# Patient Record
Sex: Female | Born: 1965 | Race: White | Hispanic: No | Marital: Single | Smoking: Current every day smoker
Health system: Southern US, Community
[De-identification: ages and names within clinical notes are randomized; demographics above are authoritative.]

## PROBLEM LIST (undated history)

## (undated) DIAGNOSIS — F419 Anxiety disorder, unspecified: Secondary | ICD-10-CM

## (undated) DIAGNOSIS — N809 Endometriosis, unspecified: Secondary | ICD-10-CM

## (undated) DIAGNOSIS — F329 Major depressive disorder, single episode, unspecified: Secondary | ICD-10-CM

## (undated) DIAGNOSIS — R160 Hepatomegaly, not elsewhere classified: Secondary | ICD-10-CM

## (undated) DIAGNOSIS — M1990 Unspecified osteoarthritis, unspecified site: Secondary | ICD-10-CM

## (undated) DIAGNOSIS — T148XXA Other injury of unspecified body region, initial encounter: Secondary | ICD-10-CM

## (undated) DIAGNOSIS — K602 Anal fissure, unspecified: Secondary | ICD-10-CM

## (undated) DIAGNOSIS — Z720 Tobacco use: Secondary | ICD-10-CM

## (undated) DIAGNOSIS — F410 Panic disorder [episodic paroxysmal anxiety] without agoraphobia: Secondary | ICD-10-CM

## (undated) DIAGNOSIS — E559 Vitamin D deficiency, unspecified: Secondary | ICD-10-CM

## (undated) DIAGNOSIS — E669 Obesity, unspecified: Secondary | ICD-10-CM

## (undated) DIAGNOSIS — G2581 Restless legs syndrome: Secondary | ICD-10-CM

## (undated) DIAGNOSIS — F32A Depression, unspecified: Secondary | ICD-10-CM

## (undated) DIAGNOSIS — R7303 Prediabetes: Secondary | ICD-10-CM

## (undated) DIAGNOSIS — F429 Obsessive-compulsive disorder, unspecified: Secondary | ICD-10-CM

## (undated) DIAGNOSIS — B029 Zoster without complications: Secondary | ICD-10-CM

## (undated) DIAGNOSIS — E782 Mixed hyperlipidemia: Secondary | ICD-10-CM

## (undated) DIAGNOSIS — M722 Plantar fascial fibromatosis: Secondary | ICD-10-CM

## (undated) DIAGNOSIS — E039 Hypothyroidism, unspecified: Secondary | ICD-10-CM

## (undated) HISTORY — DX: Mixed hyperlipidemia: E78.2

## (undated) HISTORY — PX: LEFT OOPHORECTOMY: SHX1961

## (undated) HISTORY — PX: TONSILLECTOMY: SUR1361

## (undated) HISTORY — DX: Unspecified osteoarthritis, unspecified site: M19.90

## (undated) HISTORY — PX: HEMORROIDECTOMY: SUR656

## (undated) HISTORY — DX: Vitamin D deficiency, unspecified: E55.9

## (undated) HISTORY — DX: Anxiety disorder, unspecified: F41.9

## (undated) HISTORY — DX: Panic disorder (episodic paroxysmal anxiety): F41.0

## (undated) HISTORY — DX: Endometriosis, unspecified: N80.9

## (undated) HISTORY — DX: Restless legs syndrome: G25.81

## (undated) HISTORY — DX: Obsessive-compulsive disorder, unspecified: F42.9

## (undated) HISTORY — DX: Anal fissure, unspecified: K60.2

## (undated) HISTORY — DX: Depression, unspecified: F32.A

## (undated) HISTORY — DX: Other injury of unspecified body region, initial encounter: T14.8XXA

## (undated) HISTORY — DX: Prediabetes: R73.03

## (undated) HISTORY — DX: Hepatomegaly, not elsewhere classified: R16.0

## (undated) HISTORY — DX: Major depressive disorder, single episode, unspecified: F32.9

## (undated) HISTORY — DX: Hypothyroidism, unspecified: E03.9

## (undated) HISTORY — DX: Zoster without complications: B02.9

## (undated) HISTORY — DX: Plantar fascial fibromatosis: M72.2

## (undated) HISTORY — DX: Tobacco use: Z72.0

## (undated) HISTORY — PX: CHOLECYSTECTOMY: SHX55

## (undated) HISTORY — DX: Obesity, unspecified: E66.9

---

## 1992-10-13 DIAGNOSIS — K602 Anal fissure, unspecified: Secondary | ICD-10-CM

## 1992-10-13 HISTORY — DX: Anal fissure, unspecified: K60.2

## 1999-06-03 ENCOUNTER — Other Ambulatory Visit: Admission: RE | Admit: 1999-06-03 | Discharge: 1999-06-14 | Payer: Self-pay

## 2000-06-17 ENCOUNTER — Emergency Department (HOSPITAL_COMMUNITY): Admission: EM | Admit: 2000-06-17 | Discharge: 2000-06-17 | Payer: Self-pay | Admitting: Emergency Medicine

## 2000-06-17 ENCOUNTER — Encounter: Payer: Self-pay | Admitting: Emergency Medicine

## 2000-07-03 ENCOUNTER — Ambulatory Visit (HOSPITAL_COMMUNITY): Admission: RE | Admit: 2000-07-03 | Discharge: 2000-07-03 | Payer: Self-pay | Admitting: Internal Medicine

## 2000-07-03 ENCOUNTER — Encounter: Payer: Self-pay | Admitting: Internal Medicine

## 2000-07-15 ENCOUNTER — Other Ambulatory Visit: Admission: RE | Admit: 2000-07-15 | Discharge: 2000-07-15 | Payer: Self-pay | Admitting: Obstetrics & Gynecology

## 2000-11-12 ENCOUNTER — Other Ambulatory Visit: Admission: RE | Admit: 2000-11-12 | Discharge: 2000-11-12 | Payer: Self-pay | Admitting: Obstetrics & Gynecology

## 2001-03-30 ENCOUNTER — Other Ambulatory Visit (HOSPITAL_COMMUNITY): Admission: RE | Admit: 2001-03-30 | Discharge: 2001-04-12 | Payer: Self-pay | Admitting: Psychiatry

## 2002-01-21 ENCOUNTER — Other Ambulatory Visit: Admission: RE | Admit: 2002-01-21 | Discharge: 2002-01-21 | Payer: Self-pay | Admitting: Obstetrics and Gynecology

## 2003-04-10 ENCOUNTER — Other Ambulatory Visit: Admission: RE | Admit: 2003-04-10 | Discharge: 2003-04-10 | Payer: Self-pay | Admitting: Obstetrics & Gynecology

## 2003-05-29 ENCOUNTER — Encounter: Payer: Self-pay | Admitting: Obstetrics & Gynecology

## 2003-05-29 ENCOUNTER — Encounter: Admission: RE | Admit: 2003-05-29 | Discharge: 2003-05-29 | Payer: Self-pay | Admitting: Obstetrics & Gynecology

## 2004-07-03 ENCOUNTER — Other Ambulatory Visit: Admission: RE | Admit: 2004-07-03 | Discharge: 2004-07-03 | Payer: Self-pay | Admitting: Obstetrics & Gynecology

## 2005-09-06 ENCOUNTER — Emergency Department (HOSPITAL_COMMUNITY): Admission: AD | Admit: 2005-09-06 | Discharge: 2005-09-06 | Payer: Self-pay | Admitting: Family Medicine

## 2006-12-25 LAB — HM DEXA SCAN: HM Dexa Scan: NORMAL

## 2007-03-11 ENCOUNTER — Ambulatory Visit: Payer: Self-pay | Admitting: Internal Medicine

## 2007-03-25 ENCOUNTER — Ambulatory Visit: Payer: Self-pay | Admitting: Internal Medicine

## 2007-03-25 HISTORY — PX: COLONOSCOPY: SHX174

## 2007-05-14 LAB — HM COLONOSCOPY: HM Colonoscopy: NORMAL

## 2007-07-14 ENCOUNTER — Ambulatory Visit (HOSPITAL_COMMUNITY): Admission: RE | Admit: 2007-07-14 | Discharge: 2007-07-14 | Payer: Self-pay | Admitting: Internal Medicine

## 2008-04-04 ENCOUNTER — Ambulatory Visit (HOSPITAL_BASED_OUTPATIENT_CLINIC_OR_DEPARTMENT_OTHER): Admission: RE | Admit: 2008-04-04 | Discharge: 2008-04-04 | Payer: Self-pay | Admitting: Obstetrics & Gynecology

## 2008-04-04 ENCOUNTER — Encounter: Payer: Self-pay | Admitting: Obstetrics & Gynecology

## 2008-07-24 ENCOUNTER — Other Ambulatory Visit: Admission: RE | Admit: 2008-07-24 | Discharge: 2008-07-24 | Payer: Self-pay | Admitting: Obstetrics & Gynecology

## 2009-02-15 ENCOUNTER — Ambulatory Visit (HOSPITAL_COMMUNITY): Admission: RE | Admit: 2009-02-15 | Discharge: 2009-02-15 | Payer: Self-pay | Admitting: Internal Medicine

## 2009-10-24 ENCOUNTER — Encounter: Admission: RE | Admit: 2009-10-24 | Discharge: 2009-10-24 | Payer: Self-pay | Admitting: Obstetrics & Gynecology

## 2010-02-18 ENCOUNTER — Observation Stay (HOSPITAL_COMMUNITY): Admission: EM | Admit: 2010-02-18 | Discharge: 2010-02-21 | Payer: Self-pay | Admitting: Emergency Medicine

## 2010-02-18 ENCOUNTER — Ambulatory Visit: Payer: Self-pay | Admitting: Cardiology

## 2010-03-13 ENCOUNTER — Encounter (INDEPENDENT_AMBULATORY_CARE_PROVIDER_SITE_OTHER): Payer: Self-pay | Admitting: Internal Medicine

## 2010-03-13 ENCOUNTER — Inpatient Hospital Stay (HOSPITAL_COMMUNITY): Admission: EM | Admit: 2010-03-13 | Discharge: 2010-03-15 | Payer: Self-pay | Admitting: Emergency Medicine

## 2010-03-13 ENCOUNTER — Ambulatory Visit: Payer: Self-pay | Admitting: Vascular Surgery

## 2010-11-03 ENCOUNTER — Encounter: Payer: Self-pay | Admitting: Obstetrics & Gynecology

## 2010-12-16 ENCOUNTER — Other Ambulatory Visit: Payer: Self-pay | Admitting: Obstetrics & Gynecology

## 2010-12-16 DIAGNOSIS — Z1231 Encounter for screening mammogram for malignant neoplasm of breast: Secondary | ICD-10-CM

## 2010-12-19 ENCOUNTER — Ambulatory Visit (HOSPITAL_COMMUNITY)
Admission: RE | Admit: 2010-12-19 | Discharge: 2010-12-19 | Disposition: A | Discharge status: Home / Self Care | Payer: Medicare Other | Source: Ambulatory Visit | Attending: Obstetrics & Gynecology | Admitting: Obstetrics & Gynecology

## 2010-12-19 ENCOUNTER — Ambulatory Visit (HOSPITAL_COMMUNITY): Payer: Medicare Other

## 2010-12-19 DIAGNOSIS — Z1231 Encounter for screening mammogram for malignant neoplasm of breast: Secondary | ICD-10-CM | POA: Insufficient documentation

## 2010-12-19 LAB — HM MAMMOGRAPHY: HM Mammogram: NEGATIVE

## 2010-12-30 LAB — CBC
Hemoglobin: 12.9 g/dL (ref 12.0–15.0)
MCHC: 34 g/dL (ref 30.0–36.0)
Platelets: 229 10*3/uL (ref 150–400)
WBC: 8.7 10*3/uL (ref 4.0–10.5)

## 2010-12-30 LAB — DIFFERENTIAL
Basophils Absolute: 0.1 10*3/uL (ref 0.0–0.1)
Eosinophils Relative: 13 % — ABNORMAL HIGH (ref 0–5)
Lymphocytes Relative: 36 % (ref 12–46)
Monocytes Absolute: 0.6 10*3/uL (ref 0.1–1.0)

## 2010-12-30 LAB — URINALYSIS, ROUTINE W REFLEX MICROSCOPIC
Glucose, UA: NEGATIVE mg/dL
Glucose, UA: NEGATIVE mg/dL
Nitrite: NEGATIVE
Protein, ur: NEGATIVE mg/dL
Protein, ur: NEGATIVE mg/dL
Specific Gravity, Urine: 1.002 — ABNORMAL LOW (ref 1.005–1.030)
Specific Gravity, Urine: 1.003 — ABNORMAL LOW (ref 1.005–1.030)
Urobilinogen, UA: 0.2 mg/dL (ref 0.0–1.0)
Urobilinogen, UA: 0.2 mg/dL (ref 0.0–1.0)
pH: 7 (ref 5.0–8.0)

## 2010-12-30 LAB — BASIC METABOLIC PANEL
BUN: 3 mg/dL — ABNORMAL LOW (ref 6–23)
BUN: 3 mg/dL — ABNORMAL LOW (ref 6–23)
CO2: 23 mEq/L (ref 19–32)
Calcium: 9 mg/dL (ref 8.4–10.5)
Calcium: 9 mg/dL (ref 8.4–10.5)
Calcium: 9.4 mg/dL (ref 8.4–10.5)
Creatinine, Ser: 0.89 mg/dL (ref 0.4–1.2)
GFR calc Af Amer: >60 mL/min (ref >60)
GFR calc Af Amer: >60 mL/min (ref >60)
GFR calc non Af Amer: >60 mL/min (ref >60)
Glucose, Bld: 94 mg/dL (ref 70–99)
Glucose, Bld: 94 mg/dL (ref 70–99)
Potassium: 4.1 mEq/L (ref 3.5–5.1)

## 2010-12-30 LAB — CARDIAC PANEL(CRET KIN+CKTOT+MB+TROPI)
CK, MB: 1.1 ng/mL (ref 0.3–4.0)
Relative Index: INVALID (ref 0.0–2.5)
Relative Index: INVALID (ref 0.0–2.5)
Total CK: 46 U/L (ref 7–177)
Troponin I: 0.01 ng/mL (ref 0.00–0.06)
Troponin I: 0.02 ng/mL (ref 0.00–0.06)

## 2010-12-30 LAB — LIPID PANEL
HDL: 46 mg/dL (ref >39)
LDL Cholesterol: 119 mg/dL — ABNORMAL HIGH (ref 0–99)
Total CHOL/HDL Ratio: 4 RATIO

## 2010-12-30 LAB — TROPONIN I: Troponin I: <0.01 ng/mL (ref 0.00–0.06)

## 2010-12-30 LAB — COMPREHENSIVE METABOLIC PANEL
AST: 22 U/L (ref 0–37)
Albumin: 3.5 g/dL (ref 3.5–5.2)
Chloride: 109 mEq/L (ref 96–112)
Creatinine, Ser: 0.67 mg/dL (ref 0.4–1.2)
GFR calc Af Amer: >60 mL/min (ref >60)
Potassium: 4.1 mEq/L (ref 3.5–5.1)
Total Bilirubin: 0.3 mg/dL (ref 0.3–1.2)

## 2010-12-30 LAB — SODIUM, URINE, RANDOM: Sodium, Ur: 16 mEq/L

## 2010-12-30 LAB — PREGNANCY, URINE

## 2010-12-30 LAB — CK TOTAL AND CKMB (NOT AT ARMC)
CK, MB: 0.8 ng/mL (ref 0.3–4.0)
CK, MB: 0.8 ng/mL (ref 0.3–4.0)
CK, MB: 0.9 ng/mL (ref 0.3–4.0)
Relative Index: INVALID (ref 0.0–2.5)
Relative Index: INVALID (ref 0.0–2.5)
Total CK: 71 U/L (ref 7–177)
Total CK: 74 U/L (ref 7–177)

## 2010-12-30 LAB — RAPID URINE DRUG SCREEN, HOSP PERFORMED
Amphetamines: NOT DETECTED
Barbiturates: NOT DETECTED
Tetrahydrocannabinol: NOT DETECTED

## 2010-12-30 LAB — TSH: TSH: 2.358 u[IU]/mL (ref 0.350–4.500)

## 2010-12-31 LAB — URINE CULTURE: Colony Count: NO GROWTH

## 2010-12-31 LAB — CBC
HCT: 33.1 % — ABNORMAL LOW (ref 36.0–46.0)
HCT: 33.9 % — ABNORMAL LOW (ref 36.0–46.0)
Hemoglobin: 11.3 g/dL — ABNORMAL LOW (ref 12.0–15.0)
Hemoglobin: 11.4 g/dL — ABNORMAL LOW (ref 12.0–15.0)
MCHC: 34 g/dL (ref 30.0–36.0)
MCHC: 35.1 g/dL (ref 30.0–36.0)
MCV: 95.8 fL (ref 78.0–100.0)
MCV: 96.4 fL (ref 78.0–100.0)
MCV: 97.1 fL (ref 78.0–100.0)
Platelets: 235 10*3/uL (ref 150–400)
Platelets: 267 10*3/uL (ref 150–400)
RDW: 13.6 % (ref 11.5–15.5)
RDW: 13.8 % (ref 11.5–15.5)
WBC: 7 10*3/uL (ref 4.0–10.5)

## 2010-12-31 LAB — URINALYSIS, ROUTINE W REFLEX MICROSCOPIC
Bilirubin Urine: NEGATIVE
Glucose, UA: NEGATIVE mg/dL
Specific Gravity, Urine: 1.001 — ABNORMAL LOW (ref 1.005–1.030)
Urobilinogen, UA: 0.2 mg/dL (ref 0.0–1.0)
pH: 7 (ref 5.0–8.0)

## 2010-12-31 LAB — BASIC METABOLIC PANEL
BUN: 4 mg/dL — ABNORMAL LOW (ref 6–23)
BUN: 8 mg/dL (ref 6–23)
CO2: 27 mEq/L (ref 19–32)
Chloride: 114 mEq/L — ABNORMAL HIGH (ref 96–112)
Chloride: 115 mEq/L — ABNORMAL HIGH (ref 96–112)
GFR calc non Af Amer: >60 mL/min (ref >60)
Glucose, Bld: 107 mg/dL — ABNORMAL HIGH (ref 70–99)
Potassium: 4 mEq/L (ref 3.5–5.1)
Potassium: 4.4 mEq/L (ref 3.5–5.1)
Sodium: 144 mEq/L (ref 135–145)
Sodium: 144 mEq/L (ref 135–145)

## 2010-12-31 LAB — MAGNESIUM: Magnesium: 2.2 mg/dL (ref 1.5–2.5)

## 2010-12-31 LAB — DIFFERENTIAL
Basophils Absolute: 0 10*3/uL (ref 0.0–0.1)
Eosinophils Absolute: 0.9 10*3/uL — ABNORMAL HIGH (ref 0.0–0.7)
Lymphocytes Relative: 19 % (ref 12–46)
Lymphs Abs: 1.7 10*3/uL (ref 0.7–4.0)
Neutrophils Relative %: 66 % (ref 43–77)

## 2010-12-31 LAB — TYPE AND SCREEN
ABO/RH(D): O POS
Antibody Screen: NEGATIVE

## 2010-12-31 LAB — LIPID PANEL
Cholesterol: 165 mg/dL (ref 0–200)
LDL Cholesterol: 106 mg/dL — ABNORMAL HIGH (ref 0–99)
Total CHOL/HDL Ratio: 4.6 RATIO
Triglycerides: 116 mg/dL (ref <150)
VLDL: 23 mg/dL (ref 0–40)

## 2010-12-31 LAB — COMPREHENSIVE METABOLIC PANEL
CO2: 24 mEq/L (ref 19–32)
Calcium: 8.5 mg/dL (ref 8.4–10.5)
Creatinine, Ser: 0.84 mg/dL (ref 0.4–1.2)
GFR calc non Af Amer: >60 mL/min (ref >60)
Glucose, Bld: 95 mg/dL (ref 70–99)
Total Bilirubin: 0.5 mg/dL (ref 0.3–1.2)

## 2010-12-31 LAB — LIPASE, BLOOD: Lipase: 26 U/L (ref 11–59)

## 2010-12-31 LAB — CARDIAC PANEL(CRET KIN+CKTOT+MB+TROPI)
CK, MB: 0.8 ng/mL (ref 0.3–4.0)
Total CK: 69 U/L (ref 7–177)

## 2010-12-31 LAB — CK TOTAL AND CKMB (NOT AT ARMC): Relative Index: INVALID (ref 0.0–2.5)

## 2010-12-31 LAB — URINE MICROSCOPIC-ADD ON

## 2010-12-31 LAB — D-DIMER, QUANTITATIVE: D-Dimer, Quant: 0.33 ug/mL-FEU (ref 0.00–0.48)

## 2011-02-25 NOTE — Op Note (Signed)
Susan Bartlett, Susan Bartlett                  ACCOUNT NO.:  [id number]   MEDICAL RECORD NO.:  [id number]          PATIENT TYPE:  AMB   LOCATION:  [facility]                         FACILITY:  [facility]   PHYSICIAN:  M. Leda Quail, MD  DATE OF BIRTH:  17-Dec-1965   DATE OF PROCEDURE:  04/04/2008  DATE OF DISCHARGE:                               OPERATIVE REPORT   PREOPERATIVE DIAGNOSES:  1. A 45 year old G1 P1, divorced white female with history of left 3-4      cm endometrioma.  2. Pelvic pain.  3. Back pain.  4. Smoking history.  5. Anxiety.   POSTOPERATIVE DIAGNOSES:  3. A 45 year old G1 P1, divorced white female with history of left 3-4      cm endometrioma.  2. Pelvic pain.  3. Back pain.  4. Smoking history.  5. Anxiety.   PROCEDURE:  Laparoscopic left salpingo-oophorectomy.   SURGEON:  M. Leda Quail, MD   ASSISTANT:  Edwena Felty. Romine, M.D.   ANESTHESIA:  General endotracheal.   FINDINGS:  Left endometrium with adhesions to the sidewall to the  uterus.   SPECIMENS:  Left tube and ovary sent to pathology.   ESTIMATED BLOOD LOSS:  Minimal.   FLUIDS:  1800 mL of LR.   URINE OUTPUT:  250 mL of clear urine drained in Foley catheter.   COMPLICATIONS:  None.   INDICATIONS:  Susan Bartlett is a very nice 45 year old G1, P1 divorced white  female who has a history of left lower quadrant pain that is  particularly worse before her cycles.  She was evaluated with an  ultrasound that did show an endometrioma.  She had a CA-125 obtained  which was not elevated.  She initially did not want surgery and so has  been followed conservatively.  However, every month since then she has  had worsening back pain and pelvic pain before her cycles.  This does  improve after her cycle starts; however, the left lower quadrant pelvic  pain has continued.  She is getting where she just can not tolerate any  more and has decided to proceed with surgery.  Risks and benefits have  explained to the  patient and these are all documented in her office  chart.  She is here for procedure today.   PROCEDURE:  Patient taken to operating room.  She is placed in supine  position.  General endotracheal anesthesia administered by the  anesthesia staff without difficulty.  Legs are positioned in the low  lithotomy position in [place] stirrups.  The right arm and the left arm  was left out.  Abdomen, perineum, inner thighs and vagina are prepped  and draped in normal sterile fashion.  Foley catheter is inserted in the  bladder under sterile conditions.  A bivalve speculum is placed in the  vagina.  Anterior lip of the cervix was grasped single-tooth tenaculum.  A Hulka clamp was passed through the cervical canal and attached to the  anterior lip of the cervix as a means to manipulate the uterus during  the procedure.  The tenaculum is removed from  the cervix.  Sterile gown  and gloves changed and attention turned to the abdomen.  4 mL of 0.25%  Marcaine is instilled beneath the umbilicus.  Subcu fat and tissue was  dissected with a hemostat.  The fascia is identified.  The abdominal  wall was elevated and the Veress needle was aimed toward the pelvis  below the level of bifurcation of the aorta.  The peritoneum was popped  through.  A syringe of normal saline was attached to the Veress needle.  Aspiration was performed without blood or fluid being noted.  Fluid  passes easily and no fluid was obtained with a second aspiration.  Fluid  dripped easily in the pelvis.  CO2 gas was attached to the Veress needle  and pneumoperitoneum is achieved under low pressures.  Once 2.5 L of gas  were in the abdomen, the Veress needle was removed.  Then a OptiVu non-  bladed trocar port obtained.  The laparoscope was attached.  Aiming  toward the pelvis, the abdominal wall layers were traversed.  Once  intraperitoneal placement was noted, the trocar was removed.  The  laparoscope was then passed and  intraperitoneal placement is indeed  noted.  Patient was placed in Trendelenberg positioning at this time.  Placement for a 5 mm right and left lower quadrant ports chosen by  transilluminating the abdominal wall and looking for vasculature.  The  skin was anesthetized with 0.25% Marcaine and skin incision made with  #11 blade.  Then a right and left lower quadrant trocar and port are  inserted under direct visualization of the laparoscope.  A non bladed  trocars were used.  At this point the uterus was elevated and the pelvis  was visualized.  There is a small amount of endometriosis on the right  uterosacral ligament.  The right ovary is completely normal.  The right  fallopian tube is normal.  The uterus otherwise appears normal except on  the left side where the endometrium is present.  It is adhesed with some  filmy adhesions to the uterus and then some more dense adhesions to the  pelvic side wall inferior to the IP ligament.  The anterior cul-de-sac  appears normal, posterior cul-de-sac is without any findings.  The upper  abdomen surveyed and no abnormal findings were noted.  She is status  post cholecystectomy.  Initially using a sharp dissection, the filmy  adhesions are taken down connecting the ovary to the uterus.  Thi is  done using Endoshears.  The ovary at this point was manipulated with a  blunt probe and the endometrioma was ruptured accidentally.  Once the  endometriotic fluid the chocolate cyst was drained, the ovary is more  flexible.  What appeared to be dense adhesions actually could be taken  down just with some blunt dissection, peeling the ovary off of the  peritoneum where it was connected inferiorly.  The location of the colon  is clearly identified at this point and was not anywhere near the area  of surgical dissection.  The ureter is noted beneath the IP ligament and  at this point is out of the area where dissection will be performed.  Using a tripolar  Gyrus, the IP ligament is elevated, serially clamped,  cauterized with a sound indicator being used and then transected until  IP ligament is completely freed.  The area where the ureter was  identified is definitely below this area.  The ovary at this point was  just attached  to the uterus via the uterine ovarian pedicle.  The  tripolar Gyrus was used to serially clamp, cauterize and transect across  this pedicle.  At this point there is a small amount of bleeding on one  area of adhesion.  This is made hemostatic using monopolar cautery with  just one touch of the EndoShears tips.  The area of endometriosis on the  right uterosacral ligament is cauterized after the ureter was identified  on the right side.  The peritoneum was opened on the left side when the  IP ligament was traversed.  Therefore the ureter could not clearly be  seen peristalsing after the ovary was removed.  However I felt very  comfortable that it was below the level of the dissection and I did not  feel that needed to dissect into the retroperitoneum to identify the  ureter as I felt it was below the level of the field of surgery.  The  pelvis was irrigated.  No bleeding was noted.  Interceed was placed over  the area of surgery to help prevent any adhesion formation on the left  side of the uterus.  At this point the procedure was ended.  All  instruments and ports removed under direct visualization of the  laparoscope.  Laparoscope was removed and the pneumoperitoneum was  relieved.  Anesthesia gave the patient several deep breaths to help  relieve the intra-abdominal air.  The trocar in the middle was removed.  The patient was positioned back in supine position and taken out of  Trendelenburg.  The fascia was closed in the midline with figure-of-  eight suture of #0 Vicryl.  The skin was closed at the umbilicus with  subcuticular stitch of 3-0 Vicryl.  The skin was cleansed and Dermabond  was used to close the two  inferior incisions to make the midline  incision air tight.   Sponge, lap, needle and instrument counts were correct x2.  The patient  tolerated procedure well.  She was awakened from anesthesia, extubated,  taken to recovery room in stable condition.      Lum Keas, MD  Electronically Signed     MSM/MEDQ  D:  04/04/2008  T:  04/04/2008  Job:  [id number]

## 2011-07-10 LAB — BASIC METABOLIC PANEL
BUN: 5 — ABNORMAL LOW
CO2: 28
Calcium: 9.3
Creatinine, Ser: 0.84
Glucose, Bld: 87
Sodium: 138

## 2011-07-10 LAB — CBC
Hemoglobin: 14.9
MCHC: 33.8
Platelets: 271
RDW: 14.3

## 2011-07-10 LAB — URINALYSIS, ROUTINE W REFLEX MICROSCOPIC
Glucose, UA: NEGATIVE
Ketones, ur: NEGATIVE
Protein, ur: NEGATIVE
Urobilinogen, UA: 0.2

## 2011-07-14 ENCOUNTER — Other Ambulatory Visit (HOSPITAL_COMMUNITY): Payer: Self-pay | Admitting: Internal Medicine

## 2011-07-14 ENCOUNTER — Ambulatory Visit (HOSPITAL_COMMUNITY)
Admission: RE | Admit: 2011-07-14 | Discharge: 2011-07-14 | Disposition: A | Discharge status: Home / Self Care | Payer: Medicare Other | Source: Ambulatory Visit | Attending: Internal Medicine | Admitting: Internal Medicine

## 2011-07-14 DIAGNOSIS — R0602 Shortness of breath: Secondary | ICD-10-CM

## 2011-07-14 DIAGNOSIS — R05 Cough: Secondary | ICD-10-CM | POA: Insufficient documentation

## 2011-07-14 DIAGNOSIS — R1012 Left upper quadrant pain: Secondary | ICD-10-CM

## 2011-07-14 DIAGNOSIS — R059 Cough, unspecified: Secondary | ICD-10-CM | POA: Insufficient documentation

## 2011-08-14 DIAGNOSIS — T148XXA Other injury of unspecified body region, initial encounter: Secondary | ICD-10-CM

## 2011-08-14 HISTORY — DX: Other injury of unspecified body region, initial encounter: T14.8XXA

## 2011-11-25 ENCOUNTER — Other Ambulatory Visit: Payer: Self-pay | Admitting: Obstetrics & Gynecology

## 2011-11-25 DIAGNOSIS — R1012 Left upper quadrant pain: Secondary | ICD-10-CM

## 2011-11-26 ENCOUNTER — Other Ambulatory Visit: Payer: Self-pay | Admitting: Internal Medicine

## 2011-12-01 ENCOUNTER — Other Ambulatory Visit: Payer: Medicare Other

## 2011-12-04 ENCOUNTER — Ambulatory Visit
Admission: RE | Admit: 2011-12-04 | Discharge: 2011-12-04 | Disposition: A | Discharge status: Home / Self Care | Payer: Medicare Other | Source: Ambulatory Visit | Attending: Obstetrics & Gynecology | Admitting: Obstetrics & Gynecology

## 2011-12-04 DIAGNOSIS — R1012 Left upper quadrant pain: Secondary | ICD-10-CM

## 2011-12-04 MED ORDER — IOHEXOL 300 MG/ML  SOLN
100.0000 mL | Freq: Once | INTRAMUSCULAR | Status: AC | PRN
  Administered 2011-12-04: 100 mL via INTRAVENOUS

## 2011-12-23 ENCOUNTER — Encounter: Payer: Self-pay | Admitting: Internal Medicine

## 2012-01-02 ENCOUNTER — Encounter: Payer: Self-pay | Admitting: Internal Medicine

## 2012-01-02 ENCOUNTER — Ambulatory Visit (INDEPENDENT_AMBULATORY_CARE_PROVIDER_SITE_OTHER): Payer: Medicare Other | Admitting: Internal Medicine

## 2012-01-02 VITALS — BP 108/70 | HR 88 | Ht 68.0 in | Wt 226.2 lb

## 2012-01-02 DIAGNOSIS — R160 Hepatomegaly, not elsewhere classified: Secondary | ICD-10-CM

## 2012-01-02 DIAGNOSIS — R0789 Other chest pain: Secondary | ICD-10-CM

## 2012-01-02 DIAGNOSIS — R071 Chest pain on breathing: Secondary | ICD-10-CM

## 2012-01-02 NOTE — Patient Instructions (Signed)
Dr. Leone Payor doesn't feel that there is any sufficient problem with your liver. You should here from our office about your upcoming colonoscopy due June 2013. Please follow-up with Dr. Oneta Rack regarding your chest wall pain.
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
## 2012-01-05 ENCOUNTER — Emergency Department (HOSPITAL_COMMUNITY)
Admission: EM | Admit: 2012-01-05 | Discharge: 2012-01-05 | Disposition: A | Discharge status: Home / Self Care | Payer: Medicare Other | Attending: Emergency Medicine | Admitting: Emergency Medicine

## 2012-01-05 ENCOUNTER — Emergency Department (HOSPITAL_COMMUNITY): Payer: Medicare Other

## 2012-01-05 ENCOUNTER — Other Ambulatory Visit: Payer: Self-pay

## 2012-01-05 ENCOUNTER — Encounter (HOSPITAL_COMMUNITY): Payer: Self-pay | Admitting: *Deleted

## 2012-01-05 DIAGNOSIS — R42 Dizziness and giddiness: Secondary | ICD-10-CM | POA: Insufficient documentation

## 2012-01-05 DIAGNOSIS — F439 Reaction to severe stress, unspecified: Secondary | ICD-10-CM

## 2012-01-05 DIAGNOSIS — F341 Dysthymic disorder: Secondary | ICD-10-CM | POA: Insufficient documentation

## 2012-01-05 DIAGNOSIS — E785 Hyperlipidemia, unspecified: Secondary | ICD-10-CM | POA: Insufficient documentation

## 2012-01-05 DIAGNOSIS — H9209 Otalgia, unspecified ear: Secondary | ICD-10-CM | POA: Insufficient documentation

## 2012-01-05 DIAGNOSIS — E669 Obesity, unspecified: Secondary | ICD-10-CM | POA: Insufficient documentation

## 2012-01-05 DIAGNOSIS — Z639 Problem related to primary support group, unspecified: Secondary | ICD-10-CM | POA: Insufficient documentation

## 2012-01-05 LAB — DIFFERENTIAL
Eosinophils Absolute: 1 10*3/uL — ABNORMAL HIGH (ref 0.0–0.7)
Lymphs Abs: 3 10*3/uL (ref 0.7–4.0)
Monocytes Absolute: 0.5 10*3/uL (ref 0.1–1.0)
Monocytes Relative: 6 % (ref 3–12)
Neutrophils Relative %: 48 % (ref 43–77)

## 2012-01-05 LAB — URINALYSIS, ROUTINE W REFLEX MICROSCOPIC
Glucose, UA: NEGATIVE mg/dL
Ketones, ur: NEGATIVE mg/dL
Leukocytes, UA: NEGATIVE
pH: 6.5 (ref 5.0–8.0)

## 2012-01-05 LAB — POCT I-STAT, CHEM 8
Creatinine, Ser: 0.8 mg/dL (ref 0.50–1.10)
Glucose, Bld: 88 mg/dL (ref 70–99)
Hemoglobin: 14.3 g/dL (ref 12.0–15.0)
Potassium: 4 mEq/L (ref 3.5–5.1)

## 2012-01-05 LAB — CBC
HCT: 39.3 % (ref 36.0–46.0)
Hemoglobin: 13.7 g/dL (ref 12.0–15.0)
MCH: 31.1 pg (ref 26.0–34.0)
MCV: 89.3 fL (ref 78.0–100.0)
RBC: 4.4 MIL/uL (ref 3.87–5.11)

## 2012-01-05 LAB — POCT I-STAT TROPONIN I: Troponin i, poc: 0 ng/mL (ref 0.00–0.08)

## 2012-01-05 MED ORDER — MECLIZINE HCL 25 MG PO TABS
12.5000 mg | ORAL_TABLET | Freq: Once | ORAL | Status: AC
  Administered 2012-01-05: 12.5 mg via ORAL
  Filled 2012-01-05: qty 1

## 2012-01-05 MED ORDER — MECLIZINE HCL 50 MG PO TABS: 25.0000 mg | ORAL_TABLET | Freq: Three times a day (TID) | ORAL | Status: AC | PRN

## 2012-01-05 MED ORDER — SODIUM CHLORIDE 0.9 % IV BOLUS (SEPSIS)
1000.0000 mL | Freq: Once | INTRAVENOUS | Status: AC
  Administered 2012-01-05: 1000 mL via INTRAVENOUS
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
## 2012-01-05 NOTE — Discharge Instructions (Signed)

## 2012-01-05 NOTE — ED Notes (Signed)
Back from xray and CT
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
## 2012-01-12 ENCOUNTER — Encounter: Payer: Self-pay | Admitting: Internal Medicine

## 2012-02-09 ENCOUNTER — Encounter: Payer: Self-pay | Admitting: Internal Medicine

## 2012-04-21 ENCOUNTER — Other Ambulatory Visit: Payer: Self-pay | Admitting: Internal Medicine

## 2012-04-21 DIAGNOSIS — R0602 Shortness of breath: Secondary | ICD-10-CM

## 2012-04-22 ENCOUNTER — Ambulatory Visit
Admission: RE | Admit: 2012-04-22 | Discharge: 2012-04-22 | Disposition: A | Discharge status: Home / Self Care | Payer: Medicare Other | Source: Ambulatory Visit | Attending: Internal Medicine | Admitting: Internal Medicine

## 2012-04-22 DIAGNOSIS — R0602 Shortness of breath: Secondary | ICD-10-CM

## 2012-10-13 DIAGNOSIS — G2581 Restless legs syndrome: Secondary | ICD-10-CM

## 2012-10-13 HISTORY — DX: Restless legs syndrome: G25.81

## 2012-11-18 ENCOUNTER — Encounter: Payer: Self-pay | Admitting: Cardiovascular Disease

## 2012-12-06 ENCOUNTER — Encounter (HOSPITAL_COMMUNITY): Payer: Medicare Other

## 2012-12-06 DIAGNOSIS — F325 Major depressive disorder, single episode, in full remission: Secondary | ICD-10-CM | POA: Insufficient documentation

## 2012-12-06 DIAGNOSIS — E039 Hypothyroidism, unspecified: Secondary | ICD-10-CM

## 2012-12-06 DIAGNOSIS — K602 Anal fissure, unspecified: Secondary | ICD-10-CM

## 2012-12-06 DIAGNOSIS — E669 Obesity, unspecified: Secondary | ICD-10-CM | POA: Insufficient documentation

## 2012-12-06 DIAGNOSIS — E782 Mixed hyperlipidemia: Secondary | ICD-10-CM

## 2012-12-06 DIAGNOSIS — F410 Panic disorder [episodic paroxysmal anxiety] without agoraphobia: Secondary | ICD-10-CM

## 2012-12-06 DIAGNOSIS — R160 Hepatomegaly, not elsewhere classified: Secondary | ICD-10-CM | POA: Insufficient documentation

## 2012-12-07 ENCOUNTER — Encounter: Payer: Self-pay | Admitting: Cardiovascular Disease

## 2012-12-07 ENCOUNTER — Ambulatory Visit (INDEPENDENT_AMBULATORY_CARE_PROVIDER_SITE_OTHER): Payer: Medicare Other | Admitting: Cardiovascular Disease

## 2012-12-07 VITALS — BP 134/76 | HR 71 | Ht 68.0 in | Wt 232.0 lb

## 2012-12-07 DIAGNOSIS — E782 Mixed hyperlipidemia: Secondary | ICD-10-CM

## 2012-12-07 DIAGNOSIS — R079 Chest pain, unspecified: Secondary | ICD-10-CM

## 2012-12-07 DIAGNOSIS — R002 Palpitations: Secondary | ICD-10-CM

## 2012-12-07 DIAGNOSIS — R0600 Dyspnea, unspecified: Secondary | ICD-10-CM

## 2012-12-07 DIAGNOSIS — F17200 Nicotine dependence, unspecified, uncomplicated: Secondary | ICD-10-CM

## 2012-12-07 NOTE — Assessment & Plan Note (Signed)
Cholesterol is at goal.  Continue current dose of statin and diet Rx.  No myalgias or side effects.  F/U  LFT's in 6 months. Lab Results  Component Value Date   LDLCALC  Value: 119        Total Cholesterol/HDL:CHD Risk Coronary Heart Disease Risk Table                     Men   Women  1/2 Average Risk   3.4   3.3  Average Risk       5.0   4.4  2 X Average Risk   9.6   7.1  3 X Average Risk  23.4   11.0        Use the calculated Patient Ratio above and the CHD Risk Table to determine the patient's CHD Risk.        ATP III CLASSIFICATION (LDL):  <100     mg/dL   Optimal  295-621  mg/dL   Near or Above                    Optimal  130-159  mg/dL   Borderline  308-657  mg/dL   High  >846     mg/dL   Very High* 06/19/2951

## 2012-12-07 NOTE — Assessment & Plan Note (Signed)
Smoker will check CXR and echo portion of stress test for LV function

## 2012-12-07 NOTE — Patient Instructions (Signed)
Your physician recommends that you schedule a follow-up appointment in: AS NEEDED  A chest x-ray takes a picture of the organs and structures inside the chest, including the heart, lungs, and blood vessels. This test can show several things, including, whether the heart is enlarges; whether fluid is building up in the lungs; and whether pacemaker / defibrillator leads are still in place. AT [facility] OFFICE  Your physician has requested that you have a stress echocardiogram. For further information please visit [email or web address] Please follow instruction sheet as given.

## 2012-12-07 NOTE — Assessment & Plan Note (Signed)
Atypical with normal ECG  Since she has dyspnea as well do stress echo

## 2012-12-07 NOTE — Progress Notes (Signed)
Patient ID: Susan Bartlett, female   DOB: 1966/01/28, 47 y.o.   MRN: [id number] 47 yo sedentary smoker.  Having atypical chest pain and more dyspnea.  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  Cannot tolerate Chantix.  Pains in chest radiate to shoulder and back.  Not always exertional.  Started a few weeks ago and are persistant.  No alleviating factors No GI overtones. Has not had stress test before.  No clinical diagnosis of COPD  Has not had a CXR this year. CRF;s family history smoking and elevated lipids  ROS: Denies fever, malais, weight loss, blurry vision, decreased visual acuity, cough, sputum, SOB, hemoptysis, pleuritic pain, palpitaitons, heartburn, abdominal pain, melena, lower extremity edema, claudication, or rash.  All other systems reviewed and negative   General: Affect appropriate Obese white female with nicotine smell HEENT: normal Neck supple with no adenopathy JVP normal no bruits no thyromegaly Lungs clear with no wheezing and good diaphragmatic motion Heart:  S1/S2 no murmur,rub, gallop or click PMI normal Abdomen: benighn, BS positve, no tenderness, no AAA no bruit.  No HSM or HJR Distal pulses intact with no bruits No edema Neuro non-focal Skin warm and dry No muscular weakness  Medications Current Outpatient Prescriptions  Medication Sig Dispense Refill  . clonazePAM (KLONOPIN) 1 MG tablet Take 0.5-1 mg by mouth 3 (three) times daily. Take 1/2 by mouth every am, 1/2 at lunch, and 1 every night      . gabapentin (NEURONTIN) 100 MG capsule Take 200 mg by mouth 2 (two) times daily.      Marland Kitchen ibuprofen (ADVIL,MOTRIN) 200 MG tablet Take 200 mg by mouth every 6 (six) hours as needed.      . pravastatin (PRAVACHOL) 40 MG tablet Take 40 mg by mouth daily.      . sertraline (ZOLOFT) 100 MG tablet Take 150 mg by mouth daily.       No current facility-administered medications for this visit.    Allergies Review of patient's allergies indicates  no known allergies.  Family History: Family History  Problem Relation Age of Onset  . Colon cancer Maternal Grandmother 50  . Breast cancer Mother   . Drug abuse Brother     and alcohol  . Cirrhosis Father     alcohol  . Heart disease Father     MI  . Diabetes Mother   . Alcohol abuse Sister     Social History: History   Social History  . Marital Status: Single    Spouse Name: N/A    Number of Children: 1  . Years of Education: N/A   Occupational History  . disabilty [employer]   Social History Main Topics  . Smoking status: Current Every Day Smoker    Types: Cigarettes  . Smokeless tobacco: Never Used  . Alcohol Use: No  . Drug Use: No  . Sexually Active: No   Other Topics Concern  . Not on file   Social History Narrative  . No narrative on file    Electrocardiogram:  01/05/13  SR rate 69 normal  Assessment and Plan

## 2012-12-10 ENCOUNTER — Ambulatory Visit (INDEPENDENT_AMBULATORY_CARE_PROVIDER_SITE_OTHER)
Admission: RE | Admit: 2012-12-10 | Discharge: 2012-12-10 | Disposition: A | Discharge status: Home / Self Care | Payer: Medicare Other | Source: Ambulatory Visit | Attending: Cardiovascular Disease | Admitting: Cardiovascular Disease

## 2012-12-10 DIAGNOSIS — F17200 Nicotine dependence, unspecified, uncomplicated: Secondary | ICD-10-CM

## 2012-12-10 DIAGNOSIS — R0600 Dyspnea, unspecified: Secondary | ICD-10-CM

## 2012-12-14 ENCOUNTER — Other Ambulatory Visit: Payer: Self-pay | Admitting: *Deleted

## 2012-12-14 ENCOUNTER — Telehealth: Payer: Self-pay | Admitting: Cardiovascular Disease

## 2012-12-14 NOTE — Telephone Encounter (Signed)
Pt notified of normal cxr.

## 2012-12-15 ENCOUNTER — Other Ambulatory Visit (HOSPITAL_COMMUNITY): Payer: Medicare Other

## 2012-12-22 ENCOUNTER — Ambulatory Visit (HOSPITAL_COMMUNITY): Payer: Medicare Other

## 2012-12-23 ENCOUNTER — Other Ambulatory Visit: Payer: Self-pay | Admitting: *Deleted

## 2012-12-23 DIAGNOSIS — R079 Chest pain, unspecified: Secondary | ICD-10-CM

## 2012-12-23 DIAGNOSIS — R0600 Dyspnea, unspecified: Secondary | ICD-10-CM

## 2012-12-29 ENCOUNTER — Encounter: Payer: Self-pay | Admitting: Nurse Practitioner

## 2012-12-29 ENCOUNTER — Ambulatory Visit (INDEPENDENT_AMBULATORY_CARE_PROVIDER_SITE_OTHER): Payer: Medicare Other | Admitting: Nurse Practitioner

## 2012-12-29 VITALS — BP 110/79 | HR 78

## 2012-12-29 DIAGNOSIS — R0609 Other forms of dyspnea: Secondary | ICD-10-CM

## 2012-12-29 DIAGNOSIS — R079 Chest pain, unspecified: Secondary | ICD-10-CM

## 2012-12-29 NOTE — Patient Instructions (Addendum)
We will send you for a breathing test  Let's get your ultrasound of your heart rescheduled - we will call you with this result  Try to work on your smoking  Walking every day - start at 5 min and add a minute a day as tolerated with a goal of 45 minutes per day

## 2012-12-29 NOTE — Progress Notes (Signed)
Exercise Treadmill Test  Pre-Exercise Testing Evaluation Rhythm: normal sinus  Rate: 92                 Test  Exercise Tolerance Test Ordering MD: Charlton Haws, MD  Interpreting MD: Norma Fredrickson, NP  Unique Test No: 1  Treadmill:  1  Indication for ETT: chest pain - rule out ischemia  Contraindication to ETT: No   Stress Modality: exercise - treadmill  Cardiac Imaging Performed: non   Protocol: standard Bruce - maximal  Max BP:  149/69  Max MPHR (bpm):  172 85% MPR (bpm):  146  MPHR obtained (bpm):  157 % MPHR obtained:  91%  Reached 85% MPHR (min:sec):  3:44 Total Exercise Time (min-sec):  4:30  Workload in METS:  4.3 Borg Scale: 18  Reason ETT Terminated:  patient's desire to stop    ST Segment Analysis At Rest: normal ST segments - no evidence of significant ST depression With Exercise: no evidence of significant ST depression  Other Information Arrhythmia:  No Angina during ETT:  absent (0) Quality of ETT:  diagnostic  ETT Interpretation:  normal - no evidence of ischemia by ST analysis  Comments: Patient presents today for routine GXT. Has had dyspnea and chest pain. Has strong family history of CAD with HLD and ongoing tobacco abuse in the setting of obesity and sedentary lifestyle.   Today, she exercised on the standard Bruce protocol for a total of 4:30. She has poor exercise tolerance. Blood pressure response was adequate. Clinically the patient was short of breath - oxygen sats were 95 to 97% with exercise. No chest pain. EKG negative for ischemia. No arrhythmia.   Recommendations: Proceed with echo.  Modify CV risk factors with walking program, weight loss, smoking cessation - this was discussed in depth with her Refer for PFT's  Further disposition to follow.  Patient is agreeable to this plan and will call if any problems develop in the interim.   Rosalio Macadamia, RN, ANP-C [facility] [address] [place], [place]   [address]

## 2013-01-05 ENCOUNTER — Ambulatory Visit (HOSPITAL_COMMUNITY): Payer: Medicare Other | Attending: Cardiovascular Disease | Admitting: Radiology

## 2013-01-05 DIAGNOSIS — R0609 Other forms of dyspnea: Secondary | ICD-10-CM | POA: Insufficient documentation

## 2013-01-05 DIAGNOSIS — E785 Hyperlipidemia, unspecified: Secondary | ICD-10-CM | POA: Insufficient documentation

## 2013-01-05 DIAGNOSIS — E669 Obesity, unspecified: Secondary | ICD-10-CM | POA: Insufficient documentation

## 2013-01-05 DIAGNOSIS — R0600 Dyspnea, unspecified: Secondary | ICD-10-CM

## 2013-01-05 DIAGNOSIS — R0989 Other specified symptoms and signs involving the circulatory and respiratory systems: Secondary | ICD-10-CM | POA: Insufficient documentation

## 2013-01-05 DIAGNOSIS — F17200 Nicotine dependence, unspecified, uncomplicated: Secondary | ICD-10-CM | POA: Insufficient documentation

## 2013-01-05 DIAGNOSIS — R002 Palpitations: Secondary | ICD-10-CM | POA: Insufficient documentation

## 2013-01-05 DIAGNOSIS — R079 Chest pain, unspecified: Secondary | ICD-10-CM

## 2013-01-05 DIAGNOSIS — R0602 Shortness of breath: Secondary | ICD-10-CM

## 2013-01-05 NOTE — Progress Notes (Signed)
Echocardiogram performed.  

## 2013-01-12 ENCOUNTER — Encounter: Payer: Self-pay | Admitting: Internal Medicine

## 2013-01-31 ENCOUNTER — Telehealth: Payer: Self-pay | Admitting: Obstetrics & Gynecology

## 2013-01-31 ENCOUNTER — Ambulatory Visit (INDEPENDENT_AMBULATORY_CARE_PROVIDER_SITE_OTHER): Payer: Medicare Other | Admitting: Internal Medicine

## 2013-01-31 DIAGNOSIS — R0609 Other forms of dyspnea: Secondary | ICD-10-CM

## 2013-01-31 DIAGNOSIS — R0989 Other specified symptoms and signs involving the circulatory and respiratory systems: Secondary | ICD-10-CM

## 2013-01-31 DIAGNOSIS — R0600 Dyspnea, unspecified: Secondary | ICD-10-CM

## 2013-01-31 LAB — PULMONARY FUNCTION TEST

## 2013-01-31 NOTE — Telephone Encounter (Signed)
Patient returning call.

## 2013-01-31 NOTE — Progress Notes (Signed)
PFT done today. 

## 2013-02-03 ENCOUNTER — Encounter: Payer: Self-pay | Admitting: Obstetrics & Gynecology

## 2013-02-03 ENCOUNTER — Ambulatory Visit (INDEPENDENT_AMBULATORY_CARE_PROVIDER_SITE_OTHER): Payer: Medicare Other | Admitting: Obstetrics & Gynecology

## 2013-02-03 ENCOUNTER — Encounter: Payer: Self-pay | Admitting: *Deleted

## 2013-02-03 VITALS — BP 116/68 | Ht 67.0 in | Wt 229.0 lb

## 2013-02-03 DIAGNOSIS — Z01419 Encounter for gynecological examination (general) (routine) without abnormal findings: Secondary | ICD-10-CM

## 2013-02-03 DIAGNOSIS — E039 Hypothyroidism, unspecified: Secondary | ICD-10-CM

## 2013-02-03 DIAGNOSIS — Z0000 Encounter for general adult medical examination without abnormal findings: Secondary | ICD-10-CM

## 2013-02-03 DIAGNOSIS — Z124 Encounter for screening for malignant neoplasm of cervix: Secondary | ICD-10-CM

## 2013-02-03 LAB — POCT URINALYSIS DIPSTICK
Blood, UA: NEGATIVE
Glucose, UA: NEGATIVE
Nitrite, UA: NEGATIVE
Urobilinogen, UA: NEGATIVE
pH, UA: 5

## 2013-02-03 LAB — THYROID PANEL WITH TSH
T3 Uptake: 26.7 % (ref 22.5–37.0)
T4, Total: 8.7 ug/dL (ref 5.0–12.5)
TSH: 2.955 u[IU]/mL (ref 0.350–4.500)

## 2013-02-03 MED ORDER — CLOBETASOL PROPIONATE 0.05 % EX CREA: TOPICAL_CREAM | Freq: Two times a day (BID) | CUTANEOUS | Status: DC

## 2013-02-03 NOTE — Progress Notes (Signed)
47 y.o. G1P1 SingleCaucasianF here for annual exam.  Lots of stress at home as her mother, now with Alzheimer's, is living there.  Patient doing care for her mom.  Notices lots of external itching with small external bumps.  No recent changes in soap, toilet paper, detergent.  No internal itching.  Hasn't taken thyroid medication in six to nine months.  Saw cardiologist for shortness of breat--Dr. Theodoro Clock.  Has pulmonary function tests.  Is going to be referred to pulmonologist.  Patient's last menstrual period was 08/04/2012.          Sexually active: no  The current method of family planning is none.    Exercising: no  not regularly Smoker:  yes  Health Maintenance: Pap:  11/24/11 ASCUS negative HR HPV 2012 MMG:  12/19/10 normal, she will schedule Colonoscopy:  2008 BMD:   2007 heel test TDaP:  2014 (with Dr. Astrid Divine) Labs: followed by Dr. Astrid Divine    reports that she has been smoking Cigarettes.  She has been smoking about 1.50 packs per day. She has never used smokeless tobacco. She reports that she does not drink alcohol or use illicit drugs.  Past Medical History  Diagnosis Date  . Hepatomegaly   . Hemorrhoids 2008  . Anxiety   . Mixed hyperlipidemia   . Panic disorder   . Anal fissure 1994  . Depression   . Hypothyroidism   . Obesity   . Tobacco abuse     not tolerated Chantix in the past  . Endometriosis   . Fracture 08/2011    fracture left wrist and elbow  . OCD (obsessive compulsive disorder)     Past Surgical History  Procedure Laterality Date  . Colonoscopy  03/25/2007    small external hemorroids  . Hemorroidectomy    . Cholecystectomy    . Left oophorectomy      Current Outpatient Prescriptions  Medication Sig Dispense Refill  . clonazePAM (KLONOPIN) 1 MG tablet Take 0.5-1 mg by mouth 3 (three) times daily. Take 1/2 by mouth every am, 1/2 at lunch, and 1 every night      . gabapentin (NEURONTIN) 100 MG capsule Take 200 mg by mouth 2 (two) times daily.      Marland Kitchen  ibuprofen (ADVIL,MOTRIN) 200 MG tablet Take 200 mg by mouth every 6 (six) hours as needed.      . pravastatin (PRAVACHOL) 40 MG tablet Take 40 mg by mouth daily.      . sertraline (ZOLOFT) 100 MG tablet Take 150 mg by mouth daily.      . Vitamin D, Ergocalciferol, (DRISDOL) 50000 UNITS CAPS Take 50,000 Units by mouth every 7 (seven) days.      Marland Kitchen levothyroxine (SYNTHROID, LEVOTHROID) 50 MCG tablet Take 50 mcg by mouth daily.       No current facility-administered medications for this visit.    Family History  Problem Relation Age of Onset  . Colon cancer Maternal Grandmother 50  . Breast cancer Mother   . Diabetes Mother   . Drug abuse Brother     and alcohol  . Cirrhosis Father     alcohol  . Heart disease Father     MI  . Alcohol abuse Sister     ROS:  Pertinent items are noted in HPI.  Otherwise, a comprehensive ROS was negative.  Exam:   BP 116/68  Ht 5\' 7"  (1.702 m)  Wt 229 lb (103.874 kg)  BMI 35.86 kg/m2  LMP 08/04/2012 :  Height: 5\' 7"  (170.2 cm)  Ht Readings from Last 3 Encounters:  02/03/13 5\' 7"  (1.702 m)  12/07/12 5\' 8"  (1.727 m)  01/02/12 5\' 8"  (1.727 m)    General appearance: alert, cooperative and appears stated age Head: Normocephalic, without obvious abnormality, atraumatic Neck: no adenopathy, supple, symmetrical, trachea midline and thyroid normal to inspection and palpation Lungs: clear to auscultation bilaterally Breasts: normal appearance, no masses or tenderness Heart: regular rate and rhythm Abdomen: soft, non-tender; bowel sounds normal; no masses,  no organomegaly Extremities: extremities normal, atraumatic, no cyanosis or edema Skin: Skin color, texture, turgor normal. No rashes or lesions Lymph nodes: Cervical, supraclavicular, and axillary nodes normal. No abnormal inguinal nodes palpated Neurologic: Grossly normal   Pelvic: External genitalia:  Erythema with small raised bumps c/w topical reaction              Urethra:  normal  appearing urethra with no masses, tenderness or lesions              Bartholins and Skenes: normal                 Vagina: normal appearing vagina with normal color and discharge, no lesions              Cervix: no lesions              Pap taken: yes Bimanual Exam:  Uterus:  normal size, contour, position, consistency, mobility, non-tender              Adnexa: no mass, fullness, tenderness               Rectovaginal: Confirms               Anus:  normal sphincter tone, no lesions  A:  Well Woman with normal exam PMP, no HRT Smoker Obesity Anxiety/Panic disorder Hypothyroidism Vulvar skin itching, possible reaction to new detergent  P:   Mammogram, pt WILL schedule pap smear today (H/O ASCUS in past, neg HR HPV 2012) TSH and panel Clobetasol 0.05% cream bid for up to 10 days.  If does not resolve, pt will call back for f/u. Seeing therapist and on Zoloft and Klonopin Dr. Astrid Divine, PCP, sees pt regulalry return annually or prn  An After Visit Summary was printed and given to the patient.

## 2013-02-03 NOTE — Patient Instructions (Signed)

## 2013-02-04 LAB — IPS PAP SMEAR ONLY

## 2013-02-24 ENCOUNTER — Encounter: Payer: Self-pay | Admitting: Cardiovascular Disease

## 2013-05-03 ENCOUNTER — Telehealth: Payer: Self-pay | Admitting: Obstetrics & Gynecology

## 2013-05-03 ENCOUNTER — Other Ambulatory Visit: Payer: Medicare Other

## 2013-08-15 ENCOUNTER — Other Ambulatory Visit: Payer: Self-pay | Admitting: Internal Medicine

## 2013-08-15 ENCOUNTER — Ambulatory Visit (HOSPITAL_COMMUNITY)
Admission: RE | Admit: 2013-08-15 | Discharge: 2013-08-15 | Disposition: A | Discharge status: Home / Self Care | Payer: Medicare Other | Source: Ambulatory Visit | Attending: Internal Medicine | Admitting: Internal Medicine

## 2013-08-15 DIAGNOSIS — M545 Low back pain, unspecified: Secondary | ICD-10-CM | POA: Insufficient documentation

## 2013-08-15 DIAGNOSIS — Z9089 Acquired absence of other organs: Secondary | ICD-10-CM | POA: Insufficient documentation

## 2013-08-15 LAB — CBC WITH DIFFERENTIAL/PLATELET
Basophils Absolute: 0.1 10*3/uL (ref 0.0–0.1)
Basophils Relative: 1 % (ref 0–1)
Eosinophils Absolute: 0.9 10*3/uL — ABNORMAL HIGH (ref 0.0–0.7)
MCH: 30.3 pg (ref 26.0–34.0)
MCHC: 34.3 g/dL (ref 30.0–36.0)
Neutro Abs: 3.8 10*3/uL (ref 1.7–7.7)
Neutrophils Relative %: 48 % (ref 43–77)
RDW: 13.6 % (ref 11.5–15.5)

## 2013-08-16 LAB — URINALYSIS, ROUTINE W REFLEX MICROSCOPIC
Ketones, ur: NEGATIVE mg/dL
Nitrite: NEGATIVE
Protein, ur: NEGATIVE mg/dL
Specific Gravity, Urine: <1.005 — ABNORMAL LOW (ref 1.005–1.030)
Urobilinogen, UA: 0.2 mg/dL (ref 0.0–1.0)

## 2013-08-16 LAB — URINE CULTURE
Colony Count: NO GROWTH
Organism ID, Bacteria: NO GROWTH

## 2013-08-16 LAB — BASIC METABOLIC PANEL WITH GFR
BUN: 4 mg/dL — ABNORMAL LOW (ref 6–23)
Calcium: 9.1 mg/dL (ref 8.4–10.5)
GFR, Est African American: >89 mL/min
GFR, Est Non African American: >89 mL/min
Potassium: 4.3 mEq/L (ref 3.5–5.3)
Sodium: 133 mEq/L — ABNORMAL LOW (ref 135–145)

## 2013-09-29 ENCOUNTER — Ambulatory Visit (INDEPENDENT_AMBULATORY_CARE_PROVIDER_SITE_OTHER): Payer: Medicare Other | Admitting: Obstetrics & Gynecology

## 2013-09-29 DIAGNOSIS — E559 Vitamin D deficiency, unspecified: Secondary | ICD-10-CM

## 2013-09-29 DIAGNOSIS — E039 Hypothyroidism, unspecified: Secondary | ICD-10-CM

## 2013-09-30 ENCOUNTER — Telehealth: Payer: Self-pay | Admitting: Obstetrics & Gynecology

## 2013-09-30 DIAGNOSIS — E039 Hypothyroidism, unspecified: Secondary | ICD-10-CM

## 2013-09-30 DIAGNOSIS — E559 Vitamin D deficiency, unspecified: Secondary | ICD-10-CM

## 2013-09-30 NOTE — Telephone Encounter (Signed)
Patient said she is returning [facility] call. No note in system

## 2013-09-30 NOTE — Telephone Encounter (Signed)
Patint calling to register for MyChart and

## 2013-09-30 NOTE — Telephone Encounter (Signed)
Cont from prev note, Patient requesting to sign up for My Chart and find out if Dr Hyacinth Meeker has lab results and what extra tests ordered for her "restless legs." Advised extra tests have not yet been sent but blood still available.  Sent patient the link to sign up for My Chart.

## 2013-10-03 NOTE — Telephone Encounter (Signed)
I ordered TSH and Vit D only.  I don't diagnose or treat restless leg syndrome and feel it is best this be done with PCP.  We can call with results and don't need to call before that time.

## 2013-10-04 LAB — VITAMIN D 25 HYDROXY (VIT D DEFICIENCY, FRACTURES): Vit D, 25-Hydroxy: 15 ng/mL — ABNORMAL LOW (ref 30–89)

## 2013-10-05 MED ORDER — VITAMIN D (ERGOCALCIFEROL) 1.25 MG (50000 UNIT) PO CAPS: 50000.0000 [IU] | ORAL_CAPSULE | ORAL | Status: DC

## 2013-10-11 ENCOUNTER — Telehealth: Payer: Self-pay

## 2013-10-11 NOTE — Telephone Encounter (Signed)
Patient is calling Kelly back. °

## 2013-10-11 NOTE — Telephone Encounter (Signed)
Lmtcb//kn 

## 2013-10-17 NOTE — Telephone Encounter (Signed)
Patient notified of results. F/u appointment scheduled.

## 2013-10-17 NOTE — Telephone Encounter (Signed)
Patient notified

## 2013-10-18 NOTE — Telephone Encounter (Signed)
Patient is aware 

## 2013-11-26 DIAGNOSIS — E559 Vitamin D deficiency, unspecified: Secondary | ICD-10-CM | POA: Insufficient documentation

## 2013-11-28 ENCOUNTER — Encounter: Payer: Self-pay | Admitting: Physician Assistant

## 2013-12-06 ENCOUNTER — Encounter: Payer: Self-pay | Admitting: Physician Assistant

## 2013-12-09 ENCOUNTER — Encounter: Payer: Self-pay | Admitting: Physician Assistant

## 2013-12-13 ENCOUNTER — Other Ambulatory Visit: Payer: Self-pay | Admitting: Physician Assistant

## 2013-12-13 ENCOUNTER — Encounter: Payer: Self-pay | Admitting: Physician Assistant

## 2013-12-13 ENCOUNTER — Ambulatory Visit (INDEPENDENT_AMBULATORY_CARE_PROVIDER_SITE_OTHER): Payer: Medicare Other | Admitting: Physician Assistant

## 2013-12-13 VITALS — BP 120/68 | HR 88 | Temp 97.9°F | Resp 16 | Ht 67.5 in | Wt 227.0 lb

## 2013-12-13 DIAGNOSIS — R7303 Prediabetes: Secondary | ICD-10-CM

## 2013-12-13 DIAGNOSIS — Z0000 Encounter for general adult medical examination without abnormal findings: Secondary | ICD-10-CM

## 2013-12-13 DIAGNOSIS — F32A Depression, unspecified: Secondary | ICD-10-CM

## 2013-12-13 DIAGNOSIS — F329 Major depressive disorder, single episode, unspecified: Secondary | ICD-10-CM

## 2013-12-13 DIAGNOSIS — E039 Hypothyroidism, unspecified: Secondary | ICD-10-CM

## 2013-12-13 DIAGNOSIS — E782 Mixed hyperlipidemia: Secondary | ICD-10-CM

## 2013-12-13 DIAGNOSIS — K219 Gastro-esophageal reflux disease without esophagitis: Secondary | ICD-10-CM

## 2013-12-13 DIAGNOSIS — R5381 Other malaise: Secondary | ICD-10-CM

## 2013-12-13 DIAGNOSIS — E669 Obesity, unspecified: Secondary | ICD-10-CM

## 2013-12-13 DIAGNOSIS — R0600 Dyspnea, unspecified: Secondary | ICD-10-CM

## 2013-12-13 DIAGNOSIS — N319 Neuromuscular dysfunction of bladder, unspecified: Secondary | ICD-10-CM

## 2013-12-13 DIAGNOSIS — N3000 Acute cystitis without hematuria: Secondary | ICD-10-CM

## 2013-12-13 DIAGNOSIS — R5383 Other fatigue: Secondary | ICD-10-CM

## 2013-12-13 DIAGNOSIS — E559 Vitamin D deficiency, unspecified: Secondary | ICD-10-CM

## 2013-12-13 LAB — CBC WITH DIFFERENTIAL/PLATELET
BASOS PCT: 1 % (ref 0–1)
Basophils Absolute: 0.1 10*3/uL (ref 0.0–0.1)
Eosinophils Absolute: 0.7 10*3/uL (ref 0.0–0.7)
Eosinophils Relative: 10 % — ABNORMAL HIGH (ref 0–5)
HCT: 41.7 % (ref 36.0–46.0)
Hemoglobin: 14.1 g/dL (ref 12.0–15.0)
LYMPHS PCT: 23 % (ref 12–46)
Lymphs Abs: 1.5 10*3/uL (ref 0.7–4.0)
MCH: 30.5 pg (ref 26.0–34.0)
MCHC: 33.8 g/dL (ref 30.0–36.0)
MCV: 90.1 fL (ref 78.0–100.0)
Monocytes Absolute: 0.5 10*3/uL (ref 0.1–1.0)
Monocytes Relative: 7 % (ref 3–12)
NEUTROS ABS: 3.8 10*3/uL (ref 1.7–7.7)
NEUTROS PCT: 59 % (ref 43–77)
Platelets: 325 10*3/uL (ref 150–400)
RBC: 4.63 MIL/uL (ref 3.87–5.11)
RDW: 14 % (ref 11.5–15.5)
WBC: 6.5 10*3/uL (ref 4.0–10.5)

## 2013-12-13 LAB — HEMOGLOBIN A1C
Hgb A1c MFr Bld: 5.6 % (ref <5.7)
Mean Plasma Glucose: 114 mg/dL (ref <117)

## 2013-12-13 LAB — BASIC METABOLIC PANEL WITH GFR
BUN: 6 mg/dL (ref 6–23)
CO2: 25 meq/L (ref 19–32)
Calcium: 9.9 mg/dL (ref 8.4–10.5)
Chloride: 103 mEq/L (ref 96–112)
Creat: 0.71 mg/dL (ref 0.50–1.10)
GFR, Est Non African American: >89 mL/min
Glucose, Bld: 90 mg/dL (ref 70–99)
POTASSIUM: 4.4 meq/L (ref 3.5–5.3)
SODIUM: 137 meq/L (ref 135–145)

## 2013-12-13 LAB — MAGNESIUM: MAGNESIUM: 1.8 mg/dL (ref 1.5–2.5)

## 2013-12-13 LAB — LIPID PANEL
Cholesterol: 182 mg/dL (ref 0–200)
HDL: 40 mg/dL (ref >39)
LDL CALC: 109 mg/dL — AB (ref 0–99)
TRIGLYCERIDES: 166 mg/dL — AB (ref <150)
Total CHOL/HDL Ratio: 4.6 Ratio
VLDL: 33 mg/dL (ref 0–40)

## 2013-12-13 LAB — TSH: TSH: 2.786 u[IU]/mL (ref 0.350–4.500)

## 2013-12-13 LAB — HEPATIC FUNCTION PANEL
ALT: 17 U/L (ref 0–35)
AST: 17 U/L (ref 0–37)
Albumin: 4.8 g/dL (ref 3.5–5.2)
Alkaline Phosphatase: 82 U/L (ref 39–117)
BILIRUBIN DIRECT: 0.1 mg/dL (ref 0.0–0.3)
BILIRUBIN INDIRECT: 0.2 mg/dL (ref 0.2–1.2)
Total Bilirubin: 0.3 mg/dL (ref 0.2–1.2)
Total Protein: 6.9 g/dL (ref 6.0–8.3)

## 2013-12-13 LAB — IRON AND TIBC
%SAT: 12 % — ABNORMAL LOW (ref 20–55)
IRON: 47 ug/dL (ref 42–145)
TIBC: 377 ug/dL (ref 250–470)
UIBC: 330 ug/dL (ref 125–400)

## 2013-12-13 LAB — FERRITIN: Ferritin: 26 ng/mL (ref 10–291)

## 2013-12-13 LAB — VITAMIN B12: Vitamin B-12: 269 pg/mL (ref 211–911)

## 2013-12-13 MED ORDER — PANTOPRAZOLE SODIUM 40 MG PO TBEC: 40.0000 mg | DELAYED_RELEASE_TABLET | Freq: Every day | ORAL | Status: DC

## 2013-12-13 MED ORDER — BUPROPION HCL ER (XL) 150 MG PO TB24: 150.0000 mg | ORAL_TABLET | ORAL | Status: DC

## 2013-12-13 NOTE — Progress Notes (Signed)
Complete Physical HPI 48 y.o. female  presents for a complete physical. Her blood pressure has been controlled at home, today their BP is BP: 120/68 mmHg She does not workout. She denies chest pain, shortness of breath, dizziness.  She is on cholesterol medication and denies myalgias. Her cholesterol is at goal. The cholesterol last visit was: Trigs 299, LDL 78.    She has been working on diet and exercise for prediabetes, and denies foot ulcerations, nausea, polydipsia, polyuria and visual disturbances. Last A1C in the office was: 5.8 Patient is on Vitamin D supplement, 16.  Sodium 133. Smoking cessation- long discussion smoking cessation, she would like to quit, had bad results with chantix.  Obesity with several comorbidity- wants to see nutritionist.  SOB with talking, at night, has had PFTs/Stress test that was normal. + epigastric pain Complains of inability to completely empty bladder, has to lean forward or push on bladder to empty, had enuresis once 2 months ago, + LBP and leg tingling, + urgency denies bowel incontinence, hematuria, dysuria, frequency, saddle numbness. X 6 months, unchanged and does no happen every time  Current Medications:  Current Outpatient Prescriptions on File Prior to Visit  Medication Sig Dispense Refill  . clobetasol cream (TEMOVATE) 0.05 % Apply topically 2 (two) times daily.  30 g  0  . clonazePAM (KLONOPIN) 1 MG tablet Take 0.5-1 mg by mouth 3 (three) times daily. Take 1/2 by mouth every am, 1/2 at lunch, and 1 every night      . gabapentin (NEURONTIN) 100 MG capsule Take 200 mg by mouth 2 (two) times daily.      Marland Kitchen. ibuprofen (ADVIL,MOTRIN) 200 MG tablet Take 200 mg by mouth every 6 (six) hours as needed.      Marland Kitchen. levothyroxine (SYNTHROID, LEVOTHROID) 50 MCG tablet Take 50 mcg by mouth daily.      . pravastatin (PRAVACHOL) 40 MG tablet Take 40 mg by mouth daily.      . sertraline (ZOLOFT) 100 MG tablet Take 150 mg by mouth daily.      . Vitamin D,  Ergocalciferol, (DRISDOL) 50000 UNITS CAPS capsule Take 1 capsule (50,000 Units total) by mouth every 7 (seven) days.  30 capsule  0   No current facility-administered medications on file prior to visit.   Health Maintenance:  Immunization History  Administered Date(s) Administered  . Influenza Split 08/01/2013  . Td 11/18/2012   Tetanus: 11/2012 Pneumovax: N/A Flu vaccine: 07/2013 Zostavax: N/A Pap: 2013, ASCUS--> 01/2013- follow up with Dr. Hyacinth MeekerMiller in June MGM: 2012 OVER DUE DEXA: 2008, heel, neg Colonoscopy: 05/2007- likely due next year EGD: N/A PFT: 01/2013 Stress test: 12/2012 [facility])  Allergies: No Known Allergies Medical History:  Past Medical History  Diagnosis Date  . Hepatomegaly   . Hemorrhoids 2008  . Anxiety   . Panic disorder   . Anal fissure 1994  . Depression   . Hypothyroidism   . Obesity   . Tobacco abuse     not tolerated Chantix in the past  . Endometriosis   . Fracture 08/2011    fracture left wrist and elbow  . OCD (obsessive compulsive disorder)   . Mixed hyperlipidemia   . Vitamin D deficiency    Surgical History:  Past Surgical History  Procedure Laterality Date  . Colonoscopy  03/25/2007    small external hemorroids  . Hemorroidectomy    . Cholecystectomy    . Left oophorectomy     Family History:  Family History  Problem  Relation Age of Onset  . Colon cancer Maternal Grandmother 50  . Breast cancer Mother   . Diabetes Mother   . Drug abuse Brother     and alcohol  . Cirrhosis Father     alcohol  . Heart disease Father     MI  . Alcohol abuse Sister    Social History:  History  Substance Use Topics  . Smoking status: Current Every Day Smoker -- 1.50 packs/day    Types: Cigarettes  . Smokeless tobacco: Never Used  . Alcohol Use: No     Review of Systems: [X]  = complains of  [ ]  = denies  General: Fatigue [ ]  Fever [ ]  Chills [ ]  Weakness [ ]   Insomnia [ ]   Weight change [ ]  Night sweats [ ]   Change in appetite [  ] Eyes: Redness [ ]  Blurred vision [ ]  Diplopia [ ]  Discharge [ ]   ENT: Congestion [ ]  Sinus Pain [ ]  Post Nasal Drip [ ]  Sore Throat [ ]  Earache [ ]  hearing loss [ ]  Tinnitus [ ]  Snoring [ ]   Cardiac: Chest pain/pressure [ ]  SOB [ ]  Orthopnea [ ]   Palpitations [ ]   Paroxysmal nocturnal dyspnea[ ]  Claudication [ ]  Edema [ ]   Pulmonary: Cough [ ]  Wheezing[ ]   SOB [ ]   Pleurisy [ ]   GI: Nausea [ ]  Vomiting[ ]  Dysphagia[ ]  Heartburn[ ]  Abdominal pain [ ]  Constipation [ ] ; Diarrhea [ ]  BRBPR [ ]  Melena[ ]  Bloating [ ]  Hemorrhoids [ ]   GU: Hematuria[ ]  Dysuria [ ]  Nocturia[ ]  Urgency [ ]   Hesitancy [ ]  Discharge [ ]  Frequency [ ]   Breast:  Breast lumps [ ]   nipple discharge [ ]    Neuro: Headaches[ ]  Vertigo[ ]  Paresthesias[ ]  Spasm [ ]  Speech changes [ ]  Incoordination [ ]   Ortho: Arthritis [ ]  Joint pain [ ]  Muscle pain [ ]  Joint swelling [ ]  Back Pain [ ]  Skin:  Rash [ ]   Pruritis [ ]  Change in skin lesion [ ]   Psych: Depression[ ]  Anxiety[ ]  Confusion [ ]  Memory loss [ ]   Heme/Lypmh: Bleeding [ ]  Bruising [ ]  Enlarged lymph nodes [ ]   Endocrine: Visual blurring [ ]  Paresthesia [ ]  Polyuria [ ]  Polydypsea [ ]    Heat/cold intolerance [ ]  Hypoglycemia [ ]   Physical Exam: Estimated body mass index is 35.01 kg/(m^2) as calculated from the following:   Height as of this encounter: 5' 7.5" (1.715 m).   Weight as of this encounter: 227 lb (102.967 kg). Filed Vitals:   12/13/13 0918  BP: 120/68  Pulse: 88  Temp: 97.9 F (36.6 C)  Resp: 16   General Appearance: Well nourished, in no apparent distress. Eyes: PERRLA, EOMs, conjunctiva no swelling or erythema, normal fundi and vessels. Sinuses: No Frontal/maxillary tenderness ENT/Mouth: Ext aud canals clear, normal light reflex with TMs without erythema, bulging.  Good dentition. No erythema, swelling, or exudate on post pharynx. Tonsils not swollen or erythematous. Hearing normal.  Neck: Supple, thyroid normal. No bruits Respiratory: Respiratory  effort normal, BS equal bilaterally without rales, rhonchi, wheezing or stridor. Cardio: RRR without murmurs, rubs or gallops. Brisk peripheral pulses without edema.  Chest: symmetric, with normal excursions and percussion. Breasts: defer Abdomen: Soft, +BS, obese, epigastric and LLQ tenderness, no guarding, rebound, hernias, masses, or organomegaly. .  Lymphatics: Non tender without lymphadenopathy.  Genitourinary: defer Musculoskeletal: Full ROM all peripheral extremities,5/5 strength, and normal gait. Skin: Warm, dry without  rashes, lesions, ecchymosis.  Neuro: Cranial nerves intact, reflexes decreased bilateral lower legs, 1+, left worse than right. Normal muscle tone, no cerebellar symptoms. Sensation intact.  Psych: Awake and oriented X 3, normal affect, Insight and Judgment appropriate.   EKG: WNL no changes.  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  SOB- GERD, has had PFT and neg stress test, neg sleep study- stop smoking, increase exercise, and 2 weeks PPI, check hpylori.  Incomplete voiding- ? Neurogenic bladder, versus meds, versus infection, smoker r/o cancer- send to urologist, check UA C&S.   Discussed med's effects and SE's. Screening labs and tests as requested with regular follow-up as recommended. OVER 60 minutes of exam, counseling, chart review, referral performed   Quentin Mulling 9:23 AM

## 2013-12-13 NOTE — Patient Instructions (Addendum)
What is the TMJ? The temporomandibular (tem-PUH-ro-man-DIB-yoo-ler) joint, or the TMJ, connects the upper and lower jawbones. This joint allows the jaw to open wide and move back and forth when you chew, talk, or yawn.There are also several muscles that help this joint move. There can be muscle tightness and pain in the muscle that can cause several symptoms.  What causes TMJ pain? There are many causes of TMJ pain. Repeated chewing (for example, chewing gum) and clenching your teeth can cause pain in the joint. Some TMJ pain has no obvious cause. What can I do to ease the pain? There are many things you can do to help your pain get better. When you have pain:  Eat soft foods and stay away from chewy foods (for example, taffy) Try to use both sides of your mouth to chew Don't chew gum Don't open your mouth wide (for example, during yawning or singing) Don't bite your cheeks or fingernails Lower your amount of stress and worry Applying a warm, damp washcloth to the joint may help. Over-the-counter pain medicines such as ibuprofen (one brand: Advil) or acetaminophen (one brand: Tylenol) might also help. Do not use these medicines if you are allergic to them or if your doctor told you not to use them. How can I stop the pain from coming back? When your pain is better, you can do these exercises to make your muscles stronger and to keep the pain from coming back:  Resisted mouth opening: Place your thumb or two fingers under your chin and open your mouth slowly, pushing up lightly on your chin with your thumb. Hold for three to six seconds. Close your mouth slowly. Resisted mouth closing: Place your thumbs under your chin and your two index fingers on the ridge between your mouth and the bottom of your chin. Push down lightly on your chin as you close your mouth. Tongue up: Slowly open and close your mouth while keeping the tongue touching the roof of the mouth. Side-to-side jaw movement: Place an  object about one fourth of an inch thick (for example, two tongue depressors) between your front teeth. Slowly move your jaw from side to side. Increase the thickness of the object as the exercise becomes easier Forward jaw movement: Place an object about one fourth of an inch thick between your front teeth and move the bottom jaw forward so that the bottom teeth are in front of the top teeth. Increase the thickness of the object as the exercise becomes easier. These exercises should not be painful. If it hurts to do these exercises, stop doing them and talk to your family doctor.     Bad carbs also include fruit juice, alcohol, and sweet tea. These are empty calories that do not signal to your brain that you are full.   Please remember the good carbs are still carbs which convert into sugar. So please measure them out no more than 1/2-1 cup of rice, oatmeal, pasta, and beans.  Veggies are however free foods! Pile them on.   I like lean protein at every meal such as chicken, [place], pork chops, cottage cheese, etc. Just do not fry these meats and please center your meal around vegetable, the meats should be a side dish.   No all fruit is created equal. Please see the list below, the fruit at the bottom is higher in sugars than the fruit at the top   We want weight loss that will last so you should lose 1-2  pounds a week.  THAT IS IT! Please pick THREE things a month to change. Once it is a habit check off the item. Then pick another three items off the list to become habits.  If you are already doing a habit on the list GREAT!  Cross that item off! o Don't drink your calories. Ie, alcohol, soda, fruit juice, and sweet tea.  o Drink more water. Drink a glass when you feel hungry or before each meal.  o Eat breakfast - Complex carb and protein (likeDannon light and fit yogurt, oatmeal, fruit, eggs, [place] bacon). o Measure your cereal.  Eat no more than one cup a day. (ie [place]) o Eat an apple a  day. o Add a vegetable a day. o Try a new vegetable a month. o Use Pam! Stop using oil or butter to cook. o Don't finish your plate or use smaller plates. o Share your dessert. o Eat sugar free Jello for dessert or frozen grapes. o Don't eat 2-3 hours before bed. o Switch to whole wheat bread, pasta, and brown rice. o Make healthier choices when you eat out. No fries! o Pick baked chicken, NOT fried. o Don't forget to SLOW DOWN when you eat. It is not going anywhere.  o Take the stairs. o Park far away in the parking lot o [employer] (or weights) for 10 minutes while watching TV. o Walk at work for 10 minutes during break. o Walk outside 1 time a week with your friend, kids, dog, or significant other. o Start a walking group at church. o Walk the mall as much as you can tolerate.  o Keep a food diary. o Weigh yourself daily. o Walk for 15 minutes 3 days per week. o Cook at home more often and eat out less.  If life happens and you go back to old habits, it is okay.  Just start over. You can do it!   If you experience chest pain, get short of breath, or tired during the exercise, please stop immediately and inform your doctor.   Plantar Fasciitis (Heel Spur Syndrome) with Rehab The plantar fascia is a fibrous, ligament-like, soft-tissue structure that spans the bottom of the foot. Plantar fasciitis is a condition that causes pain in the foot due to inflammation of the tissue. SYMPTOMS   Pain and tenderness on the underneath side of the foot.  Pain that worsens with standing or walking. CAUSES  Plantar fasciitis is caused by irritation and injury to the plantar fascia on the underneath side of the foot. Common mechanisms of injury include:  Direct trauma to bottom of the foot.  Damage to a small nerve that runs under the foot where the main fascia attaches to the heel bone.  Stress placed on the plantar fascia due to bone spurs. RISK INCREASES WITH:   Activities that  place stress on the plantar fascia (running, jumping, pivoting, or cutting).  Poor strength and flexibility.  Improperly fitted shoes.  Tight calf muscles.  Flat feet.  Failure to warm-up properly before activity.  Obesity. PREVENTION  Warm up and stretch properly before activity.  Allow for adequate recovery between workouts.  Maintain physical fitness:  Strength, flexibility, and endurance.  Cardiovascular fitness.  Maintain a health body weight.  Avoid stress on the plantar fascia.  Wear properly fitted shoes, including arch supports for individuals who have flat feet. PROGNOSIS  If treated properly, then the symptoms of plantar fasciitis usually resolve without surgery. However, occasionally surgery  is necessary. RELATED COMPLICATIONS   Recurrent symptoms that may result in a chronic condition.  Problems of the lower back that are caused by compensating for the injury, such as limping.  Pain or weakness of the foot during push-off following surgery.  Chronic inflammation, scarring, and partial or complete fascia tear, occurring more often from repeated injections. TREATMENT  Treatment initially involves the use of ice and medication to help reduce pain and inflammation. The use of strengthening and stretching exercises may help reduce pain with activity, especially stretches of the Achilles tendon. These exercises may be performed at home or with a therapist. Your caregiver may recommend that you use heel cups of arch supports to help reduce stress on the plantar fascia. Occasionally, corticosteroid injections are given to reduce inflammation. If symptoms persist for greater than 6 months despite non-surgical (conservative), then surgery may be recommended.  MEDICATION   If pain medication is necessary, then nonsteroidal anti-inflammatory medications, such as aspirin and ibuprofen, or other minor pain relievers, such as acetaminophen, are often recommended.  Do not  take pain medication within 7 days before surgery.  Prescription pain relievers may be given if deemed necessary by your caregiver. Use only as directed and only as much as you need.  Corticosteroid injections may be given by your caregiver. These injections should be reserved for the most serious cases, because they may only be given a certain number of times. HEAT AND COLD  Cold treatment (icing) relieves pain and reduces inflammation. Cold treatment should be applied for 10 to 15 minutes every 2 to 3 hours for inflammation and pain and immediately after any activity that aggravates your symptoms. Use ice packs or massage the area with a piece of ice (ice massage).  Heat treatment may be used prior to performing the stretching and strengthening activities prescribed by your caregiver, physical therapist, or athletic trainer. Use a heat pack or soak the injury in warm water. SEEK IMMEDIATE MEDICAL CARE IF:  Treatment seems to offer no benefit, or the condition worsens.  Any medications produce adverse side effects. EXERCISES RANGE OF MOTION (ROM) AND STRETCHING EXERCISES - Plantar Fasciitis (Heel Spur Syndrome) These exercises may help you when beginning to rehabilitate your injury. Your symptoms may resolve with or without further involvement from your physician, physical therapist or athletic trainer. While completing these exercises, remember:   Restoring tissue flexibility helps normal motion to return to the joints. This allows healthier, less painful movement and activity.  An effective stretch should be held for at least 30 seconds.  A stretch should never be painful. You should only feel a gentle lengthening or release in the stretched tissue. RANGE OF MOTION - Toe Extension, Flexion  Sit with your right / left leg crossed over your opposite knee.  Grasp your toes and gently pull them back toward the top of your foot. You should feel a stretch on the bottom of your toes and/or  foot.  Hold this stretch for __________ seconds.  Now, gently pull your toes toward the bottom of your foot. You should feel a stretch on the top of your toes and or foot.  Hold this stretch for __________ seconds. Repeat __________ times. Complete this stretch __________ times per day.  RANGE OF MOTION - Ankle Dorsiflexion, Active Assisted  Remove shoes and sit on a chair that is preferably not on a carpeted surface.  Place right / left foot under knee. Extend your opposite leg for support.  Keeping your heel down, slide  your right / left foot back toward the chair until you feel a stretch at your ankle or calf. If you do not feel a stretch, slide your bottom forward to the edge of the chair, while still keeping your heel down.  Hold this stretch for __________ seconds. Repeat __________ times. Complete this stretch __________ times per day.  STRETCH  Gastroc, Standing  Place hands on wall.  Extend right / left leg, keeping the front knee somewhat bent.  Slightly point your toes inward on your back foot.  Keeping your right / left heel on the floor and your knee straight, shift your weight toward the wall, not allowing your back to arch.  You should feel a gentle stretch in the right / left calf. Hold this position for __________ seconds. Repeat __________ times. Complete this stretch __________ times per day. STRETCH  Soleus, Standing  Place hands on wall.  Extend right / left leg, keeping the other knee somewhat bent.  Slightly point your toes inward on your back foot.  Keep your right / left heel on the floor, bend your back knee, and slightly shift your weight over the back leg so that you feel a gentle stretch deep in your back calf.  Hold this position for __________ seconds. Repeat __________ times. Complete this stretch __________ times per day. STRETCH  Gastrocsoleus, Standing  Note: This exercise can place a lot of stress on your foot and ankle. Please complete  this exercise only if specifically instructed by your caregiver.   Place the ball of your right / left foot on a step, keeping your other foot firmly on the same step.  Hold on to the wall or a rail for balance.  Slowly lift your other foot, allowing your body weight to press your heel down over the edge of the step.  You should feel a stretch in your right / left calf.  Hold this position for __________ seconds.  Repeat this exercise with a slight bend in your right / left knee. Repeat __________ times. Complete this stretch __________ times per day.  STRENGTHENING EXERCISES - Plantar Fasciitis (Heel Spur Syndrome)  These exercises may help you when beginning to rehabilitate your injury. They may resolve your symptoms with or without further involvement from your physician, physical therapist or athletic trainer. While completing these exercises, remember:   Muscles can gain both the endurance and the strength needed for everyday activities through controlled exercises.  Complete these exercises as instructed by your physician, physical therapist or athletic trainer. Progress the resistance and repetitions only as guided. STRENGTH - Towel Curls  Sit in a chair positioned on a non-carpeted surface.  Place your foot on a towel, keeping your heel on the floor.  Pull the towel toward your heel by only curling your toes. Keep your heel on the floor.  If instructed by your physician, physical therapist or athletic trainer, add ____________________ at the end of the towel. Repeat __________ times. Complete this exercise __________ times per day. STRENGTH - Ankle Inversion  Secure one end of a rubber exercise band/tubing to a fixed object (table, pole). Loop the other end around your foot just before your toes.  Place your fists between your knees. This will focus your strengthening at your ankle.  Slowly, pull your big toe up and in, making sure the band/tubing is positioned to resist  the entire motion.  Hold this position for __________ seconds.  Have your muscles resist the band/tubing as it slowly pulls  your foot back to the starting position. Repeat __________ times. Complete this exercises __________ times per day.  Document Released: 09/29/2005 Document Revised: 12/22/2011 Document Reviewed: 01/11/2009 [facility] Patient Information 2014 [place], [place].

## 2013-12-14 LAB — URINALYSIS, ROUTINE W REFLEX MICROSCOPIC
Bilirubin Urine: NEGATIVE
Glucose, UA: NEGATIVE mg/dL
Hgb urine dipstick: NEGATIVE
KETONES UR: NEGATIVE mg/dL
Leukocytes, UA: NEGATIVE
NITRITE: NEGATIVE
PROTEIN: NEGATIVE mg/dL
SPECIFIC GRAVITY, URINE: 1.012 (ref 1.005–1.030)
UROBILINOGEN UA: 0.2 mg/dL (ref 0.0–1.0)
pH: 5 (ref 5.0–8.0)

## 2013-12-14 LAB — INSULIN, FASTING: Insulin fasting, serum: 11 u[IU]/mL (ref 3–28)

## 2013-12-14 LAB — MICROALBUMIN / CREATININE URINE RATIO
Creatinine, Urine: 89.5 mg/dL
Microalb Creat Ratio: 5.6 mg/g (ref 0.0–30.0)
Microalb, Ur: 0.5 mg/dL (ref 0.00–1.89)

## 2013-12-14 LAB — VITAMIN D 25 HYDROXY (VIT D DEFICIENCY, FRACTURES): Vit D, 25-Hydroxy: 35 ng/mL (ref 30–89)

## 2013-12-14 LAB — URINE CULTURE
Colony Count: NO GROWTH
Organism ID, Bacteria: NO GROWTH

## 2013-12-15 LAB — HELICOBACTER PYLORI ABS-IGG+IGA, BLD
H Pylori IgG: <0.4 {ISR}
HELICOBACTER PYLORI AB, IGA: 1.5 U/mL (ref <9.0)

## 2013-12-16 ENCOUNTER — Encounter (HOSPITAL_BASED_OUTPATIENT_CLINIC_OR_DEPARTMENT_OTHER): Payer: Self-pay | Admitting: Emergency Medicine

## 2013-12-16 ENCOUNTER — Emergency Department (HOSPITAL_BASED_OUTPATIENT_CLINIC_OR_DEPARTMENT_OTHER): Payer: Medicare Other

## 2013-12-16 ENCOUNTER — Emergency Department (HOSPITAL_BASED_OUTPATIENT_CLINIC_OR_DEPARTMENT_OTHER)
Admission: EM | Admit: 2013-12-16 | Discharge: 2013-12-16 | Disposition: A | Discharge status: Home / Self Care | Payer: Medicare Other | Attending: Emergency Medicine | Admitting: Emergency Medicine

## 2013-12-16 DIAGNOSIS — W2209XA Striking against other stationary object, initial encounter: Secondary | ICD-10-CM | POA: Insufficient documentation

## 2013-12-16 DIAGNOSIS — F429 Obsessive-compulsive disorder, unspecified: Secondary | ICD-10-CM | POA: Insufficient documentation

## 2013-12-16 DIAGNOSIS — Z8781 Personal history of (healed) traumatic fracture: Secondary | ICD-10-CM | POA: Insufficient documentation

## 2013-12-16 DIAGNOSIS — Y9289 Other specified places as the place of occurrence of the external cause: Secondary | ICD-10-CM | POA: Insufficient documentation

## 2013-12-16 DIAGNOSIS — Y9301 Activity, walking, marching and hiking: Secondary | ICD-10-CM | POA: Insufficient documentation

## 2013-12-16 DIAGNOSIS — F3289 Other specified depressive episodes: Secondary | ICD-10-CM | POA: Insufficient documentation

## 2013-12-16 DIAGNOSIS — F410 Panic disorder [episodic paroxysmal anxiety] without agoraphobia: Secondary | ICD-10-CM | POA: Insufficient documentation

## 2013-12-16 DIAGNOSIS — E782 Mixed hyperlipidemia: Secondary | ICD-10-CM | POA: Insufficient documentation

## 2013-12-16 DIAGNOSIS — Z8742 Personal history of other diseases of the female genital tract: Secondary | ICD-10-CM | POA: Insufficient documentation

## 2013-12-16 DIAGNOSIS — Z79899 Other long term (current) drug therapy: Secondary | ICD-10-CM | POA: Insufficient documentation

## 2013-12-16 DIAGNOSIS — E559 Vitamin D deficiency, unspecified: Secondary | ICD-10-CM | POA: Insufficient documentation

## 2013-12-16 DIAGNOSIS — Z8679 Personal history of other diseases of the circulatory system: Secondary | ICD-10-CM | POA: Insufficient documentation

## 2013-12-16 DIAGNOSIS — S60229A Contusion of unspecified hand, initial encounter: Secondary | ICD-10-CM | POA: Insufficient documentation

## 2013-12-16 DIAGNOSIS — F17200 Nicotine dependence, unspecified, uncomplicated: Secondary | ICD-10-CM | POA: Insufficient documentation

## 2013-12-16 DIAGNOSIS — E669 Obesity, unspecified: Secondary | ICD-10-CM | POA: Insufficient documentation

## 2013-12-16 DIAGNOSIS — F329 Major depressive disorder, single episode, unspecified: Secondary | ICD-10-CM | POA: Insufficient documentation

## 2013-12-16 NOTE — ED Notes (Signed)
Hit right hand on door frame approx 815pm-bruising noted

## 2013-12-16 NOTE — ED Notes (Signed)
Pt walking down hall and hit rt hand on door  Increased bruising and swelling

## 2013-12-16 NOTE — Discharge Instructions (Signed)
Hand Contusion °A hand contusion is a deep bruise on your hand area. Contusions are the result of an injury that caused bleeding under the skin. The contusion may turn blue, purple, or yellow. Minor injuries will give you a painless contusion, but more severe contusions may stay painful and swollen for a few weeks. °CAUSES  °A contusion is usually caused by a blow, trauma, or direct force to an area of the body. °SYMPTOMS  °· Swelling and redness of the injured area. °· Discoloration of the injured area. °· Tenderness and soreness of the injured area. °· Pain. °DIAGNOSIS  °The diagnosis can be made by taking a history and performing a physical exam. An X-ray, CT scan, or MRI may be needed to determine if there were any associated injuries, such as broken bones (fractures). °TREATMENT  °Often, the best treatment for a hand contusion is resting, elevating, icing, and applying cold compresses to the injured area. Over-the-counter medicines may also be recommended for pain control. °HOME CARE INSTRUCTIONS  °· Put ice on the injured area. °· Put ice in a plastic bag. °· Place a towel between your skin and the bag. °· Leave the ice on for 15-20 minutes, 03-04 times a day. °· Only take over-the-counter or prescription medicines as directed by your caregiver. Your caregiver may recommend avoiding anti-inflammatory medicines (aspirin, ibuprofen, and naproxen) for 48 hours because these medicines may increase bruising. °· If told, use an elastic wrap as directed. This can help reduce swelling. You may remove the wrap for sleeping, showering, and bathing. If your fingers become numb, cold, or blue, take the wrap off and reapply it more loosely. °· Elevate your hand with pillows to reduce swelling. °· Avoid overusing your hand if it is painful. °SEEK IMMEDIATE MEDICAL CARE IF:  °· You have increased redness, swelling, or pain in your hand. °· Your swelling or pain is not relieved with medicines. °· You have loss of feeling in  your hand or are unable to move your fingers. °· Your hand turns cold or blue. °· You have pain when you move your fingers. °· Your hand becomes warm to the touch. °· Your contusion does not improve in 2 days. °MAKE SURE YOU:  °· Understand these instructions. °· Will watch your condition. °· Will get help right away if you are not doing well or get worse. °Document Released: 03/21/2002 Document Revised: 06/23/2012 Document Reviewed: 03/22/2012 °[employer]® Patient Information ©2014 [employer]. ° °

## 2013-12-16 NOTE — ED Provider Notes (Signed)
CSN: [id number]     Arrival date & time 12/16/13  2114 History  This chart was scribed for Susan ChurnJohn David Andee Chivers Bartlett, * by Susan Bartlett, ED Scribe. This patient was seen in room MH03/MH03 and the patient's care was started at 9:43 PM.     Chief Complaint  Patient presents with  . Hand Injury    Patient is a 48 y.o. female presenting with hand injury. The history is provided by the patient. No language interpreter was used.  Hand Injury Location:  Hand Time since incident:  2 hours Injury: yes   Mechanism of injury comment:  Hit hand on the door Hand location:  R hand Pain details:    Radiates to:  Does not radiate   Severity:  Mild   Onset quality:  Sudden   Progression:  Improving Chronicity:  New Handedness:  Right-handed Dislocation: no   Foreign body present:  No foreign bodies Prior injury to area:  No Relieved by:  Ice Worsened by:  Movement Associated symptoms: muscle weakness   Associated symptoms: no fever, no numbness and no tingling    HPI Comments: Susan Bartlett is a 48 y.o. female who presents to the Emergency Department complaining of right hand injury that occurred about 2 hours ago. Pt states that she accidentally hit her hand on the door when she was walking in the house. She is also complaining of associated hand swelling, and a contusion to her right hand. Pt reports applying cold compress with mild relief. She reports taking 2 Motrin 2 hours before the injury occurred. Denies any fever, diaphoresis, chills, abdominal pain, weakness, numbness, or tingling.   Past Medical History  Diagnosis Date  . Hepatomegaly   . Hemorrhoids 2008  . Anxiety   . Panic disorder   . Anal fissure 1994  . Depression   . Hypothyroidism   . Obesity   . Tobacco abuse     not tolerated Chantix in the past  . Endometriosis   . Fracture 08/2011    fracture left wrist and elbow  . OCD (obsessive compulsive disorder)   . Mixed hyperlipidemia   . Vitamin D deficiency   . RLS  (restless legs syndrome) 2014    vis sleep study- no sleep apnea   Past Surgical History  Procedure Laterality Date  . Colonoscopy  03/25/2007    small external hemorroids  . Hemorroidectomy    . Cholecystectomy    . Left oophorectomy     Family History  Problem Relation Age of Onset  . Colon cancer Maternal Grandmother 50  . Breast cancer Mother   . Diabetes Mother   . Drug abuse Brother     and alcohol  . Cirrhosis Father     alcohol  . Heart disease Father     MI  . Alcohol abuse Sister    History  Substance Use Topics  . Smoking status: Current Every Day Smoker -- 1.50 packs/day    Types: Cigarettes  . Smokeless tobacco: Never Used  . Alcohol Use: No   OB History   Grav Para Term Preterm Abortions TAB SAB Ect Mult Living   1 1        1      Review of Systems  Constitutional: Negative for fever, chills and diaphoresis.  Gastrointestinal: Negative for abdominal pain.  Skin: Positive for wound (bruise on right hand).  Neurological: Negative for weakness.  All other systems reviewed and are negative.      Allergies  Review of patient's allergies indicates no known allergies.  Home Medications   Current Outpatient Rx  Name  Route  Sig  Dispense  Refill  . buPROPion (WELLBUTRIN XL) 150 MG 24 hr tablet   Oral   Take 1 tablet (150 mg total) by mouth every morning.   30 tablet   2   . clonazePAM (KLONOPIN) 1 MG tablet   Oral   Take 0.5-1 mg by mouth 3 (three) times daily. Take 1/2 by mouth every am, 1/2 at lunch, and 1 every night         . gabapentin (NEURONTIN) 100 MG capsule   Oral   Take 200 mg by mouth 2 (two) times daily as needed.          Marland Kitchen ibuprofen (ADVIL,MOTRIN) 200 MG tablet   Oral   Take 200 mg by mouth every 6 (six) hours as needed.         . pantoprazole (PROTONIX) 40 MG tablet   Oral   Take 1 tablet (40 mg total) by mouth daily.   30 tablet   1   . pravastatin (PRAVACHOL) 40 MG tablet   Oral   Take 40 mg by mouth daily.          . sertraline (ZOLOFT) 100 MG tablet   Oral   Take 150 mg by mouth daily.         . Vitamin D, Ergocalciferol, (DRISDOL) 50000 UNITS CAPS capsule   Oral   Take 1 capsule (50,000 Units total) by mouth every 7 (seven) days.   30 capsule   0    Triage Vitals:BP 150/77  Pulse 97  Temp(Src) 98.6 F (37 C) (Oral)  Resp 18  Ht 5\' 9"  (1.753 m)  Wt 227 lb (102.967 kg)  BMI 33.51 kg/m2  SpO2 100%  LMP 08/04/2012  Physical Exam  Nursing note and vitals reviewed. Constitutional: She appears well-developed and well-nourished. No distress.  HENT:  Head: Normocephalic and atraumatic.  Eyes: Conjunctivae are normal. Right eye exhibits no discharge. Left eye exhibits no discharge.  Neck: Neck supple.  Cardiovascular: Normal rate, regular rhythm and normal heart sounds.  Exam reveals no gallop and no friction rub.   No murmur heard. Pulmonary/Chest: Effort normal and breath sounds normal. No respiratory distress.  Abdominal: Soft. She exhibits no distension. There is no tenderness.  Musculoskeletal:       Hands: Neurological: She is alert.  Skin: Skin is warm and dry.  Psychiatric: She has a normal mood and affect. Her behavior is normal. Thought content normal.    ED Course  Procedures (including critical care time)  DIAGNOSTIC STUDIES: Oxygen Saturation is 100% on RA, normal by my interpretation.    COORDINATION OF CARE: 9:50 PM- Advised pt to take motrin and continue with the cold compress. Pt advised of plan for treatment and pt agrees.  Labs Review Labs Reviewed - No data to display Imaging Review Dg Hand Complete Right  12/16/2013   CLINICAL DATA:  Recent traumatic injury with pain  EXAM: RIGHT HAND - COMPLETE 3+ VIEW  COMPARISON:  None.  FINDINGS: There is no evidence of fracture or dislocation. There is no evidence of arthropathy or other focal bone abnormality. Soft tissues are unremarkable.  IMPRESSION: No acute abnormality noted.   Electronically Signed   By:  Alcide Clever M.D.   On: 12/16/2013 21:54  All radiology studies independently viewed by me.      EKG Interpretation None  MDM   Final diagnoses:  Contusion, hand    Contusion to right hand. Films negative.  I personally performed the services described in this documentation, which was scribed in my presence. The recorded information has been reviewed and is accurate.     Susan Bartlett III, MD 12/16/13 [phone]

## 2013-12-17 ENCOUNTER — Ambulatory Visit
Admission: RE | Admit: 2013-12-17 | Discharge: 2013-12-17 | Disposition: A | Discharge status: Home / Self Care | Payer: Medicare Other | Source: Ambulatory Visit | Attending: Physician Assistant | Admitting: Physician Assistant

## 2013-12-17 DIAGNOSIS — N319 Neuromuscular dysfunction of bladder, unspecified: Secondary | ICD-10-CM

## 2013-12-20 ENCOUNTER — Other Ambulatory Visit: Payer: Self-pay | Admitting: Physician Assistant

## 2013-12-20 ENCOUNTER — Telehealth: Payer: Self-pay | Admitting: *Deleted

## 2013-12-20 ENCOUNTER — Encounter: Payer: Self-pay | Admitting: Physician Assistant

## 2013-12-20 DIAGNOSIS — M549 Dorsalgia, unspecified: Secondary | ICD-10-CM

## 2013-12-21 NOTE — Telephone Encounter (Signed)
DONE

## 2013-12-24 ENCOUNTER — Encounter: Payer: Self-pay | Admitting: Internal Medicine

## 2013-12-25 ENCOUNTER — Other Ambulatory Visit: Payer: Self-pay | Admitting: Physician Assistant

## 2014-01-03 ENCOUNTER — Telehealth: Payer: Self-pay | Admitting: Obstetrics & Gynecology

## 2014-01-03 ENCOUNTER — Other Ambulatory Visit: Payer: Medicare Other

## 2014-01-03 NOTE — Telephone Encounter (Signed)
Left message for pt regarding missed lab appointment.

## 2014-01-23 ENCOUNTER — Encounter: Payer: Self-pay | Admitting: Internal Medicine

## 2014-01-23 DIAGNOSIS — M549 Dorsalgia, unspecified: Secondary | ICD-10-CM

## 2014-01-23 DIAGNOSIS — M722 Plantar fascial fibromatosis: Secondary | ICD-10-CM

## 2014-01-23 DIAGNOSIS — M25559 Pain in unspecified hip: Secondary | ICD-10-CM

## 2014-01-24 ENCOUNTER — Encounter: Payer: Medicare Other | Attending: Physician Assistant | Admitting: *Deleted

## 2014-01-24 ENCOUNTER — Encounter: Payer: Self-pay | Admitting: *Deleted

## 2014-01-24 VITALS — Ht 68.0 in | Wt 220.3 lb

## 2014-01-24 DIAGNOSIS — E782 Mixed hyperlipidemia: Secondary | ICD-10-CM

## 2014-01-24 DIAGNOSIS — Z6833 Body mass index (BMI) 33.0-33.9, adult: Secondary | ICD-10-CM | POA: Insufficient documentation

## 2014-01-24 DIAGNOSIS — E669 Obesity, unspecified: Secondary | ICD-10-CM | POA: Insufficient documentation

## 2014-01-24 DIAGNOSIS — Z713 Dietary counseling and surveillance: Secondary | ICD-10-CM | POA: Insufficient documentation

## 2014-01-24 DIAGNOSIS — E785 Hyperlipidemia, unspecified: Secondary | ICD-10-CM | POA: Insufficient documentation

## 2014-01-24 NOTE — Patient Instructions (Signed)
Plan:  Aim for 2 Carb Choices per meal (30 grams) +/- 1 either way  Aim for 0-1 Carbs per snack if hungry  Include lean protein in moderation with your meals and snacks Consider reading food labels for Total Carbohydrate of foods Consider  increasing your activity level by doing Arm Chair Exercises (check videos on [employer]) daily as tolerated

## 2014-01-24 NOTE — Progress Notes (Signed)
  Medical Nutrition Therapy:  Appt start time: 0930 end time:  1030.  Assessment:  Primary concerns today: patient her for hyperlipidemia and obesity. She is now living with her mother who has dementia and she is her primary care giver. She is interested in losing weight to help with hyperlipidemia and BG control. She has [employer]ased Fit Bit and is walking each day. She has plantar fasciatus which is painful to walk on. She does the food shopping and preparation.   Preferred Learning Style:   No preference indicated   Learning Readiness:   Ready  Change in progress  MEDICATIONS: see list   DIETARY INTAKE:  24-hr recall:  B ( AM): does not enjoy breakfast, used to not eat until dinner meal. NOW: special K bar and water OR crackers with PNB Snk ( AM): no  L ( PM): Yogurt OR  Fresh fruit Snk ( PM): Fiber bar  D ( PM): whole grain flat bread with lean meat and vegetables with a salad Snk ( PM): no Beverages: water, diet [facility],   Usual physical activity: has been walking 2000 to 4000 steps each day according to her [employer] data. She was able to do 10,000 one day but was too painful on her foot  Estimated energy needs: 1200 calories 135 g carbohydrates 90 g protein 33 g fat  Progress Towards Goal(s):  In progress.   Nutritional Diagnosis:  NI-1.5 Excessive energy intake As related to activity level.  As evidenced by BMI of 33.6    Intervention:  Nutrition counseling and hyperlipidemia education initiated. Discussed Carb Counting as method of portion control, reading food labels, and benefits of increased activity. Also reviewed rationale of limited Saturated and Trans Fats in view of hyperlipidemia  Plan:  Aim for 2 Carb Choices per meal (30 grams) +/- 1 either way  Aim for 0-1 Carbs per snack if hungry  Include lean protein in moderation with your meals and snacks Consider reading food labels for Total Carbohydrate of foods Consider  increasing your activity level by  doing Arm Chair Exercises (check videos on U-Tube) daily as tolerated  Teaching Method Utilized: Visual, Auditory and Hands on  Handouts given during visit include: Carb Counting and Food Label handouts Meal Plan Card Menu Planner  Barriers to learning/adherence to lifestyle change: limited ability to exericse  Demonstrated degree of understanding via:  Teach Back   Monitoring/Evaluation:  Dietary intake, exercise, reading food labels, and body weight in 1 month(s).

## 2014-02-22 ENCOUNTER — Other Ambulatory Visit: Payer: Self-pay

## 2014-02-22 ENCOUNTER — Encounter: Payer: Self-pay | Admitting: Obstetrics & Gynecology

## 2014-02-22 DIAGNOSIS — Z1231 Encounter for screening mammogram for malignant neoplasm of breast: Secondary | ICD-10-CM

## 2014-03-01 ENCOUNTER — Encounter: Payer: Medicare Other | Attending: Physician Assistant | Admitting: *Deleted

## 2014-03-01 ENCOUNTER — Encounter: Payer: Self-pay | Admitting: *Deleted

## 2014-03-01 VITALS — Ht 68.0 in | Wt 214.4 lb

## 2014-03-01 DIAGNOSIS — Z6833 Body mass index (BMI) 33.0-33.9, adult: Secondary | ICD-10-CM | POA: Insufficient documentation

## 2014-03-01 DIAGNOSIS — E669 Obesity, unspecified: Secondary | ICD-10-CM | POA: Insufficient documentation

## 2014-03-01 DIAGNOSIS — E782 Mixed hyperlipidemia: Secondary | ICD-10-CM

## 2014-03-01 DIAGNOSIS — E785 Hyperlipidemia, unspecified: Secondary | ICD-10-CM | POA: Insufficient documentation

## 2014-03-01 DIAGNOSIS — Z713 Dietary counseling and surveillance: Secondary | ICD-10-CM | POA: Insufficient documentation

## 2014-03-01 NOTE — Patient Instructions (Signed)
Plan:  Aim for 2 Carb Choices per meal (30 grams) +/- 1 either way  Aim for 0-1 Carbs per snack if hungry  Include lean protein in moderation with your meals and snacks Continue reading food labels for Total Carbohydrate of foods Continue with your activity level by doing Arm Chair Exercises (check videos on U-Tube) daily as tolerated Check into option of swimming as another activity opportunity Consider putting food on dish or paper towel instead of eating out of the container for better portion control Consider picking a place in your home for eating without distraction.

## 2014-03-01 NOTE — Progress Notes (Signed)
  Medical Nutrition Therapy:  Appt start time: 1000 end time:  1030.  Assessment:  Primary concerns today: patient her for hyperlipidemia and obesity follow up visit. She is pleased and surprised at the 6 pound weight loss in past 4 weeks.She is not interested in cooking full meals due to her many responsibilities caring for her mother with dementia, so she is getting lean deli meats and using flat bread instead of cooking large meals. Also using Special K bars, string cheese, and protein shakes. More PNB on crackers and apples. She would like ideas for more variety of easy to prepare foods. She is unable to walk as much as she would like due to pain from plantar fasciatus. She is interested in pool exercises if she could get someone to watch her Mom. She continues to use her Fit Bit to track some of her activity level.  Preferred Learning Style:   No preference indicated   Learning Readiness:   Ready  Change in progress  MEDICATIONS: see list   DIETARY INTAKE:  24-hr recall:  B ( AM): does not enjoy breakfast, used to not eat until dinner meal. NOW: special K bar and water OR crackers with PNB Snk ( AM): no  L ( PM): Yogurt OR  Fresh fruit Snk ( PM): Fiber bar  D ( PM): whole grain flat bread with lean meat and vegetables with a salad Snk ( PM): no Beverages: water, diet [facility],   Usual physical activity: has been walking as able each day according to her [employer] data. She has implemented some Arm Chair exercises since our last visit.  Estimated energy needs: 1200 calories 135 g carbohydrates 90 g protein 33 g fat  Progress Towards Goal(s):  In progress.   Nutritional Diagnosis:  NI-1.5 Excessive energy intake As related to activity level.  As evidenced by BMI of 33.6, now down to 32.7.    Intervention:  Nutrition counseling and hyperlipidemia education continued. Suggested methods of portion control, cooking larger portions of meals that can be frozen and heated later  and encouraged her to follow up on a Day Care for her Mom so she can have some time for herself including possibly pool exercises.   Plan:  Aim for 2 Carb Choices per meal (30 grams) +/- 1 either way  Aim for 0-1 Carbs per snack if hungry  Include lean protein in moderation with your meals and snacks Continue reading food labels for Total Carbohydrate of foods Continue with your activity level by doing Arm Chair Exercises (check videos on U-Tube) daily as tolerated Check into option of swimming as another activity opportunity Consider putting food on dish or paper towel instead of eating out of the container for better portion control Consider picking a place in your home for eating without distraction.   Teaching Method Utilized: Visual, Auditory   Handouts given during visit include: Snack List  Barriers to learning/adherence to lifestyle change: limited ability to exercise due to foot pain, and responsibility of caring for her Mom  Demonstrated degree of understanding via:  Teach Back   Monitoring/Evaluation:  Dietary intake, exercise, reading food labels, and body weight in 1 month(s).

## 2014-03-03 ENCOUNTER — Inpatient Hospital Stay: Admission: RE | Admit: 2014-03-03 | Payer: Medicare Other | Source: Ambulatory Visit

## 2014-03-07 ENCOUNTER — Ambulatory Visit
Admission: RE | Admit: 2014-03-07 | Discharge: 2014-03-07 | Disposition: A | Discharge status: Home / Self Care | Payer: Medicare Other | Source: Ambulatory Visit

## 2014-03-07 DIAGNOSIS — Z1231 Encounter for screening mammogram for malignant neoplasm of breast: Secondary | ICD-10-CM

## 2014-03-09 ENCOUNTER — Other Ambulatory Visit: Payer: Self-pay | Admitting: Obstetrics & Gynecology

## 2014-03-09 DIAGNOSIS — R928 Other abnormal and inconclusive findings on diagnostic imaging of breast: Secondary | ICD-10-CM

## 2014-03-13 ENCOUNTER — Ambulatory Visit
Admission: RE | Admit: 2014-03-13 | Discharge: 2014-03-13 | Disposition: A | Discharge status: Home / Self Care | Payer: Medicare Other | Source: Ambulatory Visit | Attending: Obstetrics & Gynecology | Admitting: Obstetrics & Gynecology

## 2014-03-13 ENCOUNTER — Other Ambulatory Visit: Payer: Medicare Other

## 2014-03-13 DIAGNOSIS — R928 Other abnormal and inconclusive findings on diagnostic imaging of breast: Secondary | ICD-10-CM

## 2014-03-13 DIAGNOSIS — M722 Plantar fascial fibromatosis: Secondary | ICD-10-CM

## 2014-03-13 HISTORY — DX: Plantar fascial fibromatosis: M72.2

## 2014-03-14 NOTE — Addendum Note (Signed)
Addended by: Quentin Mulling R on: 03/14/2014 03:17 PM   Modules accepted: Orders

## 2014-03-16 ENCOUNTER — Telehealth: Payer: Self-pay | Admitting: Internal Medicine

## 2014-03-16 NOTE — Telephone Encounter (Signed)
[facility] called to notify our practice that patient has been discharged from their practice due to no shows and cancellations. Patient advise [place], that she will just do yoga.   Thank you, Katrina Webb [facility] [phone] Fax [phone]

## 2014-03-27 ENCOUNTER — Ambulatory Visit: Payer: Medicare Other | Admitting: Obstetrics & Gynecology

## 2014-04-19 ENCOUNTER — Ambulatory Visit: Payer: Medicare Other | Admitting: Obstetrics & Gynecology

## 2014-04-28 ENCOUNTER — Ambulatory Visit: Payer: Medicare Other | Admitting: *Deleted

## 2014-05-05 ENCOUNTER — Ambulatory Visit: Payer: Medicare Other | Admitting: Obstetrics & Gynecology

## 2014-05-24 NOTE — Telephone Encounter (Signed)
close

## 2014-05-25 ENCOUNTER — Encounter: Payer: Self-pay | Admitting: Obstetrics & Gynecology

## 2014-05-25 ENCOUNTER — Ambulatory Visit: Payer: Medicare Other | Admitting: Obstetrics & Gynecology

## 2014-05-28 ENCOUNTER — Other Ambulatory Visit: Payer: Self-pay | Admitting: Internal Medicine

## 2014-05-29 ENCOUNTER — Telehealth: Payer: Self-pay | Admitting: Obstetrics & Gynecology

## 2014-05-29 NOTE — Telephone Encounter (Signed)
Patient Susan Bartlett an aex appt 05/25/14 with dr Hyacinth Meekermiller. Patient says she had a death in the family and that is why she missed her appt. Said she tried to call but no one would answer the phone or not message was coming up.

## 2014-06-05 NOTE — Telephone Encounter (Signed)
Please advise patient [facility] waived based on her situation, but please remind patient of office policy in the future. Thank you, Susan Bartlett

## 2014-06-06 NOTE — Telephone Encounter (Signed)
Lm letting pt know that we waived her fee and to remind of policy. lmtcb if any questions

## 2014-06-20 ENCOUNTER — Encounter: Payer: Self-pay | Admitting: *Deleted

## 2014-06-20 ENCOUNTER — Ambulatory Visit: Payer: Medicare Other | Admitting: *Deleted

## 2014-06-28 ENCOUNTER — Encounter: Payer: Self-pay | Admitting: Obstetrics & Gynecology

## 2014-06-28 ENCOUNTER — Ambulatory Visit (INDEPENDENT_AMBULATORY_CARE_PROVIDER_SITE_OTHER): Payer: Medicare Other | Admitting: Obstetrics & Gynecology

## 2014-06-28 VITALS — BP 120/62 | HR 76 | Resp 18 | Ht 66.75 in | Wt 207.0 lb

## 2014-06-28 DIAGNOSIS — Z0000 Encounter for general adult medical examination without abnormal findings: Secondary | ICD-10-CM

## 2014-06-28 DIAGNOSIS — E559 Vitamin D deficiency, unspecified: Secondary | ICD-10-CM

## 2014-06-28 DIAGNOSIS — Z01419 Encounter for gynecological examination (general) (routine) without abnormal findings: Secondary | ICD-10-CM

## 2014-06-28 DIAGNOSIS — Z800 Family history of malignant neoplasm of digestive organs: Secondary | ICD-10-CM

## 2014-06-28 DIAGNOSIS — Z124 Encounter for screening for malignant neoplasm of cervix: Secondary | ICD-10-CM

## 2014-06-28 MED ORDER — VITAMIN D (ERGOCALCIFEROL) 1.25 MG (50000 UNIT) PO CAPS: 50000.0000 [IU] | ORAL_CAPSULE | ORAL | Status: DC

## 2014-06-28 NOTE — Progress Notes (Signed)
48 y.o. G1P1 SingleCaucasianF here for annual exam.  No vaginal bleeding.  Lots of stressors with mother's worsening memory.  Saw nutritionist in march due to pre-diabetes.  Using a fit bit.  Nutritionist has really helped.  Has been walking a lot.  Ended up with knee pain.  Has fracture in right knee.  [facility] [facility].  Now having some shin pain.  Using an insert in her shoe.  Does have plantar fasciitis.    PCP:  Dr. Oneta Rack.  Labs with him yearly.    Patient's last menstrual period was 08/04/2012.          Sexually active: No.  The current method of family planning is post menopausal status.    Exercising: Yes.    Walking Smoker:  yes  Health Maintenance: Pap: 01/2013 Neg History of abnormal Pap:  no MMG: 03/08/14 BIRADS0, Incomplete, [place] Right inc Axila 03/13/14.  F/U 6 months.  BIRADS3:Probably Benign.  Colonoscopy: 2008 Repeat in 5 years  BMD:  2008 TDaP: 2014 Screening Labs: PCP, Hb today: PCP, Urine today: PCP   reports that she has been smoking Cigarettes.  She has been smoking about 1.00 pack per day. She has never used smokeless tobacco. She reports that she does not drink alcohol or use illicit drugs.  Past Medical History  Diagnosis Date  . Hepatomegaly   . Hemorrhoids 2008  . Anxiety   . Panic disorder   . Anal fissure 1994  . Depression   . Hypothyroidism   . Obesity   . Tobacco abuse     not tolerated Chantix in the past  . Endometriosis   . Fracture 08/2011    fracture left wrist and elbow  . OCD (obsessive compulsive disorder)   . Mixed hyperlipidemia   . Vitamin D deficiency   . RLS (restless legs syndrome) 2014    vis sleep study- no sleep apnea  . Plantar fasciitis, right 03/2014    Past Surgical History  Procedure Laterality Date  . Colonoscopy  03/25/2007    small external hemorroids  . Hemorroidectomy    . Cholecystectomy    . Left oophorectomy      Current Outpatient Prescriptions  Medication Sig Dispense Refill  . clonazePAM (KLONOPIN) 1 MG tablet  Take 2 mg by mouth 3 (three) times daily. Take 1/2 by mouth every am, 1/2 at lunch, and 1 every night      . ibuprofen (ADVIL,MOTRIN) 200 MG tablet Take 200 mg by mouth every 6 (six) hours as needed.      . pravastatin (PRAVACHOL) 40 MG tablet TAKE ONE TABLET BY MOUTH ONCE DAILY  90 tablet  1  . sertraline (ZOLOFT) 100 MG tablet Take 200 mg by mouth daily.       Marland Kitchen gabapentin (NEURONTIN) 100 MG capsule Take 200 mg by mouth 2 (two) times daily as needed.        No current facility-administered medications for this visit.    Family History  Problem Relation Age of Onset  . Colon cancer Maternal Grandmother 50  . Breast cancer Mother   . Diabetes Mother   . Drug abuse Brother     and alcohol  . Cirrhosis Father     alcohol  . Heart disease Father     MI  . Alcohol abuse Sister     ROS:  Pertinent items are noted in HPI.  Otherwise, a comprehensive ROS was negative.  Exam:   BP 120/62  Pulse 76  Resp 18  Ht 5' 6.75" (1.695 m)  Wt 207 lb (93.895 kg)  BMI 32.68 kg/m2  LMP 08/04/2012  Weight change: -22#   Height: 5' 6.75" (169.5 cm)  Ht Readings from Last 3 Encounters:  06/28/14 5' 6.75" (1.695 m)  03/01/14  (1.727 m)  01/24/14  (1.727 m)    General appearance: alert, cooperative and appears stated age Head: Normocephalic, without obvious abnormality, atraumatic Neck: no adenopathy, supple, symmetrical, trachea midline and thyroid normal to inspection and palpation Lungs: clear to auscultation bilaterally Breasts: normal appearance, no masses or tenderness Heart: regular rate and rhythm Abdomen: soft, non-tender; bowel sounds normal; no masses,  no organomegaly Extremities: extremities normal, atraumatic, no cyanosis or edema Skin: Skin color, texture, turgor normal. No rashes or lesions Lymph nodes: Cervical, supraclavicular, and axillary nodes normal. No abnormal inguinal nodes palpated Neurologic: Grossly normal   Pelvic: External genitalia:  no lesions               Urethra:  normal appearing urethra with no masses, tenderness or lesions              Bartholins and Skenes: normal                 Vagina: normal appearing vagina with normal color and discharge, no lesions              Cervix: no lesions              Pap taken: Yes Bimanual Exam:  Uterus:  normal size, contour, position, consistency, mobility, non-tender              Adnexa: normal adnexa and no mass, fullness, tenderness               Rectovaginal: Confirms               Anus:  normal sphincter tone, no lesions  A:  Well Woman with normal exam  PMP, no HRT  Smoker  Obesity with 20# weight loss Anxiety/Panic disorder  Hypothyroidism  P:   Mammogram done in spring.  6 months f/u scheduled. pap smear 2014.  Desires yearly pap. Labs with Dr. Astrid Divine. TSH today Vit D 50K weekly.  Repeat Vit D 12 weeks. Referral to Dr. Randa Evens for colonoscopy return annually or prn  An After Visit Summary was printed and given to the patient.

## 2014-06-29 ENCOUNTER — Telehealth: Payer: Self-pay | Admitting: Obstetrics & Gynecology

## 2014-06-29 DIAGNOSIS — Z1211 Encounter for screening for malignant neoplasm of colon: Secondary | ICD-10-CM

## 2014-06-29 LAB — IPS PAP TEST WITH REFLEX TO HPV

## 2014-06-29 LAB — TSH: TSH: 8.183 u[IU]/mL — ABNORMAL HIGH (ref 0.350–4.500)

## 2014-06-29 NOTE — Telephone Encounter (Signed)
Left message for patient to call back. Need to advise that per telephone call from [facility] with [facility], patient will need to contact their payment office [phone] before scheduling an appointment.

## 2014-06-30 ENCOUNTER — Telehealth: Payer: Self-pay | Admitting: Nurse Practitioner

## 2014-06-30 DIAGNOSIS — E039 Hypothyroidism, unspecified: Secondary | ICD-10-CM

## 2014-06-30 MED ORDER — LEVOTHYROXINE SODIUM 50 MCG PO TABS: 50.0000 ug | ORAL_TABLET | Freq: Every day | ORAL | Status: DC

## 2014-06-30 NOTE — Telephone Encounter (Signed)
Spoke with patient. Advised of results as seen below. Patient would like for Dr.Miller to start her on medication at this time. Advised patient would send a message over to covering provider as Dr.Miller is out of the office and would give her a call back once we have sent the rx in. Patient using [facility] pharmacy on file. TSH was 8.183.  Notes Recorded by Annamaria Boots, MD on 06/30/2014 at 7:11 AM 02 recall for pap. TSH is elevated. She's had some issues with this in the past. Does she want me to start treatment or send her to an endocrinologist?  Routing to Dr.Silva as covering Cc: Dr.Miller

## 2014-06-30 NOTE — Telephone Encounter (Signed)
Pt is returning a call to Sabrina °

## 2014-06-30 NOTE — Telephone Encounter (Signed)
Patient calling to see if results are ready from 06/28/14 visits/[place]

## 2014-06-30 NOTE — Telephone Encounter (Signed)
Ok for Synthroid 50 mcg daily.  Recheck thyroid function studies in 6 weeks at our office.  Patient can see endocrinology in the future if she desires.  Cc - Dr. Hyacinth Meeker

## 2014-06-30 NOTE — Telephone Encounter (Signed)
Synthroid 50 mcg #30 2RF sent to [facility] pharmacy on file. Left detailed message at number provided [phone]. Okay per ROI. Advised of message as seen below from Dr.Silva. Will call on Monday to schedule 6 week recheck.

## 2014-06-30 NOTE — Telephone Encounter (Signed)
Patient calling back [place].

## 2014-07-02 NOTE — Telephone Encounter (Signed)
Order placed for TSH 6 weeks.  Thanks.  Encounter closed.

## 2014-07-03 NOTE — Telephone Encounter (Signed)
Left message to call Omare Bilotta at [phone]. 

## 2014-07-03 NOTE — Telephone Encounter (Signed)
Advised patient to contact billing office at [facility].   Patient requests a referral to a different GI practice. Messaged provider.

## 2014-07-04 NOTE — Telephone Encounter (Signed)
Order placed for new referral.  Thanks for information.  Encounter closed.

## 2014-07-06 ENCOUNTER — Encounter: Payer: Self-pay | Admitting: Obstetrics & Gynecology

## 2014-07-06 NOTE — Telephone Encounter (Signed)
Spoke with Susan Bartlett who states referral to Dr.Brodie is in process. Patient will be contacted with referral information once she speaks with their office and is able to set up appointment.  Encounter previously closed.

## 2014-07-06 NOTE — Telephone Encounter (Signed)
Spoke with patient. Patient states that she sent a mychart message to Dr.Miller regarding her medication. Would like to know if Dr.Miller received the message and what she should do. Advised of message from Dr.Miller as seen below. Patient is agreeable. Patient states that she was referred to [facility] and is unable to be seen there and needed another referral. Advised I see that this was discussed with Dr.Miller and a new referral was supposed to be placed. Advised will speak with Dr.Miller and referrals coordinator to get her in to see endocrinology. Patient is agreeable. Did not make 6 week follow up lab appointment at this time because patient may not continue medication depending on side effects. Will need to see endocrinologist. Patient will notify us of side effects today on medication.  From Annamaria Boots, MD [[id number]] To [name] Composed 07/06/2014 8:39 AM For Delivery On 07/06/2014 8:39 AM Subject RE: Non-Urgent Medical Question Message Type Patient Medical Advice Request Read Status N By [name] has not read the message Message Body I would take it again today and see if you have the same side effect. If you have the same side effect, let me know and don't take it again. I will get you in with endocrinology. Thanks.   Dr. Hyacinth Meeker

## 2014-07-07 ENCOUNTER — Telehealth: Payer: Self-pay | Admitting: Obstetrics & Gynecology

## 2014-07-07 NOTE — Telephone Encounter (Signed)
Patient states she is returning someone from this offices phone call from earlier today regarding a referral to an endocrinologist. There is an open email and the patient states she was called about that. No open phone note. Please advise?

## 2014-07-09 ENCOUNTER — Other Ambulatory Visit: Payer: Self-pay | Admitting: Obstetrics & Gynecology

## 2014-07-09 DIAGNOSIS — E039 Hypothyroidism, unspecified: Secondary | ICD-10-CM

## 2014-07-10 NOTE — Telephone Encounter (Signed)
Please see e-mail messages from Dr. Hyacinth Meeker and patient.  Referral is for Dr. Talmage Nap.  Colonoscopy referral as well.  Patient is aware of plan by Dr. Hyacinth Meeker and referrals are pending.  Routing to provider for final review. Patient agreeable to disposition. Will close encounter

## 2014-07-12 ENCOUNTER — Telehealth: Payer: Self-pay | Admitting: Obstetrics & Gynecology

## 2014-07-12 DIAGNOSIS — R7989 Other specified abnormal findings of blood chemistry: Secondary | ICD-10-CM

## 2014-07-12 NOTE — Telephone Encounter (Signed)
Left message for patient to call back. Need to advise of appt with Dr Talmage NapBalan scheduled for 12.11.2015 @1000 . Patient can contact their office to see if they have any cancelled appts  to possibly schedule sooner.

## 2014-07-13 NOTE — Telephone Encounter (Signed)
Patient returned call. Advised patient of Dr Talmage NapBalan appt. Patient would like to know if Dr Hyacinth MeekerMiller recommends another endocrinologist, she feels this appt is too far out. Advised

## 2014-07-14 NOTE — Telephone Encounter (Signed)
This is Dr. Edward JollySilva stepping in to help Dr. Hyacinth MeekerMiller while she is out of the office.  Other endocrinologist would be:  Dr. Laurene FootmanSteven South, Dr. Leslie DalesAltheimer, or Dr. Sharl MaKerr. I would try Dr. Evlyn KannerSouth first to see about availability.

## 2014-07-17 NOTE — Telephone Encounter (Signed)
Dr Evlyn KannerSouth will be out of town until October 20th. Is this still too long for the patient to wait? If so, I will reach out to another provider.  Thank you, Martie LeeSabrina

## 2014-07-17 NOTE — Telephone Encounter (Signed)
Triage, please reach out to patient regarding this appointment and the appointment time. Will you confirm that she is already taking her Synthroid? This is a patient of Dr. Rondel BatonMiller's that I was assisting with in her absence last week.   Cc - Dr. Hyacinth MeekerMiller, Cathrine MusterSabrina Franklin

## 2014-07-17 NOTE — Telephone Encounter (Signed)
Faxed referral to Dr Evlyn KannerSouth. Will call regarding scheduling.

## 2014-07-18 NOTE — Telephone Encounter (Signed)
OK to close encounter.  Looks like everything is in the works.  Thanks.

## 2014-07-18 NOTE — Telephone Encounter (Signed)
Spoke with patient. Patient is willing to see Dr.South around the time of Oct 20th. Requests any appointment after 11am. Patient states that she had been taking her synthroid. Patient would like to know about referral to GI. Advised referral has been placed and is in process. Will be contacted by our referral coordinator or office we have referred her to regarding scheduling. Patient is agreeable. Left message for Toby at Dr.South's office to return call to get patient scheduled for appointment.

## 2014-07-18 NOTE — Telephone Encounter (Signed)
Spoke with Ronnald CollumJo Anne from Dr.South's office who states that Dr.South will have to review patient's information when he returns on Oct 20th before scheduling. Would you like patient to be seen with another provider?

## 2014-07-18 NOTE — Telephone Encounter (Signed)
Dr.Balan's first available was not until 09/22/14. Spoke with Amy at Dr.Altheimer's office who states he will need to review records first but first available is next week depending on review. Results and records sent to Dr.Altheimer's office with cover sheet and confirmation to [phone].

## 2014-07-18 NOTE — Telephone Encounter (Signed)
What about Dr. Talmage NapBalan?

## 2014-07-20 ENCOUNTER — Encounter: Payer: Self-pay | Admitting: Gastroenterology

## 2014-07-20 ENCOUNTER — Encounter: Payer: Self-pay | Admitting: Internal Medicine

## 2014-07-23 ENCOUNTER — Encounter: Payer: Self-pay | Admitting: Obstetrics & Gynecology

## 2014-07-24 NOTE — Telephone Encounter (Signed)
Yes.  Thank you for continuing to work on this.

## 2014-07-24 NOTE — Telephone Encounter (Signed)
Spoke with Westly PamLacy from Dr.Altheimer's office who states in the process of trying to get patient schedule they realized she has an insurance that Dr.Altheimer does not take and patient would have to pay in full to be seen at their office. Advised will let Dr.Miller know and we will go forth in getting patient scheduled. Thanked her for update.   Dr.Miller, patient is not able to be seen with Dr.Balan until 12/11. Dr.South will return on Oct 20th and has to review records before accepting patient. Dr.Altheimer does not accept the patient's insurance. Would you like me to place referral to Dr.Kerr at this time?

## 2014-07-25 NOTE — Telephone Encounter (Signed)
Left message for Victorino DikeJennifer at [facility] to get patient scheduled to see Dr.Kerr. Office visit notes, labs, medications, and insurance faxed with cover sheet to Dr.Kerr's office at [phone].

## 2014-07-26 NOTE — Telephone Encounter (Signed)
Referral placed to Dr[facility]Ellison at [facility]. Spoke with Selena BattenKim at Dr.Ellison's office. Appointment scheduled for Oct 23rd at 8:30am with Dr.Ellison. Spoke with patient. Patient is agreeable to appointment date, time, and location. Provided patient address and phone number to location [phone] [address]. Patient is agreeable.  Routing to provider for final review. Patient agreeable to disposition. Will close encounter ;

## 2014-07-26 NOTE — Telephone Encounter (Signed)
[facility]ardo AllEllison at [facility] is another option.  Can you try there?

## 2014-07-26 NOTE — Telephone Encounter (Signed)
Spoke with Susan MusterSabrina Bartlett in our office who states that [facility] is [facility] to be [facility] due to current bill with their practice.   Dr.Miller, Molli Knockkay to wait for review with Dr.South on Oct 20th? Is there another provider we could send her to? Please advise.

## 2014-07-28 ENCOUNTER — Telehealth: Payer: Self-pay | Admitting: Obstetrics & Gynecology

## 2014-07-28 NOTE — Telephone Encounter (Addendum)
Susan DikeJennifer says she is unable to schedule the referral to Dr.Kerr for this patient. Patient will need to contact [facility] department.

## 2014-07-31 NOTE — Telephone Encounter (Signed)
Patient has already been scheduled with [facility]. Please see previous phone note below.  Jannet AskewKaitlyn E Hines, RN at 07/26/2014 3:46 PM     Status: Signed        Referral placed to [facility]Ellison at [facility]. Spoke with Selena BattenKim at Dr.Ellison's office. Appointment scheduled for Oct 23rd at 8:30am with Dr.Ellison. Spoke with patient. Patient is agreeable to appointment date, time, and location. Provided patient address and phone number to location [phone] [address]. Patient is agreeable.  Routing to provider for final review. Patient agreeable to disposition. Will close encounter   Routing to provider for final review. Patient agreeable to disposition. Will close encounter

## 2014-08-04 ENCOUNTER — Ambulatory Visit: Payer: Medicare Other | Admitting: Endocrinology

## 2014-08-10 ENCOUNTER — Ambulatory Visit (INDEPENDENT_AMBULATORY_CARE_PROVIDER_SITE_OTHER): Payer: Medicare Other | Admitting: Endocrinology

## 2014-08-10 ENCOUNTER — Encounter: Payer: Self-pay | Admitting: Endocrinology

## 2014-08-10 VITALS — BP 120/82 | HR 80 | Temp 97.9°F | Ht 68.0 in | Wt 203.0 lb

## 2014-08-10 DIAGNOSIS — R200 Anesthesia of skin: Secondary | ICD-10-CM

## 2014-08-10 DIAGNOSIS — E039 Hypothyroidism, unspecified: Secondary | ICD-10-CM

## 2014-08-10 LAB — TSH: TSH: 2.678 u[IU]/mL (ref 0.350–4.500)

## 2014-08-10 LAB — VITAMIN B12: Vitamin B-12: 290 pg/mL (ref 211–911)

## 2014-08-10 NOTE — Patient Instructions (Addendum)
blood tests are being requested for you today.  We'll contact you with results.    You will probably need this medication for the rest of your life.   CC Dr Jennelle Humanottle.   I would be happy to see you back here whenever you want.           Hypothyroidism The thyroid is a large gland located in the lower front of your neck. The thyroid gland helps control metabolism. Metabolism is how your body handles food. It controls metabolism with the hormone thyroxine. When this gland is underactive (hypothyroid), it produces too little hormone.  CAUSES These include:   Absence or destruction of thyroid tissue.  Goiter due to iodine deficiency.  Goiter due to medications.  Congenital defects (since birth).  Problems with the pituitary. This causes a lack of TSH (thyroid stimulating hormone). This hormone tells the thyroid to turn out more hormone. SYMPTOMS  Lethargy (feeling as though you have no energy)  Cold intolerance  Weight gain (in spite of normal food intake)  Dry skin  Coarse hair  Menstrual irregularity (if severe, may lead to infertility)  Slowing of thought processes Cardiac problems are also caused by insufficient amounts of thyroid hormone. Hypothyroidism in the newborn is cretinism, and is an extreme form. It is important that this form be treated adequately and immediately or it will lead rapidly to retarded physical and mental development. DIAGNOSIS  To prove hypothyroidism, your caregiver may do blood tests and ultrasound tests. Sometimes the signs are hidden. It may be necessary for your caregiver to watch this illness with blood tests either before or after diagnosis and treatment. TREATMENT  Low levels of thyroid hormone are increased by using synthetic thyroid hormone. This is a safe, effective treatment. It usually takes about four weeks to gain the full effects of the medication. After you have the full effect of the medication, it will generally take another four  weeks for problems to leave. Your caregiver may start you on low doses. If you have had heart problems the dose may be gradually increased. It is generally not an emergency to get rapidly to normal. HOME CARE INSTRUCTIONS   Take your medications as your caregiver suggests. Let your caregiver know of any medications you are taking or start taking. Your caregiver will help you with dosage schedules.  As your condition improves, your dosage needs may increase. It will be necessary to have continuing blood tests as suggested by your caregiver.  Report all suspected medication side effects to your caregiver. SEEK MEDICAL CARE IF: Seek medical care if you develop:  Sweating.  Tremulousness (tremors).  Anxiety.  Rapid weight loss.  Heat intolerance.  Emotional swings.  Diarrhea.  Weakness. SEEK IMMEDIATE MEDICAL CARE IF:  You develop chest pain, an irregular heart beat (palpitations), or a rapid heart beat. MAKE SURE YOU:   Understand these instructions.  Will watch your condition.  Will get help right away if you are not doing well or get worse. Document Released: 09/29/2005 Document Revised: 12/22/2011 Document Reviewed: 05/19/2008 [facility] Patient Information 2015 [place], [place]. This information is not intended to replace advice given to you by your health care provider. Make sure you discuss any questions you have with your health care provider.

## 2014-08-10 NOTE — Progress Notes (Signed)
Subjective:    Patient ID: Susan Bartlett, female    DOB: 1966-06-19, 48 y.o.   MRN: [id number]  HPI Pt reports mild hypothyroidism was dx'ed in approx 2010.  she was prescribed thyroid hormone therapy, which she took for a few years, but has been off x 1-2 years.  she has never taken kelp or any other type of non-prescribed thyroid product.  she has never had thyroid imaging.  she has never had thyroid surgery, or XRT to the neck.  she has never been on amiodarone.  She took lithium, but only briefly, and many years ago.  She has moderate arthralgias throughout the body, and assoc difficulty with concentration.   Past Medical History  Diagnosis Date  . Hepatomegaly   . Hemorrhoids 2008  . Anxiety   . Panic disorder   . Anal fissure 1994  . Depression   . Hypothyroidism   . Obesity   . Tobacco abuse     not tolerated Chantix in the past  . Endometriosis   . Fracture 08/2011    fracture left wrist and elbow  . OCD (obsessive compulsive disorder)   . Mixed hyperlipidemia   . Vitamin D deficiency   . RLS (restless legs syndrome) 2014    vis sleep study- no sleep apnea  . Plantar fasciitis, right 03/2014    Past Surgical History  Procedure Laterality Date  . Colonoscopy  03/25/2007    small external hemorroids  . Hemorroidectomy    . Cholecystectomy    . Left oophorectomy      History   Social History  . Marital Status: Single    Spouse Name: N/A    Number of Children: 1  . Years of Education: N/A   Occupational History  . disabilty [employer]   Social History Main Topics  . Smoking status: Current Every Day Smoker -- 1.00 packs/day    Types: Cigarettes  . Smokeless tobacco: Never Used  . Alcohol Use: No  . Drug Use: No  . Sexual Activity: No   Other Topics Concern  . Not on file   Social History Narrative  . No narrative on file    Current Outpatient Prescriptions on File Prior to Visit  Medication Sig Dispense Refill  . clonazePAM (KLONOPIN) 1 MG tablet  Take 2 mg by mouth 3 (three) times daily. Take 1/2 by mouth every am, 1/2 at lunch, and 1 every night      . gabapentin (NEURONTIN) 100 MG capsule Take 200 mg by mouth 2 (two) times daily as needed.       Marland Kitchen. ibuprofen (ADVIL,MOTRIN) 200 MG tablet Take 200 mg by mouth every 6 (six) hours as needed.      Marland Kitchen. levothyroxine (SYNTHROID, LEVOTHROID) 50 MCG tablet Take 1 tablet (50 mcg total) by mouth daily.  30 tablet  2  . pravastatin (PRAVACHOL) 40 MG tablet TAKE ONE TABLET BY MOUTH ONCE DAILY  90 tablet  1  . sertraline (ZOLOFT) 100 MG tablet Take 200 mg by mouth daily.       . Vitamin D, Ergocalciferol, (DRISDOL) 50000 UNITS CAPS capsule Take 1 capsule (50,000 Units total) by mouth every 7 (seven) days.  4 capsule  13   No current facility-administered medications on file prior to visit.    No Known Allergies  Family History  Problem Relation Age of Onset  . Colon cancer Maternal Grandmother 50  . Breast cancer Mother   . Diabetes Mother   .  Drug abuse Brother     and alcohol  . Cirrhosis Father     alcohol  . Heart disease Father     MI  . Alcohol abuse Sister   . Thyroid disease Neg Hx     BP 120/82  Pulse 80  Temp(Src) 97.9 F (36.6 C) (Oral)  Ht 5\' 8"  (1.727 m)  Wt 203 lb (92.08 kg)  BMI 30.87 kg/m2  SpO2 97%  LMP 08/04/2012  Review of Systems denies hair loss, sob, constipation, numbness, visual loss, cold intolerance, and syncope.  Depression is moderately well-controlled.  She has a few muscle cramps.  She has lost weight, due to her efforts.  She has dry skin, easy bruising, and nasal congestion.     Objective:   Physical Exam VS: see vs page GEN: no distress HEAD: head: no deformity eyes: no periorbital swelling, no proptosis external nose and ears are normal mouth: no lesion seen NECK: supple, thyroid is not enlarged CHEST WALL: no deformity LUNGS:  Clear to auscultation.   CV: reg rate and rhythm, no murmur.  ABD: abdomen is soft, nontender.  no  hepatosplenomegaly.  not distended.  no hernia MUSCULOSKELETAL: muscle bulk and strength are grossly normal.  no obvious joint swelling.  gait is normal and steady.  EXTEMITIES: no deformity.  no edema.  Fingernails are brittle. PULSES: no carotid bruit NEURO:  cn 2-12 grossly intact.   readily moves all 4's.  sensation is intact to touch on all 4's.   SKIN:  Normal texture and temperature.  No rash or suspicious lesion is visible.   NODES:  None palpable at the neck.   PSYCH: alert, well-oriented.  Does not appear anxious nor depressed.  i have reviewed the following old records: Office notes   Lab Results  Component Value Date   TSH 8.183* 06/28/2014   T4TOTAL 8.7 02/03/2013   i reviewed electrocardiogram: 12/13/13    Assessment & Plan:  Chronic primary hypothyroidism, new to me.  Mild.     Patient is advised the following: Patient Instructions  blood tests are being requested for you today.  We'll contact you with results.    You will probably need this medication for the rest of your life.   CC Dr Jennelle Human.   I would be happy to see you back here whenever you want.           Hypothyroidism The thyroid is a large gland located in the lower front of your neck. The thyroid gland helps control metabolism. Metabolism is how your body handles food. It controls metabolism with the hormone thyroxine. When this gland is underactive (hypothyroid), it produces too little hormone.  CAUSES These include:   Absence or destruction of thyroid tissue.  Goiter due to iodine deficiency.  Goiter due to medications.  Congenital defects (since birth).  Problems with the pituitary. This causes a lack of TSH (thyroid stimulating hormone). This hormone tells the thyroid to turn out more hormone. SYMPTOMS  Lethargy (feeling as though you have no energy)  Cold intolerance  Weight gain (in spite of normal food intake)  Dry skin  Coarse hair  Menstrual irregularity (if severe, may  lead to infertility)  Slowing of thought processes Cardiac problems are also caused by insufficient amounts of thyroid hormone. Hypothyroidism in the newborn is cretinism, and is an extreme form. It is important that this form be treated adequately and immediately or it will lead rapidly to retarded physical and mental development. DIAGNOSIS  To  prove hypothyroidism, your caregiver may do blood tests and ultrasound tests. Sometimes the signs are hidden. It may be necessary for your caregiver to watch this illness with blood tests either before or after diagnosis and treatment. TREATMENT  Low levels of thyroid hormone are increased by using synthetic thyroid hormone. This is a safe, effective treatment. It usually takes about four weeks to gain the full effects of the medication. After you have the full effect of the medication, it will generally take another four weeks for problems to leave. Your caregiver may start you on low doses. If you have had heart problems the dose may be gradually increased. It is generally not an emergency to get rapidly to normal. HOME CARE INSTRUCTIONS   Take your medications as your caregiver suggests. Let your caregiver know of any medications you are taking or start taking. Your caregiver will help you with dosage schedules.  As your condition improves, your dosage needs may increase. It will be necessary to have continuing blood tests as suggested by your caregiver.  Report all suspected medication side effects to your caregiver. SEEK MEDICAL CARE IF: Seek medical care if you develop:  Sweating.  Tremulousness (tremors).  Anxiety.  Rapid weight loss.  Heat intolerance.  Emotional swings.  Diarrhea.  Weakness. SEEK IMMEDIATE MEDICAL CARE IF:  You develop chest pain, an irregular heart beat (palpitations), or a rapid heart beat. MAKE SURE YOU:   Understand these instructions.  Will watch your condition.  Will get help right away if you are not  doing well or get worse. Document Released: 09/29/2005 Document Revised: 12/22/2011 Document Reviewed: 05/19/2008 [facility]Exit[facility] Patient Information 2015 [place]ExitCare, [place]LLC. This information is not intended to replace advice given to you by your health care provider. Make sure you discuss any questions you have with your health care provider.

## 2014-08-14 ENCOUNTER — Encounter: Payer: Self-pay | Admitting: Endocrinology

## 2014-09-20 ENCOUNTER — Ambulatory Visit: Payer: Medicare Other | Admitting: Gastroenterology

## 2014-09-21 ENCOUNTER — Encounter: Payer: Medicare Other | Admitting: Internal Medicine

## 2014-10-12 ENCOUNTER — Telehealth: Payer: Self-pay | Admitting: *Deleted

## 2014-10-12 NOTE — Telephone Encounter (Signed)
Recall Notes:  6 Month mmg recall/Recommend right diagnostic mammography in 6 months/tf  Last MMG:  03/07/14 with ultrsaound 03/13/14, A complicated cyst versus focally ectatic duct in the right breast at 9 o'clock 1 cm from the nipple is the likely sonographic correlate to the previously noted mass on screening mammography.  Pt overdue for mammogram recall.  Due 09/2014.  No follow up appointments in EPIC.  Please call patient to schedule.  [facility].

## 2014-10-17 NOTE — Telephone Encounter (Signed)
Left Message To Call Back  

## 2014-10-19 NOTE — Telephone Encounter (Signed)
Left Message To Call Back  

## 2014-10-27 ENCOUNTER — Encounter: Payer: Self-pay | Admitting: *Deleted

## 2014-10-27 NOTE — Telephone Encounter (Signed)
Letter signed and out of MMG recall.

## 2014-10-27 NOTE — Telephone Encounter (Signed)
Called patient x2 no callback 

## 2014-10-27 NOTE — Telephone Encounter (Signed)
Pt due for MMG recall. Pt has been contacted twice with no return call.  No appt made [facility]. Letter routed to Dr. Hyacinth MeekerMiller. Please advise recall.

## 2014-11-01 NOTE — Telephone Encounter (Signed)
Letter mailed.  Pt removed from MMG recall.  Closing encounter.

## 2014-11-07 ENCOUNTER — Other Ambulatory Visit: Payer: Self-pay | Admitting: Obstetrics and Gynecology

## 2014-11-07 NOTE — Addendum Note (Signed)
Addended by: Dion BodyBELTRAN, REINA C on: 11/07/2014 04:55 PM   Modules accepted: Orders

## 2014-11-07 NOTE — Telephone Encounter (Addendum)
Medication refill request: Synthroid 50 mcg Last AEX:  06/28/14 Next AEX: Not scheduled  Last MMG (if hormonal medication request): 03/08/14 BIRADS0:Incomplete. 03/13/14 US Right inc axilla BIRADS3: Probably benign  Refill authorized: 06/30/14 #30/2R. Patient's PCP managing Hypothyroidism? OV 08/10/14 with Dr. Everardo AllEllison [facility].

## 2014-11-07 NOTE — Telephone Encounter (Signed)
Called patient to clarified if she is going to be seeing Dr. Everardo AllEllison for her condition. Patient states she wants to keep seeing Dr. Hyacinth MeekerMiller, patient states she trusts Dr. Hyacinth MeekerMiller more than PCP. Patient has not future appts but willing to come if Dr. Hyacinth MeekerMiller needs to check her TSH again.   She states she has been off of med for about a month because she is been having problems sleeping. She has 4 pills left.    Please advise.

## 2014-11-09 ENCOUNTER — Telehealth: Payer: Self-pay | Admitting: Obstetrics & Gynecology

## 2014-11-09 ENCOUNTER — Ambulatory Visit (AMBULATORY_SURGERY_CENTER): Payer: Self-pay

## 2014-11-09 VITALS — Ht 68.0 in | Wt 202.8 lb

## 2014-11-09 DIAGNOSIS — Z800 Family history of malignant neoplasm of digestive organs: Secondary | ICD-10-CM

## 2014-11-09 NOTE — Telephone Encounter (Signed)
Pt says she is still waiting to hear back from nurse regarding her synthroid refill and she received a letter to schedule her mammogram.

## 2014-11-09 NOTE — Telephone Encounter (Signed)
Dion BodyReina C Beltran, CMA at 11/07/2014 4:42 PM     Status: Signed       Expand All Collapse All   Called patient to clarified if she is going to be seeing Dr. Everardo AllEllison for her condition. Patient states she wants to keep seeing Dr. Hyacinth MeekerMiller, patient states she trusts Dr. Hyacinth MeekerMiller more than PCP. Patient has not future appts but willing to come if Dr. Hyacinth MeekerMiller needs to check her TSH again.   She states she has been off of med for about a month because she is been having problems sleeping. She has 4 pills left.   Please advise.             Dion BodyReina C Beltran, CMA at 11/07/2014 4:24 PM     Status: Addendum       Expand All Collapse All   Medication refill request: Synthroid 50 mcg Last AEX: 06/28/14 Next AEX: Not scheduled  Last MMG (if hormonal medication request): 03/08/14 BIRADS0:Incomplete. 03/13/14 US Right inc axilla BIRADS3: Probably benign  Refill authorized: 06/30/14 #30/2R. Patient's PCP managing Hypothyroidism? OV 08/10/14 with Dr. Everardo AllEllison [facility].

## 2014-11-09 NOTE — Telephone Encounter (Signed)
Was the synthroid the reason she was having trouble sleeping?  Can you check before I do a RF?  May need lower dosage.

## 2014-11-09 NOTE — Progress Notes (Signed)
No allergies to eggs or soy No home oxygen No diet/weight loss meds No past problems with anesthesia  Has email  Emmi instructions given for colonoscopy 

## 2014-11-10 NOTE — Telephone Encounter (Signed)
Patient stated she was taking care of her mom that's why she couldn't sleep well.

## 2014-11-14 ENCOUNTER — Other Ambulatory Visit: Payer: Self-pay | Admitting: Obstetrics & Gynecology

## 2014-11-14 MED ORDER — LEVOTHYROXINE SODIUM 50 MCG PO TABS: 50.0000 ug | ORAL_TABLET | Freq: Every day | ORAL | Status: DC

## 2014-11-14 NOTE — Telephone Encounter (Signed)
RF was authorized.  Encounter can be closed.

## 2014-11-15 NOTE — Telephone Encounter (Signed)
LM for pt stating Rx was sent to pharmacy.

## 2014-11-17 ENCOUNTER — Ambulatory Visit (AMBULATORY_SURGERY_CENTER): Payer: Medicare Other | Admitting: Internal Medicine

## 2014-11-17 ENCOUNTER — Encounter: Payer: Self-pay | Admitting: Internal Medicine

## 2014-11-17 VITALS — BP 122/77 | HR 69 | Temp 98.5°F | Resp 15 | Ht 68.0 in | Wt 202.0 lb

## 2014-11-17 DIAGNOSIS — Z1211 Encounter for screening for malignant neoplasm of colon: Secondary | ICD-10-CM

## 2014-11-17 DIAGNOSIS — Z800 Family history of malignant neoplasm of digestive organs: Secondary | ICD-10-CM

## 2014-11-17 MED ORDER — SODIUM CHLORIDE 0.9 % IV SOLN: 500.0000 mL | INTRAVENOUS | Status: DC

## 2014-11-17 NOTE — Patient Instructions (Addendum)
 Your colonoscopy was normal.  Next routine colonoscopy in 10 years - 2026  I appreciate the opportunity to care for you. Niketa Turner E. Marcelyn Ruppe, MD, FACG   YOU HAD AN ENDOSCOPIC PROCEDURE TODAY AT THE [facility]: Refer to the procedure report that was given to you for any specific questions about what was found during the examination.  If the procedure report does not answer your questions, please call your gastroenterologist to clarify.  If you requested that your care partner not be given the details of your procedure findings, then the procedure report has been included in a sealed envelope for you to review at your convenience later.  YOU SHOULD EXPECT: Some feelings of bloating in the abdomen. Passage of more gas than usual.  Walking can help get rid of the air that was put into your GI tract during the procedure and reduce the bloating. If you had a lower endoscopy (such as a colonoscopy or flexible sigmoidoscopy) you may notice spotting of blood in your stool or on the toilet paper. If you underwent a bowel prep for your procedure, then you may not have a normal bowel movement for a few days.  DIET: Your first meal following the procedure should be a light meal and then it is ok to progress to your normal diet.  A half-sandwich or bowl of soup is an example of a good first meal.  Heavy or fried foods are harder to digest and may make you feel nauseous or bloated.  Likewise meals heavy in dairy and vegetables can cause extra gas to form and this can also increase the bloating.  Drink plenty of fluids but you should avoid alcoholic beverages for 24 hours.  ACTIVITY: Your care partner should take you home directly after the procedure.  You should plan to take it easy, moving slowly for the rest of the day.  You can resume normal activity the day after the procedure however you should NOT DRIVE or use heavy machinery for 24 hours (because of the sedation medicines used during the  test).    SYMPTOMS TO REPORT IMMEDIATELY: A gastroenterologist can be reached at any hour.  During normal business hours, 8:30 AM to 5:00 PM Monday through Friday, call [phone].  After hours and on weekends, please call the GI answering service at [phone] who will take a message and have the physician on call contact you.   Following lower endoscopy (colonoscopy or flexible sigmoidoscopy):  Excessive amounts of blood in the stool  Significant tenderness or worsening of abdominal pains  Swelling of the abdomen that is new, acute  Fever of 100F or higher  FOLLOW UP: If any biopsies were taken you will be contacted by phone or by letter within the next 1-3 weeks.  Call your gastroenterologist if you have not heard about the biopsies in 3 weeks.  Our staff will call the home number listed on your records the next business day following your procedure to check on you and address any questions or concerns that you may have at that time regarding the information given to you following your procedure. This is a courtesy call and so if there is no answer at the home number and we have not heard from you through the emergency physician on call, we will assume that you have returned to your regular daily activities without incident.  SIGNATURES/CONFIDENTIALITY: You and/or your care partner have signed paperwork which will be entered into your electronic medical record.    These signatures attest to the fact that that the information above on your After Visit Summary has been reviewed and is understood.  Full responsibility of the confidentiality of this discharge information lies with you and/or your care-partner. 

## 2014-11-17 NOTE — Op Note (Signed)
[facility] [address] [place], [id number]   COLONOSCOPY PROCEDURE REPORT  PATIENT: Susan KosBain, [name]  MR#: [id number] BIRTHDATE: Dec 05, 1965 , 49  yrs. old GENDER: female ENDOSCOPIST: Iva Booparl E Mardy Hoppe, MD, [facility] CenterFACG PROCEDURE DATE:  11/17/2014 PROCEDURE:   Colonoscopy, screening First Screening Colonoscopy - Avg.  risk and is 50 yrs.  old or older - No.  Prior Negative Screening - Now for repeat screening. Less than 10 yrs Prior Negative Screening - Now for repeat screening.  Other: See Comments  History of Adenoma - Now for follow-up colonoscopy & has been > or = to 3 yrs.  N/A  Polyps Removed Today? No.  Polyps Removed Today? No.  Polyps Removed Today? No.  Recommend repeat exam, <10 yrs? No. ASA CLASS:   Class II INDICATIONS:average risk for colorectal cancer. MEDICATIONS: Propofol 280 mg IV and Monitored anesthesia care  DESCRIPTION OF PROCEDURE:   After the risks benefits and alternatives of the procedure were thoroughly explained, informed consent was obtained.  The digital rectal exam revealed no abnormalities of the rectum.   The LB [id number]  endoscope was introduced through the anus and advanced to the cecum, which was identified by both the appendix and ileocecal valve. No adverse events experienced.   The quality of the prep was good, using MiraLax  The instrument was then slowly withdrawn as the colon was fully examined.      COLON FINDINGS: A normal appearing cecum, ileocecal valve, and appendiceal orifice were identified.  The ascending, transverse, descending, sigmoid colon, and rectum appeared unremarkable. Retroflexed views revealed no abnormalities. The time to cecum=4 minutes 19 seconds.  Withdrawal time=10 minutes 45 seconds.  The scope was withdrawn and the procedure completed. COMPLICATIONS: There were no immediate complications.  ENDOSCOPIC IMPRESSION: Normal colonoscopy  RECOMMENDATIONS: Repeat colonoscopy 10 years. (had first  colonoscopy before 50 as a maternal grandmother had colon cancer - mom had polyps - per current guidelines I think 10 years to next colonoscopy appropriate)  eSigned:  Iva Booparl E Bless Lisenby, MD, [facility] IncFACG 11/17/2014 9:10 AM   cc: The Patient and Leda QuailSuzanne Miller, MD

## 2014-11-17 NOTE — Progress Notes (Signed)
Procedure ends, to recovery, report given and VSS. 

## 2014-11-20 ENCOUNTER — Telehealth: Payer: Self-pay | Admitting: *Deleted

## 2014-11-20 NOTE — Telephone Encounter (Signed)
  Follow up Call-  No answer left message to call if questions or concerns.

## 2014-11-28 ENCOUNTER — Encounter: Payer: Self-pay | Admitting: Physician Assistant

## 2014-12-11 ENCOUNTER — Encounter: Payer: Self-pay | Admitting: Physician Assistant

## 2014-12-14 ENCOUNTER — Encounter: Payer: Self-pay | Admitting: Physician Assistant

## 2014-12-19 ENCOUNTER — Ambulatory Visit: Payer: Medicare Other | Admitting: Obstetrics & Gynecology

## 2015-01-03 ENCOUNTER — Other Ambulatory Visit: Payer: Self-pay | Admitting: Internal Medicine

## 2015-01-16 ENCOUNTER — Encounter: Payer: Self-pay | Admitting: Physician Assistant

## 2015-01-16 ENCOUNTER — Ambulatory Visit (INDEPENDENT_AMBULATORY_CARE_PROVIDER_SITE_OTHER): Payer: Medicare Other | Admitting: Physician Assistant

## 2015-01-16 VITALS — BP 132/72 | HR 80 | Temp 97.7°F | Resp 16 | Ht 67.5 in | Wt 208.0 lb

## 2015-01-16 DIAGNOSIS — R7303 Prediabetes: Secondary | ICD-10-CM

## 2015-01-16 DIAGNOSIS — F329 Major depressive disorder, single episode, unspecified: Secondary | ICD-10-CM

## 2015-01-16 DIAGNOSIS — F410 Panic disorder [episodic paroxysmal anxiety] without agoraphobia: Secondary | ICD-10-CM

## 2015-01-16 DIAGNOSIS — F32A Depression, unspecified: Secondary | ICD-10-CM

## 2015-01-16 DIAGNOSIS — Z0001 Encounter for general adult medical examination with abnormal findings: Secondary | ICD-10-CM

## 2015-01-16 DIAGNOSIS — E039 Hypothyroidism, unspecified: Secondary | ICD-10-CM | POA: Diagnosis not present

## 2015-01-16 DIAGNOSIS — E538 Deficiency of other specified B group vitamins: Secondary | ICD-10-CM | POA: Diagnosis not present

## 2015-01-16 DIAGNOSIS — R7309 Other abnormal glucose: Secondary | ICD-10-CM | POA: Diagnosis not present

## 2015-01-16 DIAGNOSIS — E559 Vitamin D deficiency, unspecified: Secondary | ICD-10-CM | POA: Diagnosis not present

## 2015-01-16 DIAGNOSIS — L659 Nonscarring hair loss, unspecified: Secondary | ICD-10-CM

## 2015-01-16 DIAGNOSIS — K602 Anal fissure, unspecified: Secondary | ICD-10-CM

## 2015-01-16 DIAGNOSIS — M722 Plantar fascial fibromatosis: Secondary | ICD-10-CM

## 2015-01-16 DIAGNOSIS — R160 Hepatomegaly, not elsewhere classified: Secondary | ICD-10-CM

## 2015-01-16 DIAGNOSIS — S0300XD Dislocation of jaw, unspecified side, subsequent encounter: Secondary | ICD-10-CM

## 2015-01-16 DIAGNOSIS — Z9181 History of falling: Secondary | ICD-10-CM

## 2015-01-16 DIAGNOSIS — M542 Cervicalgia: Secondary | ICD-10-CM

## 2015-01-16 DIAGNOSIS — R6889 Other general symptoms and signs: Secondary | ICD-10-CM

## 2015-01-16 DIAGNOSIS — E669 Obesity, unspecified: Secondary | ICD-10-CM

## 2015-01-16 DIAGNOSIS — F17200 Nicotine dependence, unspecified, uncomplicated: Secondary | ICD-10-CM

## 2015-01-16 DIAGNOSIS — S0300XA Dislocation of jaw, unspecified side, initial encounter: Secondary | ICD-10-CM | POA: Insufficient documentation

## 2015-01-16 DIAGNOSIS — E782 Mixed hyperlipidemia: Secondary | ICD-10-CM | POA: Diagnosis not present

## 2015-01-16 DIAGNOSIS — D649 Anemia, unspecified: Secondary | ICD-10-CM

## 2015-01-16 DIAGNOSIS — Z720 Tobacco use: Secondary | ICD-10-CM | POA: Insufficient documentation

## 2015-01-16 HISTORY — DX: Prediabetes: R73.03

## 2015-01-16 LAB — CBC WITH DIFFERENTIAL/PLATELET
Basophils Absolute: 0.1 10*3/uL (ref 0.0–0.1)
Basophils Relative: 1 % (ref 0–1)
EOS ABS: 0.8 10*3/uL — AB (ref 0.0–0.7)
Eosinophils Relative: 9 % — ABNORMAL HIGH (ref 0–5)
HCT: 40 % (ref 36.0–46.0)
Hemoglobin: 13.4 g/dL (ref 12.0–15.0)
Lymphocytes Relative: 20 % (ref 12–46)
Lymphs Abs: 1.7 10*3/uL (ref 0.7–4.0)
MCH: 30.2 pg (ref 26.0–34.0)
MCHC: 33.5 g/dL (ref 30.0–36.0)
MCV: 90.1 fL (ref 78.0–100.0)
MONOS PCT: 5 % (ref 3–12)
MPV: 10.6 fL (ref 8.6–12.4)
Monocytes Absolute: 0.4 10*3/uL (ref 0.1–1.0)
NEUTROS PCT: 65 % (ref 43–77)
Neutro Abs: 5.6 10*3/uL (ref 1.7–7.7)
PLATELETS: 330 10*3/uL (ref 150–400)
RBC: 4.44 MIL/uL (ref 3.87–5.11)
RDW: 14.3 % (ref 11.5–15.5)
WBC: 8.6 10*3/uL (ref 4.0–10.5)

## 2015-01-16 LAB — HEMOGLOBIN A1C
HEMOGLOBIN A1C: 5.5 % (ref <5.7)
MEAN PLASMA GLUCOSE: 111 mg/dL (ref <117)

## 2015-01-16 MED ORDER — BACLOFEN 10 MG PO TABS: 10.0000 mg | ORAL_TABLET | Freq: Every day | ORAL | Status: DC

## 2015-01-16 NOTE — Progress Notes (Signed)
MEDICARE ANNUAL WELLNESS VISIT AND CPE  Assessment:   1. Hypothyroidism, unspecified hypothyroidism type Hypothyroidism-check TSH level, continue medications the same, reminded to take on an empty stomach 30-81mins before food.  - TSH  2. Hepatomegaly Weight loss advised, no alcohol - Hepatic function panel  3. Mixed hyperlipidemia -continue medications, check lipids, decrease fatty foods, increase activity. - CBC with Differential/Platelet - BASIC METABOLIC PANEL WITH GFR - Lipid panel - EKG 12-Lead  4. Depression + screening, follow up psych, continue meds  5. Obesity Obesity with co morbidities- long discussion about weight loss, diet, and exercise  6. Panic disorder Follow up psych, continue meds  7. Vitamin D deficiency - Magnesium - Vit D  25 hydroxy (rtn osteoporosis monitoring)  8. Anal fissure Controlled, increase water/fiber  9. Tobacco use disorder Smoking cessation-  instruction/counseling given, counseled patient on the dangers of tobacco use, advised patient to stop smoking, and reviewed strategies to maximize success, patient not ready to quit at this time.  - DG Chest 2 View; Future  10. Prediabetes Discussed general issues about diabetes pathophysiology and management., Educational material distributed., Suggested low cholesterol diet., Encouraged aerobic exercise., Discussed foot care., Reminded to get yearly retinal exam. - Hemoglobin A1c - Insulin, fasting - Urinalysis, Routine w reflex microscopic - Microalbumin / creatinine urine ratio - HM DIABETES FOOT EXAM  11. TMJ (dislocation of temporomandibular joint), subsequent encounter information given to the patient, no gum/decrease hard foods, warm wet wash clothes, decrease stress, talk with dentist about possible night guard, can do massage, and exercise.  - Ambulatory referral to Physical Therapy  12. Plantar fasciitis Follow up ortho  13. Neck pain - Ambulatory referral to Physical  Therapy  14. Hair loss Check labs, ? stress  15. Anemia, unspecified anemia type - Iron and TIBC - Ferritin - Folate RBC - Vitamin B12  Over 40 minutes of exam, counseling, chart review and critical decision making was performed  Plan:   During the course of the visit the patient was educated and counseled about appropriate screening and preventive services including:    Pneumococcal vaccine   Influenza vaccine  Td vaccine  Screening electrocardiogram  Bone densitometry screening  Colorectal cancer screening  Diabetes screening  Glaucoma screening  Nutrition counseling   Advanced directives: requested  Conditions/risks identified: BMI: Discussed weight loss, diet, and increase physical activity.  Increase physical activity: AHA recommends 150 minutes of physical activity a week.  Medications reviewed Urinary Incontinence is not an issue: discussed non pharmacology and pharmacology options.  Fall risk: low- discussed PT, home fall assessment, medications.    Subjective:  Susan Bartlett is a 49 y.o. female who presents for Medicare Annual Wellness Visit and complete physical.  Date of last medicare wellness visit is unknown.   Her blood pressure has been controlled at home, today their BP is BP: 132/72 mmHg She does not workout. She denies chest pain, shortness of breath, dizziness.  She is on cholesterol medication, pravastatin 40 and denies myalgias. Her cholesterol is not at goal. The cholesterol last visit was:   Lab Results  Component Value Date   CHOL 182 12/13/2013   HDL 40 12/13/2013   LDLCALC 109* 12/13/2013   TRIG 166* 12/13/2013   CHOLHDL 4.6 12/13/2013  Last A1C in the office was:  Lab Results  Component Value Date   HGBA1C 5.6 12/13/2013  Patient is on Vitamin D supplement.   Lab Results  Component Value Date   VD25OH 35 12/13/2013  She has  been losing her hair for a 1 month. She has been under a lot of stress with her mother, she is primary  care giver.  She has had headaches since 50 years old, but they have been worse the last 1-2 months, frontal HA's.  Wears night guard for TMJ.  She is on thyroid medication. Her medication was not changed last visit.   Lab Results  Component Value Date   TSH 2.678 08/10/2014  She is on gabapentin for RLS.  She is on klonopin for panic attacks/anxiety, and she is on zoloft for depression, follows with Dr. Richardean Sale.   She had a stress fracture knee right knee, and plantar faciitis and follows with ortho, [place]. She continues to have plantar faciitis, shoulder pain.   She has some SOB with exertion, unchanged, continues to smoke, is interested in quitting.   Medication Review: Current Outpatient Prescriptions on File Prior to Visit  Medication Sig Dispense Refill  . clonazePAM (KLONOPIN) 1 MG tablet Take 2 mg by mouth 3 (three) times daily. Take 1/2 by mouth every am, 1/2 at lunch, and 1 every night    . gabapentin (NEURONTIN) 100 MG capsule Take 200 mg by mouth 2 (two) times daily as needed.     Marland Kitchen ibuprofen (ADVIL,MOTRIN) 200 MG tablet Take 200 mg by mouth every 6 (six) hours as needed.    Marland Kitchen levothyroxine (SYNTHROID, LEVOTHROID) 50 MCG tablet Take 1 tablet (50 mcg total) by mouth daily. 30 tablet 2  . pravastatin (PRAVACHOL) 40 MG tablet TAKE ONE TABLET BY MOUTH ONCE DAILY 90 tablet 0  . sertraline (ZOLOFT) 100 MG tablet Take 200 mg by mouth 2 (two) times daily.     . Vitamin D, Ergocalciferol, (DRISDOL) 50000 UNITS CAPS capsule Take 1 capsule (50,000 Units total) by mouth every 7 (seven) days. 4 capsule 13   No current facility-administered medications on file prior to visit.    Current Problems (verified) Patient Active Problem List   Diagnosis Date Noted  . Tobacco use disorder 01/16/2015  . Prediabetes 01/16/2015  . TMJ (dislocation of temporomandibular joint) 01/16/2015  . Vitamin D deficiency   . Hepatomegaly   . Mixed hyperlipidemia   . Panic disorder   . Anal fissure    . Depression   . Hypothyroidism   . Obesity     Screening Tests Immunization History  Administered Date(s) Administered  . Influenza Split 08/01/2013  . Td 11/18/2012   Preventative care: Tetanus: 11/2012 Pneumovax: N/A Prevnar 13: N/A Flu vaccine: 07/2014 Zostavax: N/A  Pap:  03/2013- Dr. Hyacinth Meeker, history of ASCUS MGM: May/ 2015 [place] June 2015 on Right breast, will get another DEXA: 2008, heel, neg Colonoscopy: 11/2014 due 10 years EGD: N/A PFT: 01/2013 Stress test: 12/2012 [facility]) Echo: 12/2012  Names of Other Physician/Practitioners you currently use: 1. [facility] here for primary care 2.  [facility], eye doctor, last visit 1999 3. Dr. Lequita Asal , dentist, last visit 6 years Patient Care Team: Lucky Cowboy, MD as PCP - General (Internal Medicine) Wendall Stade, MD as Consulting Physician (Cardiology) Iva Boop, MD as Consulting Physician (Gastroenterology) Jerene Bears, MD as Consulting Physician (Gynecology)  SURGICAL HISTORY Past Surgical History  Procedure Laterality Date  . Colonoscopy  03/25/2007    small external hemorroids  . Hemorroidectomy    . Cholecystectomy    . Left oophorectomy    . Tonsillectomy     FAMILY HISTORY Family History  Problem Relation  Age of Onset  . Colon cancer Maternal Grandmother 50  . Breast cancer Mother   . Diabetes Mother   . Drug abuse Brother     and alcohol  . Cirrhosis Father     alcohol  . Heart disease Father     MI  . Alcohol abuse Sister   . Thyroid disease Neg Hx    SOCIAL HISTORY History  Substance Use Topics  . Smoking status: Current Every Day Smoker -- 1.00 packs/day    Types: Cigarettes  . Smokeless tobacco: Never Used  . Alcohol Use: 0.0 oz/week    0 Standard drinks or equivalent per week     Comment: 2 times yearly at the most    MEDICARE WELLNESS OBJECTIVES: Tobacco use: She does smoke.   Alcohol Current alcohol use:  none Caffeine Current caffeine use: coffee 1 /day Osteoporosis: postmenopausal estrogen deficiency and dietary calcium and/or vitamin D deficiency, History of fracture in the past year: no Diet: in general, an "unhealthy" diet Physical activity: none right now, wants to start Depression/mood screen:   [facility] 01/16/2015  Decreased Interest 1  Down, Depressed, Hopeless 1  PHQ - 2 Score 2  Altered sleeping 1  Tired, decreased energy 1  Change in appetite 1  Feeling bad or failure about yourself  1  Trouble concentrating 1  Moving slowly or fidgety/restless 0  Suicidal thoughts 0  PHQ-9 Score 7  Difficult doing work/chores Not difficult at all   Hearing: normal Visual acuity: normal,  does not perform annual eye exam  ADLs:  In your present state of health, do you have any difficulty performing the following activities: 01/16/2015  Is the patient deaf or have difficulty hearing? N  Hearing N  Vision N  Difficulty concentrating or making decisions N  Walking or climbing stairs? N  Doing errands, shopping? N  Preparing Food and eating ? N  Using the Toilet? N  In the past six months, have you accidently leaked urine? N  Do you have problems with loss of bowel control? N  Managing your Medications? N  Managing your Finances? N  Housekeeping or managing your Housekeeping? N    Fall risk: Low Risk Cognitive Testing  Alert? Yes  Normal Appearance?Yes  Oriented to person? Yes  Place? Yes   Time? Yes  Recall of three objects?  Yes  Can perform simple calculations? Yes  Displays appropriate judgment?Yes  Can read the correct time from a watch face?Yes EOL planning: Does patient have an advance directive?: No Would patient like information on creating an advanced directive?: Yes - Educational materials given     Objective:     Blood pressure 132/72, pulse 80, temperature 97.7 F (36.5 C), resp. rate 16, height 5' 7.5" (1.715 m), weight 208 lb (94.348 kg), last  menstrual period 08/04/2012. Body mass index is 32.08 kg/(m^2).  General appearance: alert, no distress, WD/WN, female HEENT: normocephalic, sclerae anicteric, TMs pearly, nares patent, no discharge or erythema, pharynx normal, + TMJ tenderness bilateral.  Oral cavity: MMM, no lesions Neck: supple, no lymphadenopathy, no thyromegaly, no masses Heart: RRR, normal S1, S2, no murmurs Lungs: CTA bilaterally, no wheezes, rhonchi, or rales Abdomen: +bs, soft, non tender, non distended, no masses, no hepatomegaly, no splenomegaly Musculoskeletal: nontender, no swelling, no obvious deformity Extremities: no edema, no cyanosis, no clubbing Pulses: 2+ symmetric, upper and lower extremities, normal cap refill Neurological: alert, oriented x 3, CN2-12 intact, strength normal upper extremities and lower extremities, sensation  normal throughout, DTRs 2+ throughout, no cerebellar signs, antalgic gait Psychiatric: normal affect, behavior normal, pleasant   Medicare Attestation I have personally reviewed: The patient's medical and social history Their use of alcohol, tobacco or illicit drugs Their current medications and supplements The patient's functional ability including ADLs,fall risks, home safety risks, cognitive, and hearing and visual impairment Diet and physical activities Evidence for depression or mood disorders  The patient's weight, height, BMI, and visual acuity have been recorded in the chart.  I have made referrals, counseling, and provided education to the patient based on review of the above and I have provided the patient with a written personalized care plan for preventive services.     Quentin Mulling, PA-C   01/16/2015

## 2015-01-16 NOTE — Patient Instructions (Signed)
Preventive Care for Adults A healthy lifestyle and preventive care can promote health and wellness. Preventive health guidelines for women include the following key practices.  A routine yearly physical is a good way to check with your health care provider about your health and preventive screening. It is a chance to share any concerns and updates on your health and to receive a thorough exam.  Visit your dentist for a routine exam and preventive care every 6 months. Brush your teeth twice a day and floss once a day. Good oral hygiene prevents tooth decay and gum disease.  The frequency of eye exams is based on your age, health, family medical history, use of contact lenses, and other factors. Follow your health care provider's recommendations for frequency of eye exams.  Eat a healthy diet. Foods like vegetables, fruits, whole grains, low-fat dairy products, and lean protein foods contain the nutrients you need without too many calories. Decrease your intake of foods high in solid fats, added sugars, and salt. Eat the right amount of calories for you.Get information about a proper diet from your health care provider, if necessary.  Regular physical exercise is one of the most important things you can do for your health. Most adults should get at least 150 minutes of moderate-intensity exercise (any activity that increases your heart rate and causes you to sweat) each week. In addition, most adults need muscle-strengthening exercises on 2 or more days a week.  Maintain a healthy weight. The body mass index (BMI) is a screening tool to identify possible weight problems. It provides an estimate of body fat based on height and weight. Your health care provider can find your BMI and can help you achieve or maintain a healthy weight.For adults 20 years and older:  A BMI below 18.5 is considered underweight.  A BMI of 18.5 to 24.9 is normal.  A BMI of 25 to 29.9 is considered overweight.  A BMI of  30 and above is considered obese.  Maintain normal blood lipids and cholesterol levels by exercising and minimizing your intake of saturated fat. Eat a balanced diet with plenty of fruit and vegetables. If your lipid or cholesterol levels are high, you are over 50, or you are at high risk for heart disease, you may need your cholesterol levels checked more frequently.Ongoing high lipid and cholesterol levels should be treated with medicines if diet and exercise are not working.  If you smoke, find out from your health care provider how to quit. If you do not use tobacco, do not start.  Lung cancer screening is recommended for adults aged 86-80 years who are at high risk for developing lung cancer because of a history of smoking. A yearly low-dose CT scan of the lungs is recommended for people who have at least a 30-pack-year history of smoking and are a current smoker or have quit within the past 15 years. A pack year of smoking is smoking an average of 1 pack of cigarettes a day for 1 year (for example: 1 pack a day for 30 years or 2 packs a day for 15 years). Yearly screening should continue until the smoker has stopped smoking for at least 15 years. Yearly screening should be stopped for people who develop a health problem that would prevent them from having lung cancer treatment.  Avoid use of street drugs. Do not share needles with anyone. Ask for help if you need support or instructions about stopping the use of drugs.  High blood  pressure causes heart disease and increases the risk of stroke.  Ongoing high blood pressure should be treated with medicines if weight loss and exercise do not work.  If you are 55-79 years old, ask your health care provider if you should take aspirin to prevent strokes.  Diabetes screening involves taking a blood sample to check your fasting blood sugar level. This should be done once every 3 years, after age 45, if you are within normal weight and without risk  factors for diabetes. Testing should be considered at a younger age or be carried out more frequently if you are overweight and have at least 1 risk factor for diabetes.  Breast cancer screening is essential preventive care for women. You should practice "breast self-awareness." This means understanding the normal appearance and feel of your breasts and may include breast self-examination. Any changes detected, no matter how small, should be reported to a health care provider. Women in their 20s and 30s should have a clinical breast exam (CBE) by a health care provider as part of a regular health exam every 1 to 3 years. After age 40, women should have a CBE every year. Starting at age 40, women should consider having a mammogram (breast X-ray test) every year. Women who have a family history of breast cancer should talk to their health care provider about genetic screening. Women at a high risk of breast cancer should talk to their health care providers about having an MRI and a mammogram every year.  Breast cancer gene (BRCA)-related cancer risk assessment is recommended for women who have family members with BRCA-related cancers. BRCA-related cancers include breast, ovarian, tubal, and peritoneal cancers. Having family members with these cancers may be associated with an increased risk for harmful changes (mutations) in the breast cancer genes BRCA1 and BRCA2. Results of the assessment will determine the need for genetic counseling and BRCA1 and BRCA2 testing.  Routine pelvic exams to screen for cancer are no longer recommended for nonpregnant women who are considered low risk for cancer of the pelvic organs (ovaries, uterus, and vagina) and who do not have symptoms. Ask your health care provider if a screening pelvic exam is right for you.  If you have had past treatment for cervical cancer or a condition that could lead to cancer, you need Pap tests and screening for cancer for at least 20 years after  your treatment. If Pap tests have been discontinued, your risk factors (such as having a new sexual partner) need to be reassessed to determine if screening should be resumed. Some women have medical problems that increase the chance of getting cervical cancer. In these cases, your health care provider may recommend more frequent screening and Pap tests.    Colorectal cancer can be detected and often prevented. Most routine colorectal cancer screening begins at the age of 50 years and continues through age 75 years. However, your health care provider may recommend screening at an earlier age if you have risk factors for colon cancer. On a yearly basis, your health care provider may provide home test kits to check for hidden blood in the stool. Use of a small camera at the end of a tube, to directly examine the colon (sigmoidoscopy or colonoscopy), can detect the earliest forms of colorectal cancer. Talk to your health care provider about this at age 50, when routine screening begins. Direct exam of the colon should be repeated every 5-10 years through age 75 years, unless early forms of pre-cancerous polyps   or small growths are found.  Osteoporosis is a disease in which the bones lose minerals and strength with aging. This can result in serious bone fractures or breaks. The risk of osteoporosis can be identified using a bone density scan. Women ages 62 years and over and women at risk for fractures or osteoporosis should discuss screening with their health care providers. Ask your health care provider whether you should take a calcium supplement or vitamin D to reduce the rate of osteoporosis.  Menopause can be associated with physical symptoms and risks. Hormone replacement therapy is available to decrease symptoms and risks. You should talk to your health care provider about whether hormone replacement therapy is right for you.  Use sunscreen. Apply sunscreen liberally and repeatedly throughout the day.  You should seek shade when your shadow is shorter than you. Protect yourself by wearing long sleeves, pants, a wide-brimmed hat, and sunglasses year round, whenever you are outdoors.  Once a month, do a whole body skin exam, using a mirror to look at the skin on your back. Tell your health care provider of new moles, moles that have irregular borders, moles that are larger than a pencil eraser, or moles that have changed in shape or color.  Stay current with required vaccines (immunizations).  Influenza vaccine. All adults should be immunized every year.  Tetanus, diphtheria, and acellular pertussis (Td, Tdap) vaccine. Pregnant women should receive 1 dose of Tdap vaccine during each pregnancy. The dose should be obtained regardless of the length of time since the last dose. Immunization is preferred during the 27th-36th week of gestation. An adult who has not previously received Tdap or who does not know her vaccine status should receive 1 dose of Tdap. This initial dose should be followed by tetanus and diphtheria toxoids (Td) booster doses every 10 years. Adults with an unknown or incomplete history of completing a 3-dose immunization series with Td-containing vaccines should begin or complete a primary immunization series including a Tdap dose. Adults should receive a Td booster every 10 years.    Zoster vaccine. One dose is recommended for adults aged 4 years or older unless certain conditions are present.    Pneumococcal 13-valent conjugate (PCV13) vaccine. When indicated, a person who is uncertain of her immunization history and has no record of immunization should receive the PCV13 vaccine. An adult aged 35 years or older who has certain medical conditions and has not been previously immunized should receive 1 dose of PCV13 vaccine. This PCV13 should be followed with a dose of pneumococcal polysaccharide (PPSV23) vaccine. The PPSV23 vaccine dose should be obtained at least 8 weeks after the  dose of PCV13 vaccine. An adult aged 85 years or older who has certain medical conditions and previously received 1 or more doses of PPSV23 vaccine should receive 1 dose of PCV13. The PCV13 vaccine dose should be obtained 1 or more years after the last PPSV23 vaccine dose.    Pneumococcal polysaccharide (PPSV23) vaccine. When PCV13 is also indicated, PCV13 should be obtained first. All adults aged 15 years and older should be immunized. An adult younger than age 38 years who has certain medical conditions should be immunized. Any person who resides in a nursing home or long-term care facility should be immunized. An adult smoker should be immunized. People with an immunocompromised condition and certain other conditions should receive both PCV13 and PPSV23 vaccines. People with human immunodeficiency virus (HIV) infection should be immunized as soon as possible after diagnosis. Immunization during  chemotherapy or radiation therapy should be avoided. Routine use of PPSV23 vaccine is not recommended for American Indians, [place]atives, or people younger than 65 years unless there are medical conditions that require PPSV23 vaccine. When indicated, people who have unknown immunization and have no record of immunization should receive PPSV23 vaccine. One-time revaccination 5 years after the first dose of PPSV23 is recommended for people aged 19-64 years who have chronic kidney failure, nephrotic syndrome, asplenia, or immunocompromised conditions. People who received 1-2 doses of PPSV23 before age 63 years should receive another dose of PPSV23 vaccine at age 36 years or later if at least 5 years have passed since the previous dose. Doses of PPSV23 are not needed for people immunized with PPSV23 at or after age 88 years.   Preventive Services / Frequency  Ages 60 years and over  Blood pressure check.  Lipid and cholesterol check.  Lung cancer screening. / Every year if you are aged 70-80 years and have a  30-pack-year history of smoking and currently smoke or have quit within the past 15 years. Yearly screening is stopped once you have quit smoking for at least 15 years or develop a health problem that would prevent you from having lung cancer treatment.  Clinical breast exam.** / Every year after age 83 years.  BRCA-related cancer risk assessment.** / For women who have family members with a BRCA-related cancer (breast, ovarian, tubal, or peritoneal cancers).  Mammogram.** / Every year beginning at age 39 years and continuing for as long as you are in good health. Consult with your health care provider.  Pap test.** / Every 3 years starting at age 93 years through age 51 or 67 years with 3 consecutive normal Pap tests. Testing can be stopped between 65 and 70 years with 3 consecutive normal Pap tests and no abnormal Pap or HPV tests in the past 10 years.  Fecal occult blood test (FOBT) of stool. / Every year beginning at age 88 years and continuing until age 69 years. You may not need to do this test if you get a colonoscopy every 10 years.  Flexible sigmoidoscopy or colonoscopy.** / Every 5 years for a flexible sigmoidoscopy or every 10 years for a colonoscopy beginning at age 43 years and continuing until age 9 years.  Hepatitis C blood test.** / For all people born from [birth year] through 1966 and any individual with known risks for hepatitis C.  Osteoporosis screening.** / A one-time screening for women ages 48 years and over and women at risk for fractures or osteoporosis.  Skin self-exam. / Monthly.  Influenza vaccine. / Every year.  Tetanus, diphtheria, and acellular pertussis (Tdap/Td) vaccine.** / 1 dose of Td every 10 years.  Zoster vaccine.** / 1 dose for adults aged 31 years or older.  Pneumococcal 13-valent conjugate (PCV13) vaccine.** / Consult your health care provider.  Pneumococcal polysaccharide (PPSV23) vaccine.** / 1 dose for all adults aged 73 years and older. Screening  for abdominal aortic aneurysm (AAA)  by ultrasound is recommended for people who have history of high blood pressure or who are current or former smokers.   What is the TMJ? The temporomandibular (tem-PUH-ro-man-DIB-yoo-ler) joint, or the TMJ, connects the upper and lower jawbones. This joint allows the jaw to open wide and move back and forth when you chew, talk, or yawn.There are also several muscles that help this joint move. There can be muscle tightness and pain in the muscle that can cause several symptoms.  What causes TMJ  pain? There are many causes of TMJ pain. Repeated chewing (for example, chewing gum) and clenching your teeth can cause pain in the joint. Some TMJ pain has no obvious cause. What can I do to ease the pain? There are many things you can do to help your pain get better. When you have pain:  Eat soft foods and stay away from chewy foods (for example, taffy) Try to use both sides of your mouth to chew Don't chew gum Massage Don't open your mouth wide (for example, during yawning or singing) Don't bite your cheeks or fingernails Lower your amount of stress and worry Applying a warm, damp washcloth to the joint may help. Over-the-counter pain medicines such as ibuprofen (one brand: Advil) or acetaminophen (one brand: Tylenol) might also help. Do not use these medicines if you are allergic to them or if your doctor told you not to use them. How can I stop the pain from coming back? When your pain is better, you can do these exercises to make your muscles stronger and to keep the pain from coming back:  Resisted mouth opening: Place your thumb or two fingers under your chin and open your mouth slowly, pushing up lightly on your chin with your thumb. Hold for three to six seconds. Close your mouth slowly. Resisted mouth closing: Place your thumbs under your chin and your two index fingers on the ridge between your mouth and the bottom of your chin. Push down lightly on your  chin as you close your mouth. Tongue up: Slowly open and close your mouth while keeping the tongue touching the roof of the mouth. Side-to-side jaw movement: Place an object about one fourth of an inch thick (for example, two tongue depressors) between your front teeth. Slowly move your jaw from side to side. Increase the thickness of the object as the exercise becomes easier Forward jaw movement: Place an object about one fourth of an inch thick between your front teeth and move the bottom jaw forward so that the bottom teeth are in front of the top teeth. Increase the thickness of the object as the exercise becomes easier. These exercises should not be painful. If it hurts to do these exercises, stop doing them and talk to your family doctor.

## 2015-01-17 ENCOUNTER — Encounter: Payer: Self-pay | Admitting: Internal Medicine

## 2015-01-17 ENCOUNTER — Encounter: Payer: Self-pay | Admitting: Physician Assistant

## 2015-01-17 LAB — LIPID PANEL
Cholesterol: 207 mg/dL — ABNORMAL HIGH (ref 0–200)
HDL: 39 mg/dL — ABNORMAL LOW (ref >=46)
LDL CALC: 99 mg/dL (ref 0–99)
TRIGLYCERIDES: 345 mg/dL — AB (ref <150)
Total CHOL/HDL Ratio: 5.3 Ratio
VLDL: 69 mg/dL — ABNORMAL HIGH (ref 0–40)

## 2015-01-17 LAB — BASIC METABOLIC PANEL WITH GFR
BUN: 10 mg/dL (ref 6–23)
CO2: 26 mEq/L (ref 19–32)
Calcium: 9.3 mg/dL (ref 8.4–10.5)
Chloride: 102 mEq/L (ref 96–112)
Creat: 0.65 mg/dL (ref 0.50–1.10)
Glucose, Bld: 81 mg/dL (ref 70–99)
Potassium: 4.4 mEq/L (ref 3.5–5.3)
Sodium: 138 mEq/L (ref 135–145)

## 2015-01-17 LAB — MAGNESIUM: Magnesium: 1.7 mg/dL (ref 1.5–2.5)

## 2015-01-17 LAB — IRON AND TIBC
%SAT: 18 % — ABNORMAL LOW (ref 20–55)
Iron: 66 ug/dL (ref 42–145)
TIBC: 374 ug/dL (ref 250–470)
UIBC: 308 ug/dL (ref 125–400)

## 2015-01-17 LAB — HEPATIC FUNCTION PANEL
ALBUMIN: 4.2 g/dL (ref 3.5–5.2)
ALT: 19 U/L (ref 0–35)
AST: 18 U/L (ref 0–37)
Alkaline Phosphatase: 100 U/L (ref 39–117)
Bilirubin, Direct: <0.1 mg/dL (ref 0.0–0.3)
TOTAL PROTEIN: 6.6 g/dL (ref 6.0–8.3)
Total Bilirubin: 0.3 mg/dL (ref 0.2–1.2)

## 2015-01-17 LAB — URINALYSIS, ROUTINE W REFLEX MICROSCOPIC
BILIRUBIN URINE: NEGATIVE
Glucose, UA: NEGATIVE mg/dL
Hgb urine dipstick: NEGATIVE
Ketones, ur: NEGATIVE mg/dL
LEUKOCYTES UA: NEGATIVE
Nitrite: NEGATIVE
PROTEIN: NEGATIVE mg/dL
Specific Gravity, Urine: <1.005 — ABNORMAL LOW (ref 1.005–1.030)
UROBILINOGEN UA: 0.2 mg/dL (ref 0.0–1.0)
pH: 6 (ref 5.0–8.0)

## 2015-01-17 LAB — INSULIN, FASTING: Insulin fasting, serum: 19 u[IU]/mL (ref 2.0–19.6)

## 2015-01-17 LAB — TSH: TSH: 2.632 u[IU]/mL (ref 0.350–4.500)

## 2015-01-17 LAB — VITAMIN D 25 HYDROXY (VIT D DEFICIENCY, FRACTURES): VIT D 25 HYDROXY: 5 ng/mL — AB (ref 30–100)

## 2015-01-17 LAB — MICROALBUMIN / CREATININE URINE RATIO: Creatinine, Urine: 13.2 mg/dL

## 2015-01-17 LAB — VITAMIN B12: Vitamin B-12: 310 pg/mL (ref 211–911)

## 2015-01-17 LAB — FOLATE RBC: RBC FOLATE: 709 ng/mL (ref >280)

## 2015-01-17 LAB — FERRITIN: FERRITIN: 35 ng/mL (ref 10–291)

## 2015-01-19 ENCOUNTER — Encounter: Payer: Self-pay | Admitting: Physician Assistant

## 2015-01-20 ENCOUNTER — Other Ambulatory Visit: Payer: Self-pay | Admitting: Internal Medicine

## 2015-01-20 DIAGNOSIS — E559 Vitamin D deficiency, unspecified: Secondary | ICD-10-CM

## 2015-01-20 MED ORDER — VITAMIN D (ERGOCALCIFEROL) 1.25 MG (50000 UNIT) PO CAPS: ORAL_CAPSULE | ORAL | Status: AC

## 2015-01-22 ENCOUNTER — Encounter: Payer: Self-pay | Admitting: Physician Assistant

## 2015-01-22 ENCOUNTER — Encounter: Payer: Self-pay | Admitting: Internal Medicine

## 2015-01-29 ENCOUNTER — Encounter: Payer: Self-pay | Admitting: Physician Assistant

## 2015-01-30 DIAGNOSIS — M542 Cervicalgia: Secondary | ICD-10-CM | POA: Diagnosis not present

## 2015-02-02 DIAGNOSIS — M542 Cervicalgia: Secondary | ICD-10-CM | POA: Diagnosis not present

## 2015-02-09 DIAGNOSIS — M542 Cervicalgia: Secondary | ICD-10-CM | POA: Diagnosis not present

## 2015-03-16 ENCOUNTER — Other Ambulatory Visit: Payer: Self-pay | Admitting: *Deleted

## 2015-03-16 MED ORDER — LEVOTHYROXINE SODIUM 50 MCG PO TABS: 50.0000 ug | ORAL_TABLET | Freq: Every day | ORAL | Status: DC

## 2015-03-16 NOTE — Telephone Encounter (Signed)
Medication refill request: Synthroid 50 mcg Last AEX:  06/28/14 SM Next AEX: ? Last MMG (if hormonal medication request): 03/13/14 [id number]:Probably Benign  Refill authorized: 11/14/14 #[id number] w/2R.  [name], CMA at 11/07/2014 4:42 PM     Status: Signed       Expand All Collapse All   Called patient to clarified if she is going to be seeing Dr. Everardo AllEllison for her condition. Patient states she wants to keep seeing Dr. Hyacinth MeekerMiller, patient states she trusts Dr. Hyacinth MeekerMiller more than PCP. Patient has not future appts but willing to come if Dr. Hyacinth MeekerMiller needs to check her TSH again.        Patient had TSH check with PCP 01/2015.   Please advise.

## 2015-04-24 ENCOUNTER — Ambulatory Visit: Payer: Self-pay | Admitting: Physician Assistant

## 2015-04-26 ENCOUNTER — Other Ambulatory Visit: Payer: Self-pay

## 2015-04-26 MED ORDER — PRAVASTATIN SODIUM 40 MG PO TABS: 40.0000 mg | ORAL_TABLET | Freq: Every day | ORAL | Status: DC

## 2015-04-30 ENCOUNTER — Other Ambulatory Visit: Payer: Self-pay | Admitting: *Deleted

## 2015-04-30 MED ORDER — PRAVASTATIN SODIUM 40 MG PO TABS: 40.0000 mg | ORAL_TABLET | Freq: Every day | ORAL | Status: DC

## 2015-05-08 DIAGNOSIS — M722 Plantar fascial fibromatosis: Secondary | ICD-10-CM | POA: Diagnosis not present

## 2015-05-18 ENCOUNTER — Encounter: Payer: Self-pay | Admitting: Physician Assistant

## 2015-05-18 ENCOUNTER — Ambulatory Visit (INDEPENDENT_AMBULATORY_CARE_PROVIDER_SITE_OTHER): Payer: Medicare Other | Admitting: Physician Assistant

## 2015-05-18 VITALS — BP 146/86 | HR 80 | Temp 98.5°F | Resp 16 | Ht 67.5 in | Wt 206.0 lb

## 2015-05-18 DIAGNOSIS — E039 Hypothyroidism, unspecified: Secondary | ICD-10-CM | POA: Diagnosis not present

## 2015-05-18 DIAGNOSIS — E559 Vitamin D deficiency, unspecified: Secondary | ICD-10-CM | POA: Diagnosis not present

## 2015-05-18 DIAGNOSIS — E669 Obesity, unspecified: Secondary | ICD-10-CM | POA: Diagnosis not present

## 2015-05-18 DIAGNOSIS — L659 Nonscarring hair loss, unspecified: Secondary | ICD-10-CM

## 2015-05-18 DIAGNOSIS — E782 Mixed hyperlipidemia: Secondary | ICD-10-CM | POA: Diagnosis not present

## 2015-05-18 DIAGNOSIS — Z720 Tobacco use: Secondary | ICD-10-CM | POA: Diagnosis not present

## 2015-05-18 DIAGNOSIS — R160 Hepatomegaly, not elsewhere classified: Secondary | ICD-10-CM

## 2015-05-18 DIAGNOSIS — F17200 Nicotine dependence, unspecified, uncomplicated: Secondary | ICD-10-CM

## 2015-05-18 LAB — CBC WITH DIFFERENTIAL/PLATELET
BASOS PCT: 1 % (ref 0–1)
Basophils Absolute: 0.1 10*3/uL (ref 0.0–0.1)
EOS PCT: 10 % — AB (ref 0–5)
Eosinophils Absolute: 0.6 10*3/uL (ref 0.0–0.7)
HCT: 39.9 % (ref 36.0–46.0)
HEMOGLOBIN: 14 g/dL (ref 12.0–15.0)
LYMPHS PCT: 32 % (ref 12–46)
Lymphs Abs: 2 10*3/uL (ref 0.7–4.0)
MCH: 31.2 pg (ref 26.0–34.0)
MCHC: 35.1 g/dL (ref 30.0–36.0)
MCV: 88.9 fL (ref 78.0–100.0)
MONOS PCT: 8 % (ref 3–12)
MPV: 10.1 fL (ref 8.6–12.4)
Monocytes Absolute: 0.5 10*3/uL (ref 0.1–1.0)
NEUTROS ABS: 3.1 10*3/uL (ref 1.7–7.7)
NEUTROS PCT: 49 % (ref 43–77)
PLATELETS: 302 10*3/uL (ref 150–400)
RBC: 4.49 MIL/uL (ref 3.87–5.11)
RDW: 14.7 % (ref 11.5–15.5)
WBC: 6.3 10*3/uL (ref 4.0–10.5)

## 2015-05-18 LAB — LIPID PANEL
CHOLESTEROL: 210 mg/dL — AB (ref 125–200)
HDL: 47 mg/dL (ref >=46)
LDL Cholesterol: 145 mg/dL — ABNORMAL HIGH (ref <130)
TRIGLYCERIDES: 90 mg/dL (ref <150)
Total CHOL/HDL Ratio: 4.5 Ratio (ref <=5.0)
VLDL: 18 mg/dL (ref <30)

## 2015-05-18 LAB — BASIC METABOLIC PANEL WITH GFR
BUN: 6 mg/dL — AB (ref 7–25)
CHLORIDE: 102 mmol/L (ref 98–110)
CO2: 24 mmol/L (ref 20–31)
CREATININE: 0.69 mg/dL (ref 0.50–1.05)
Calcium: 9.9 mg/dL (ref 8.6–10.4)
GFR, Est African American: >89 mL/min (ref >=60)
GLUCOSE: 88 mg/dL (ref 65–99)
POTASSIUM: 4.3 mmol/L (ref 3.5–5.3)
Sodium: 136 mmol/L (ref 135–146)

## 2015-05-18 LAB — HEPATIC FUNCTION PANEL
ALK PHOS: 78 U/L (ref 33–130)
ALT: 12 U/L (ref 6–29)
AST: 14 U/L (ref 10–35)
Albumin: 4.4 g/dL (ref 3.6–5.1)
Bilirubin, Direct: 0.1 mg/dL (ref <=0.2)
Indirect Bilirubin: 0.3 mg/dL (ref 0.2–1.2)
Total Bilirubin: 0.4 mg/dL (ref 0.2–1.2)
Total Protein: 6.8 g/dL (ref 6.1–8.1)

## 2015-05-18 LAB — TSH: TSH: 3.537 u[IU]/mL (ref 0.350–4.500)

## 2015-05-18 LAB — MAGNESIUM: Magnesium: 1.9 mg/dL (ref 1.5–2.5)

## 2015-05-18 NOTE — Patient Instructions (Addendum)
Stay on vitamin d, checking lab and may adjust.  Start on fusion plus one pill a day between breakfast and lunch without food, if you do have stomach discomfort with it take with food but this pill is much gentler than the separate vitamins After 2 weeks if you are tolerating the fusion plus and doing well with it BUT still having AB bloating try the restora WITH FOOD day or night  10 Tips on Belching, Bloating, and Flatulence 1. Belching is caused by swallowed air from:  Eating or drinking too fast  Poorly fitting dentures; not chewing food completely  Carbonated beverages  Chewing gum or sucking on hard candies  Excessive swallowing due to nervous tension or postnasal drip  Forced belching to relieve abdominal discomfort 2. To prevent excessive belching, avoid:  Carbonated beverages  Chewing gum  Hard candies  Simethicone/GasX may be helpful  3. Abdominal bloating and discomfort may be due to intestinal sensitivity or symptoms of irritable bowel syndrome. To relieve symptoms, avoid:  Broccoli  Baked beans  Cabbage  Carbonated drinks  Cauliflower  Chewing gum  Hard candy 4. Abdominal distention resulting from weak abdominal muscles:  Is better in the morning  Gets worse as the day progresses  Is relieved by lying down 5. To prevent Abdominal distention:  Tighten abdominal muscles by pulling in your stomach several times during the day  Do sit-up exercises if possible  Wear an abdominal support garment if exercise is too difficult 6. Flatulence is gas created through bacterial action in the bowel and passed rectally. Keep in mind that:  10-18 passages per day are normal  Primary gases are harmless and odorless  Noticeable smells are trace gases related to food intake 7. Foods that are likely to form gas include:  Milk, dairy products, and medications that contain lactose--If your body doesn't produce the enzyme (lactase) to break it down.  Certain vegetables--baked beans,  cauliflower, broccoli, cabbage  Certain starches--wheat, oats, corn, potatoes. Rice is a good substitute. 8. Identify offending foods. Reduce or eliminate these gas-forming foods from your diet.   Monitor your blood pressure at home, if it is above 140/90 consistently call the office so we can adjust your medications. Go to the ER if any CP, SOB, nausea, dizziness, severe HA, changes vision/speech  DASH Eating Plan DASH stands for "Dietary Approaches to Stop Hypertension." The DASH eating plan is a healthy eating plan that has been shown to reduce high blood pressure (hypertension). Additional health benefits may include reducing the risk of type 2 diabetes mellitus, heart disease, and stroke. The DASH eating plan may also help with weight loss. WHAT DO I NEED TO KNOW ABOUT THE DASH EATING PLAN? For the DASH eating plan, you will follow these general guidelines:  Choose foods with a percent daily value for sodium of less than 5% (as listed on the food label).  Use salt-free seasonings or herbs instead of table salt or sea salt.  Check with your health care provider or pharmacist before using salt substitutes.  Eat lower-sodium products, often labeled as "lower sodium" or "no salt added."  Eat fresh foods.  Eat more vegetables, fruits, and low-fat dairy products.  Choose whole grains. Look for the word "whole" as the first word in the ingredient list.  Choose fish and skinless chicken or [place] more often than red meat. Limit fish, poultry, and meat to 6 oz (170 g) each day.  Limit sweets, desserts, sugars, and sugary drinks.  Choose heart-healthy fats.  Limit cheese to 1 oz (28 g) per day.  Eat more home-cooked food and less restaurant, buffet, and fast food.  Limit fried foods.  Cook foods using methods other than frying.  Limit canned vegetables. If you do use them, rinse them well to decrease the sodium.  When eating at a restaurant, ask that your food be prepared with  less salt, or no salt if possible. WHAT FOODS CAN I EAT? Seek help from a dietitian for individual calorie needs. Grains Whole grain or whole wheat bread. Brown rice. Whole grain or whole wheat pasta. Quinoa, bulgur, and whole grain cereals. Low-sodium cereals. Corn or whole wheat flour tortillas. Whole grain cornbread. Whole grain crackers. Low-sodium crackers. Vegetables Fresh or frozen vegetables (raw, steamed, roasted, or grilled). Low-sodium or reduced-sodium tomato and vegetable juices. Low-sodium or reduced-sodium tomato sauce and paste. Low-sodium or reduced-sodium canned vegetables.  Fruits All fresh, canned (in natural juice), or frozen fruits. Meat and Other Protein Products Ground beef (85% or leaner), grass-fed beef, or beef trimmed of fat. Skinless chicken or [place]. Ground chicken or [place]. Pork trimmed of fat. All fish and seafood. Eggs. Dried beans, peas, or lentils. Unsalted nuts and seeds. Unsalted canned beans. Dairy Low-fat dairy products, such as skim or 1% milk, 2% or reduced-fat cheeses, low-fat ricotta or cottage cheese, or plain low-fat yogurt. Low-sodium or reduced-sodium cheeses. Fats and Oils Tub margarines without trans fats. Light or reduced-fat mayonnaise and salad dressings (reduced sodium). Avocado. Safflower, olive, or canola oils. Natural peanut or almond butter. Other Unsalted popcorn and pretzels. The items listed above may not be a complete list of recommended foods or beverages. Contact your dietitian for more options. WHAT FOODS ARE NOT RECOMMENDED? Grains White bread. White pasta. White rice. Refined cornbread. Bagels and croissants. Crackers that contain trans fat. Vegetables Creamed or fried vegetables. Vegetables in a cheese sauce. Regular canned vegetables. Regular canned tomato sauce and paste. Regular tomato and vegetable juices. Fruits Dried fruits. Canned fruit in light or heavy syrup. Fruit juice. Meat and Other Protein Products Fatty cuts  of meat. Ribs, chicken wings, bacon, sausage, bologna, salami, chitterlings, fatback, hot dogs, bratwurst, and packaged luncheon meats. Salted nuts and seeds. Canned beans with salt. Dairy Whole or 2% milk, cream, half-and-half, and cream cheese. Whole-fat or sweetened yogurt. Full-fat cheeses or blue cheese. Nondairy creamers and whipped toppings. Processed cheese, cheese spreads, or cheese curds. Condiments Onion and garlic salt, seasoned salt, table salt, and sea salt. Canned and packaged gravies. Worcestershire sauce. Tartar sauce. Barbecue sauce. Teriyaki sauce. Soy sauce, including reduced sodium. Steak sauce. Fish sauce. Oyster sauce. Cocktail sauce. Horseradish. Ketchup and mustard. Meat flavorings and tenderizers. Bouillon cubes. Hot sauce. Tabasco sauce. Marinades. Taco seasonings. Relishes. Fats and Oils Butter, stick margarine, lard, shortening, ghee, and bacon fat. Coconut, palm kernel, or palm oils. Regular salad dressings. Other Pickles and olives. Salted popcorn and pretzels. The items listed above may not be a complete list of foods and beverages to avoid. Contact your dietitian for more information. WHERE CAN I FIND MORE INFORMATION? National Heart, Lung, and Blood Institute: [employer] Document Released: 09/18/2011 Document Revised: 02/13/2014 Document Reviewed: 08/03/2013 [facility] Patient Information 2015 [place], [place]. This information is not intended to replace advice given to you by your health care provider. Make sure you discuss any questions you have with your health care provider.   Vitamin D goal is between 60-80  Please make sure that you are taking your Vitamin D as directed.   It is very  important as a natural anti-inflammatory   helping hair, skin, and nails, as well as reducing stroke and heart attack risk.   It helps your bones and helps with mood.  It also decreases numerous cancer risks so please take it as  directed.   Low Vit D is associated with a 200-300% higher risk for CANCER   and 200-300% higher risk for HEART   ATTACK  &  STROKE.    .....................................Marland Kitchen  It is also associated with higher death rate at younger ages,   autoimmune diseases like Rheumatoid arthritis, Lupus, Multiple Sclerosis.     Also many other serious conditions, like depression, Alzheimer's  Dementia, infertility, muscle aches, fatigue, fibromyalgia - just to name a few.  +++++++++++++++++++

## 2015-05-18 NOTE — Progress Notes (Signed)
Assessment and Plan:  1. Hypertension -Continue medication, monitor blood pressure at home and call if consistently above 140/90. Continue DASH diet.  Reminder to go to the ER if any CP, SOB, nausea, dizziness, severe HA, changes vision/speech, left arm numbness and tingling and jaw pain.  2. Cholesterol -Continue diet and exercise. Check cholesterol.   3. Prediabetes  -Continue diet and exercise. Check A1C  4. Vitamin D Def - check level and continue medications.  On 50,000 IU daily  5. Hair/nail issues with deficiencies Samples of fusion plus given  6. Gas/bloating Avoid certain foods  Continue diet and meds as discussed. Further disposition pending results of labs. Over 30 minutes of exam, counseling, chart review, and critical decision making was performed  HPI 49 y.o. female  presents for 3 month follow up on hypertension, cholesterol, prediabetes, and vitamin D deficiency.   Her blood pressure has been controlled at home, today their BP is BP: (!) 146/86 mmHg  She does not workout. She denies chest pain, shortness of breath, dizziness.  She is on cholesterol medication and denies myalgias. Her cholesterol is at goal. The cholesterol last visit was:   Lab Results  Component Value Date   CHOL 207* 01/16/2015   HDL 39* 01/16/2015   LDLCALC 99 01/16/2015   TRIG 345* 01/16/2015   CHOLHDL 5.3 01/16/2015    Last A1C in the office was:  Lab Results  Component Value Date   HGBA1C 5.5 01/16/2015   Patient is on Vitamin D supplement, 50,000 IU was added twice a week.   Lab Results  Component Value Date   VD25OH 5* 01/16/2015    She is on thyroid medication. Her medication was changed last visit, she was put back on once a day.    Lab Results  Component Value Date   TSH 2.632 01/16/2015   She was complaining of hair and nail issues last time,started on magnesium, vitamin d, iron and B12. She has stopped the magnesium, iron, and B12 due to stomach issues. Had recent normal  colonoscopy 11/2014. Has bloating/Ab tightness but she is not burping/beltching, denies consitpation/diarrhea, has more loose stools, 2 x a day. Had constipation with vitamins.  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    Current Medications:  Current Outpatient Prescriptions on File Prior to Visit  Medication Sig Dispense Refill  . clonazePAM (KLONOPIN) 1 MG tablet Take 2 mg by mouth 3 (three) times daily. Take 1/2 by mouth every am, 1/2 at lunch, and 1 every night    . ibuprofen (ADVIL,MOTRIN) 200 MG tablet Take 200 mg by mouth every 6 (six) hours as needed.    Marland Kitchen levothyroxine (SYNTHROID, LEVOTHROID) 50 MCG tablet Take 1 tablet (50 mcg total) by mouth daily. 30 tablet 2  . pravastatin (PRAVACHOL) 40 MG tablet Take 1 tablet (40 mg total) by mouth daily. 90 tablet 3  . sertraline (ZOLOFT) 100 MG tablet Take 200 mg by mouth 2 (two) times daily.     . Vitamin D, Ergocalciferol, (DRISDOL) 50000 UNITS CAPS capsule Take 1 capsule daily or  as directed for severe Vit D Deficiency 30 capsule 99   No current facility-administered medications on file prior to visit.   Medical History:  Past Medical History  Diagnosis Date  . Hepatomegaly   . Hemorrhoids 2008  . Anxiety   . Panic disorder   . Anal fissure 1994  . Depression   . Hypothyroidism   . Obesity   . Tobacco abuse     not tolerated Chantix in the past  . Endometriosis   . Fracture 08/2011    fracture left wrist and elbow  . OCD (obsessive compulsive disorder)   . Mixed hyperlipidemia   . Vitamin D deficiency   . RLS (restless legs syndrome) 2014    vis sleep study- no sleep  apnea  . Plantar fasciitis, right 03/2014   Allergies: No Known Allergies   Review of Systems:  Review of Systems  Constitutional: Positive for malaise/fatigue. Negative for fever, chills, weight loss and diaphoresis.  Eyes: Negative.   Respiratory: Negative.   Cardiovascular: Negative.   Gastrointestinal: Positive for abdominal pain. Negative for vomiting, diarrhea, constipation, blood in stool and melena.  Genitourinary: Negative.   Musculoskeletal: Positive for back pain, joint pain and neck pain. Negative for myalgias and falls.  Skin: Negative.  Negative for rash.  Neurological: Negative.  Negative for weakness.  Psychiatric/Behavioral: Negative for depression, suicidal ideas, hallucinations, memory loss and substance abuse. The patient is nervous/anxious. The patient does not have insomnia.     Family history- Review and unchanged Social history- Review and unchanged Physical Exam: BP 146/86 mmHg  Pulse 80  Temp(Src) 98.5 F (36.9 C)  Resp 16  Ht 5' 7.5" (1.715 m)  Wt 206 lb (93.441 kg)  BMI 31.77 kg/m2  LMP 08/04/2012 Wt Readings from Last 3 Encounters:  05/18/15 206 lb (93.441 kg)  01/16/15 208 lb (94.348 kg)  11/17/14 202 lb (91.627 kg)   General Appearance: Well nourished, in no apparent distress. Eyes: PERRLA, EOMs, conjunctiva no swelling or erythema Sinuses: No Frontal/maxillary tenderness ENT/Mouth: Ext aud canals clear, TMs without erythema, bulging. No erythema, swelling, or exudate on post pharynx.  Tonsils not swollen or erythematous. Hearing normal.  Neck: Supple, thyroid normal.  Respiratory: Respiratory effort normal, BS equal bilaterally without rales, rhonchi, wheezing or stridor.  Cardio: RRR with no MRGs. Brisk peripheral pulses without edema.  Abdomen: Soft, + BS,  Non tender, no guarding, rebound, hernias, masses. Lymphatics: Non tender without lymphadenopathy.  Musculoskeletal: Full ROM, 5/5 strength, Normal gait Skin: Warm, dry without  rashes, lesions, ecchymosis.  Neuro: Cranial nerves intact. Normal muscle tone, no cerebellar symptoms. Psych: Awake and oriented X 3, normal affect, Insight and Judgment appropriate.    Quentin Mulling, PA-C 11:37 AM [facility]

## 2015-05-19 LAB — VITAMIN D 25 HYDROXY (VIT D DEFICIENCY, FRACTURES): Vit D, 25-Hydroxy: 10 ng/mL — ABNORMAL LOW (ref 30–100)

## 2015-06-20 DIAGNOSIS — H9203 Otalgia, bilateral: Secondary | ICD-10-CM | POA: Diagnosis not present

## 2015-06-20 DIAGNOSIS — J310 Chronic rhinitis: Secondary | ICD-10-CM | POA: Diagnosis not present

## 2015-07-08 ENCOUNTER — Other Ambulatory Visit: Payer: Self-pay | Admitting: Obstetrics & Gynecology

## 2015-07-09 NOTE — Telephone Encounter (Addendum)
Medication refill request: synthroid  Last AEX:  06/28/14 SM Next AEX: None Last MMG (if hormonal medication request): 03/13/14 [place] Right BIRADS3:Probably Benign  Refill authorized: 03/16/15 #30tabs w/ 2 Refill  Called patient. Scheduled AEX 09/04/15 :00pm. [facility].   Routed to Dr. Edward Jolly

## 2015-09-04 ENCOUNTER — Encounter: Payer: Self-pay | Admitting: Obstetrics & Gynecology

## 2015-09-04 ENCOUNTER — Other Ambulatory Visit: Payer: Self-pay | Admitting: Obstetrics & Gynecology

## 2015-09-04 ENCOUNTER — Ambulatory Visit (INDEPENDENT_AMBULATORY_CARE_PROVIDER_SITE_OTHER): Payer: Medicare Other | Admitting: Obstetrics & Gynecology

## 2015-09-04 VITALS — BP 120/80 | HR 68 | Resp 16 | Ht 66.75 in | Wt 215.0 lb

## 2015-09-04 DIAGNOSIS — Z01419 Encounter for gynecological examination (general) (routine) without abnormal findings: Secondary | ICD-10-CM | POA: Diagnosis not present

## 2015-09-04 DIAGNOSIS — N63 Unspecified lump in unspecified breast: Secondary | ICD-10-CM

## 2015-09-04 DIAGNOSIS — Z124 Encounter for screening for malignant neoplasm of cervix: Secondary | ICD-10-CM

## 2015-09-04 DIAGNOSIS — E038 Other specified hypothyroidism: Secondary | ICD-10-CM | POA: Diagnosis not present

## 2015-09-04 DIAGNOSIS — E559 Vitamin D deficiency, unspecified: Secondary | ICD-10-CM

## 2015-09-04 LAB — TSH: TSH: 4.275 u[IU]/mL (ref 0.350–4.500)

## 2015-09-04 NOTE — Progress Notes (Signed)
Scheduled patient while in office for bilateral diagnostic mammogram at [facility] as she is over due for 6 month follow up and due for screening mammogram. Appointment scheduled for 09/12/2015 at 2:30 pm.

## 2015-09-04 NOTE — Progress Notes (Signed)
49 y.o. G1P1 SingleCaucasianF here for annual exam.  Denies vaginal bleeding.  Mother is with hospice now.  She is trying to keep her mother at home.    PCP: Dr. Oneta RackMcKeown.  Had lab work 8/16.     Patient's last menstrual period was 08/04/2012.          Sexually active: No.  The current method of family planning is post menopausal status.    Exercising: No.  not regularly Smoker:  yes  Health Maintenance: Pap:  06/28/14 WNL, neg HR HPV 2012 History of abnormal Pap:  no MMG:  03/07/14 3D MMG, 03/13/14 right breast-BiRads 3 probably benign-needs 6 month right diag-has not been for follow up Colonoscopy:  10/15-repeat in 5 years BMD:   2008-heel test TDaP:  2014 Screening Labs: in August, Hb today: in August, Urine today: PROTEIN-trace   reports that she has been smoking Cigarettes.  She has been smoking about 1.00 pack per day. She has never used smokeless tobacco. She reports that she does not drink alcohol or use illicit drugs.  Past Medical History  Diagnosis Date  . Hepatomegaly   . Hemorrhoids 2008  . Anxiety   . Panic disorder   . Anal fissure 1994  . Depression   . Hypothyroidism   . Obesity   . Tobacco abuse     not tolerated Chantix in the past  . Endometriosis   . Fracture 08/2011    fracture left wrist and elbow  . OCD (obsessive compulsive disorder)   . Mixed hyperlipidemia   . Vitamin D deficiency   . RLS (restless legs syndrome) 2014    vis sleep study- no sleep apnea  . Plantar fasciitis, right 03/2014    Past Surgical History  Procedure Laterality Date  . Colonoscopy  03/25/2007    small external hemorroids  . Hemorroidectomy    . Cholecystectomy    . Left oophorectomy    . Tonsillectomy      Current Outpatient Prescriptions  Medication Sig Dispense Refill  . clonazePAM (KLONOPIN) 1 MG tablet Take 2 mg by mouth 3 (three) times daily. Take 1/2 by mouth every am, 1/2 at lunch, and 1 every night    . fluticasone (FLONASE) 50 MCG/ACT nasal spray as needed.     Marland Kitchen. ibuprofen (ADVIL,MOTRIN) 200 MG tablet Take 200 mg by mouth every 6 (six) hours as needed.    Marland Kitchen. levothyroxine (SYNTHROID, LEVOTHROID) 50 MCG tablet TAKE ONE TABLET BY MOUTH ONCE DAILY 30 tablet 1  . meclizine (ANTIVERT) 25 MG tablet As needed    . pravastatin (PRAVACHOL) 40 MG tablet Take 1 tablet (40 mg total) by mouth daily. 90 tablet 3  . sertraline (ZOLOFT) 100 MG tablet Take 200 mg by mouth 2 (two) times daily.     . Vitamin D, Ergocalciferol, (DRISDOL) 50000 UNITS CAPS capsule Take 1 capsule daily or as directed for severe Vit D Deficiency 30 capsule 99   No current facility-administered medications for this visit.    Family History  Problem Relation Age of Onset  . Colon cancer Maternal Grandmother 50  . Breast cancer Mother   . Diabetes Mother   . Drug abuse Brother     and alcohol  . Cirrhosis Father     alcohol  . Heart disease Father     MI  . Alcohol abuse Sister   . Thyroid disease Neg Hx     ROS:  Pertinent items are noted in HPI.  Otherwise, a comprehensive ROS was  negative.  Exam:   General appearance: alert, cooperative and appears stated age Head: Normocephalic, without obvious abnormality, atraumatic Neck: no adenopathy, supple, symmetrical, trachea midline and thyroid normal to inspection and palpation Lungs: clear to auscultation bilaterally Breasts: normal appearance, no masses or tenderness Heart: regular rate and rhythm Abdomen: soft, non-tender; bowel sounds normal; no masses,  no organomegaly Extremities: extremities normal, atraumatic, no cyanosis or edema Skin: Skin color, texture, turgor normal. No rashes or lesions Lymph nodes: Cervical, supraclavicular, and axillary nodes normal. No abnormal inguinal nodes palpated Neurologic: Grossly normal  Pelvic: External genitalia:  no lesions              Urethra:  normal appearing urethra with no masses, tenderness or lesions              Bartholins and Skenes: normal                 Vagina:  normal appearing vagina with normal color and discharge, no lesions              Cervix: no lesions              Pap taken: Yes.   Bimanual Exam:  Uterus:  normal size, contour, position, consistency, mobility, non-tender              Adnexa: normal adnexa and no mass, fullness, tenderness               Rectovaginal: Confirms               Anus:  normal sphincter tone, no lesions  Chaperone was present for exam.  A:  Well Woman with normal exam  PMP, no HRT  Smoker  Anxiety/Panic disorder  Hypothyroidism  P: Mammogram follow up needs to be scheduled.  Pt willing for [place] to do that today. Pap obtained.  Desires yearly Pap smear. Labs with Dr. Astrid Divine. TSH and Vit D. she is taking 50k weekly.  Pt states she is taking this regularly. return annually or prn

## 2015-09-05 LAB — VITAMIN D 25 HYDROXY (VIT D DEFICIENCY, FRACTURES): Vit D, 25-Hydroxy: 18 ng/mL — ABNORMAL LOW (ref 30–100)

## 2015-09-07 ENCOUNTER — Other Ambulatory Visit: Payer: Self-pay | Admitting: Obstetrics & Gynecology

## 2015-09-10 ENCOUNTER — Telehealth: Payer: Self-pay | Admitting: *Deleted

## 2015-09-10 NOTE — Telephone Encounter (Signed)
Notes Recorded by Dion Bodyeina C Beltran, CMA on 09/10/2015 at 11:25 AM LM for pt to call back. Notes Recorded by Jerene BearsMary S Miller, MD on 09/07/2015 at 7:19 AM Inform pt Vit D is better. It is 18. I'd like her to take the Vit D 50K twice weekly and repeat lab 12 weeks. I will put in orders after she has been called. Thanks. TSH is now 4.2. It has gradually increased this past year. I think she should increase from 50 to 66mcg synthroid daily. Rx will be sent in after pt notified.  Will recheck TSH in three months as well. Thanks.

## 2015-09-10 NOTE — Telephone Encounter (Signed)
Patient notified of results. 3 month lab appointment made. [facility] [address]

## 2015-09-11 LAB — IPS PAP TEST WITH REFLEX TO HPV

## 2015-09-12 ENCOUNTER — Other Ambulatory Visit: Payer: Medicare Other

## 2015-09-13 ENCOUNTER — Telehealth: Payer: Self-pay | Admitting: Obstetrics & Gynecology

## 2015-09-13 DIAGNOSIS — E038 Other specified hypothyroidism: Secondary | ICD-10-CM

## 2015-09-13 DIAGNOSIS — E559 Vitamin D deficiency, unspecified: Secondary | ICD-10-CM

## 2015-09-13 MED ORDER — LEVOTHYROXINE SODIUM 75 MCG PO TABS: 75.0000 ug | ORAL_TABLET | Freq: Every day | ORAL | Status: DC

## 2015-09-13 NOTE — Telephone Encounter (Signed)
Please let her know the RF was done.  I ordered it for 30 day supply with RFs.  Please confirm this is ok vs 90 day supply.  Also, there is not a 66mcg.  The next step is a223154AdSallye ObASte Gen iMed [facility] C25enSwazilMontanaN(34340-6Marland KitchenRic(210)29Ronnald ColluM63urCharlannH480-IleDonnetta HuLindie SMetro Health HospitalngluarrysNicholClini<MEASU81RRub409[facility]orial Hospital At Gulfpor5084AdSallye ObACommunity Memorial6 JSand Lake Sur(559)1Marland KitchenRic(952)12Ronnald ColluM48urCharlann BoxeCorlissNicholClinH315 IleDonnetta HuLindie SBroward Health Coral Springsngluarrych assCons18uRub409AdventAdSallye ObAMcleod Medical Ce22nJDon<MEASUREMENT616-41730MRub409AAdSallye ObAFredericksburg Ambulatory Surgery1 JDonnam iRiverside Surger32y Swazil(213)3Marland KitchenRic(3454Ronnald ColluM65urCharlann BoxeCorlH920 IleDonnetta HuLindie SEastside Associates LLCngl62uRub409Camp [facility] LLC AdSallye Ob pKadlec Regional Medica16l 772 1Marland KitchenRic(90665Ronnald ColluM73urCharlann BoxeCor401-IleDonnetta HuLindie SJohn Muir Behavioral Health eScottsdale Eye Insti50(613) 0Marland KitchenRic62855Ronnald ColluM2urCharlann BoxeCorlissNicholClinical research assConsulting civiH234 IleDonnetta HuLindie SApex Surgery Centerngluarryrans Affairs<MEASUR79AdSallye ObAKaiser Fnd Hosp - Sa869Rub409Essex EndAdSallye ObANorthern Nevada Med73iJD<MEASUREMENT35>(669) 61MelburnAd lMidmichigan Medical Center36-MSwazilMontanaNebraskCedar P343-5Marland KitchenRic72434Ronnald ColluM32urCharlann BoxeCorlissNicH(617)IleDonnetta HuLindie SCentral Alabama Veterans Health Care System East Campusngluarry research as<MEASUREMENT68>870-W2N5926Rub40AdSallye ObAUn eMilwaukee Surgical Su25itSwazilMontanaNebraskMercy Hospi548-5Marland KitchenRic(331)61Ronnald ColluM28urCharlann BoxeCorlissNicholClinical reH503-IleDonnetta HuLindie SWillow Crest Hospitalngluarryonsulting ci<MEASUREMENT37>765-W2N56SwaziAnnU(3 )[facility] Surger2(9(671)5Marland KitchenRic551Ronnald ColluM17urCharlann BoxeCorlissNiH747-IleDonnetta HuLindie SConnecticut Childbirth & Women'S eGeorgia Surgical Center On Peach56trSwa250F438-3Marland KitchenRic(24875Ronnald ColluM44urCharlann BoxeCorlissNiH787-IleDonnetta HuLindie SSaint Luke'S Cushing Hospitalngluarryl research a<MEASUREMENT61>(716)W2N56SwaziAnnBaptist Surgery And Endoscopy Centers LLC Dba Baptist Hea(343)477Rub409North State Surgery Centers Dba MeAdSa yRaulerson 4HoSwazilMonta310-1Marland KitchenRic73149Ronnald ColluM43urCharlann BoxeCorlissNicholClinical research assConsultH972-IleDonnetta HuLindie STucson Gastroenterology Institute LLCngluarry51ngNorth MAdSallye ObARenown Sout MCuba Memorial 10H414W709-5Marland KitchenRic68027Ronnald ColluM10urCharlann BoxeCoH(832)IleDonnetta HuLindie SSterling Surgical Center LLCngluarryClinical res<MEASUREMENT70>559-W2N56SwaziAnnBaylo(976AdSallye ObATristar Southern lBaylor Emergency Medical Center A33t Swaz717-6Marland KitchenRic38571Ronnald ColluM27urCharlann BoxeCorliH74IleDonnetta HuLindie SNortheast Rehabilitation H pGarfield County Public 17HoSwazilMo647 0Marland KitchenRic(984)82Ronnald ColluM39urCharlann BoxeCH(443) IleDonnetta HuLindie SCentinela Hospital Medical CenterngluarrylClinical re<MEASUREME508 94Melburn PEntMerwick60 Rub409AdSallye ObAOrlando Fl Endoscopy Asc LLC Dba Citrus Ambulatory Sur45gJDonnamarie PoaLupitaKLau<MEASUREMEN66T(367) 47WRub409Southeast [place] HeAdSallye ObATower Wound Care nHardin County General 81HoSwazilMontanaNebra717-6Marland KitchenRic90164Ronnald ColluM24urCharlann BoxeCorlissNicholClinical research612 IleDonnettaAd57<MEASUREMEN810-768Rub409Livingston HealASallye ObAEncompass Health La sVa Maine Healthcare Syst54emSwazilMontanaNeb812-4Marland KitchenRic870Ronnald ColluM18urCharlann BoxeCorlissNicH(845)IleDonnetta HuLindie SVibra Rehabilitation Hospital Of Amarillongluarry research as<MEASUREMENT67>812-W2N56Sw6AdSallye ObAMayo Clinic Health System Ea CChristian Hospital Northeast-N69orSwazilMontanaNebraskWarm Springs Rehabilitation Hospita(785)2Marland KitchenRic5147Ronnald ColluM53urCharlann BoxeCorlissNicholClinical reseH209-IleDonnetta HuLindie SVirginia Gay (865)362Melburn PLaser And Outpatient Surgery CentSaginaAdSallye ObAGulf uSurgcenter C22amSwazilMon782-5Marland KitchenRic(9514Ronnald ColluM71urCH478-IleDonnetta HuLindie SBall Outpatient Surgery Center LLCngluarryeCorlissNich<MEASUREMENT46>(272) W2N56SwaziAnnHosp MHi19Lindie Sprucerio QuarryRub40A336Mar<MEASUREMENT18>757-W2N56Swa48(Rub409Pam Specialty HospAdSallye ObAC mUnicoi County Memorial 60HoSwazilMontanaNebra(484-7Marland KitchenRic(76550Ronnald ColluM31urCharlann BoxeCorlissNicholClinical research aH425-IleDonnetta HuLindie SOchsner Baptist Medical Centerngluarryg civil e65n<MEASUREMENT >[facility] Dba [place] [facility]rology Surger65y SwazilMon339R(734)1Marland KitchenRic65135Ronnald ColluM89urCharlann BoxeCorH(703)IleDonnetta HuLindie SIntegrity Transitional Hospitalngluarrylinical rese<MEASUREMENT 7[facility] 11HoSwazilM(4216 2Marland KitchenRic(47045Ronnald ColluM6urCharlann BoxeCorlisH(940)IleDonnetta HuLindie SChristus Southeast Texas Orthopedic Specialty Centerngluarryical researc<MEASUREM51AdSallye ObACornerstone Hospital Of Houston -69 J<MEASUREMEN57T46W2N56SwaziAnnSt87460Rub409New Cedar Lake Surgery Center LLASallye ObASelect Sp iRogers Memorial Hospital Br8owSwazilMontana774810Marland KitchenRic47987Ronnald ColluM79urCharlann BoxeCorlissNicholClinical resH940-IleDonnetta HuLindie SCape Surgery Center LLCngluarrynsulting civ<MEASUREMA<MEASUREMEN161United States Virgin Is5957lJam430Consulting civil e13n2m418Marshfield Medical Ctr Neil vSeven Hills Ambulatory Surger81y SwazilM407Marland KitchenRic35213Ronnald ColluM74urCharlann BoxeCorlisH(979)IleDonnetta HuLindie SOhio State University Hospitalsngluarryical researc<MEASUREMENT42>(517)W2N56Sw4aRub409AleASallye ObAAdventist Midwest Health Db ALivonia Outpatient Surgery Ce65ntSwazilMonta(929224-2Marland KitchenRic919Ronnald ColluM102urCharlann BoxeCorlissNicholH939 IleDonnetta HuLindie SCarepoint Health-Christ Hospitalngluarrysearch assCo<MEASUREMENT3>727-W2N56SwaziA78nRub409PhysiciAdSallye ObASilver Oaks Behavori69aJDonna<MEASUREMENT79>(7537Rub409Centra Southside CommAdSallye ObAComAdSallye ObAPresbyterian St Luke'S<MEASUREMENT76>248-W102Rub409Gpddc LL2AdSallye ObAValle OAdak Medical Cent62erSwazilMontanaN252-0Marland KitchenRic2187Ronnald ColluM23urCharlann BoxeCorlissNicholCH478-IleDonnetta HuLindie SNorthwestern Medical Centerngluarryearch assCon<MEASUREMENT74>423-W2N56SwaziAnnBlue Island Hospital (417)42 Rub409[facility] Town & CounAdSallye ObANew [facility] Ortho<MEAS70URub409MeAdSallye ObAValley Children3<MEASUR276-8Marland KitchenRic(26231Ronnald C28olCharlann BoxeCorlissNicholClini838-IleDonnetta HuLindie SMississippi Coast Endoscopy And Ambulatory Cen rLandmark Hospital Of716-7Marland KitchenRic71813Ronnald ColluM33urCharlann BoxeCorlissNicholCli(504)IleDonnetta HuLindie SCumberland Valley Surgery Centerngluarryrch assConsu<MEASUREMENT67>772-W2N56Swazi48ARub409Sanford Health DAAdSallye ObASt Francis Med<MEASUREMENT82>216-W2N56SwaziAn53nRAdSall Texas Emergency 6(805)7Marland KitchenRic8168Ronnald ColluM45urCharlann BoxeCorlissNicholClinH97IleDonnetta HuLindie SCarson Tahoe Regional Medical Centerngluarrych assConsul<MEASUREMENT56>22W2N56SwaAdSallye ObAPlatinum Sur53gJD<MEASUREMENT53AdSallNoHi49Lindie Sprucerio QuarrySwazilMo4Mar<MEASUREMENT41>(818)W2N56SwaziAnnSelect Sp360-768Melburn PIntern54AdSallye ObALenox Hi28lJDonnamarie PoaLupitaKLViFSwazilaTr55uAEncompass HealtT323 H<MEASUREMENT11>9AdSallye ObAIsland Digestive Health61 JDonnamarie 36PRuAdSallye ObAEncompass Health Reh i[facility] - [facility] D Weiler [facility] Of A Einstein 727-1Marland KitchenRic67836Ronnald ColluM2urCharlann H843-IleDonnetta HuLindie SAdventhealth OrlandongluarryNicholClinic<MEASUREMENT75>43W2N5661SRub409Eye And LAdSallye ObASouthcoast Ho iSouth Bay 13HoSwazilMontanaNebraskUn425-3Marland KitchenRic71966Ronnald ColluM75urCharlann BoxeCorlissNicholCH508-IleDonnetta HuLindie SOutpatient Surgery Center Of Hilton Headngluarryearch assCon<MEASUREMENT65>775-W2N56SwaziAnnCourt936-33206RuAdSallye ObASou eAcoma-Canoncito-Laguna (Acl) 55HoSwazilMontanaNebraskJay 518-2Marland KitchenRic48080Ronnald ColluM78urCharlann BoxeCorlissNicholCH949 IleDonnetta HuLindie SCaldwell Memorial HospitalngluarryeAdSallye bPort Orange Endoscopy And Surger42y SwazilMontanaNebraskPain Dia415Marland KitchenRic3070Ronnald ColluM11urCharlann BH225 IleDonnetta HuLindie SAhmc Anaheim Regional Medical CenterngluarryicholClinic MSyosset 27HoSwazilMontanaNebraskNo(787) 0Marland KitchenRic50863Ronnald ColluM33urCharlann BoxeCorlissNicholClinicH(940)IleDonnetta HuLindie SFaith Community Hospitalngluarry assConsulti<MEASUREMENASallye bUnitypoint Health-Meriter Child And Adolescent Psych 72HoSwazilMontanaNebras425-6Marland KitchenRic58Ronnald ColluM21urCharlann BoxeCorlissNichoH816-IleDonnetta HuLindie SLawrence County Hospitalngluarryesearch assC<MEASUREMENT65>520-W2N56Sw85aRub409Georgia SAdSallye ObADelawa Sapling Grove Ambulatory Surgery Ce60ntSwaz858-0Marland KitchenRic(83849Ronnald ColluM47urCharlann BoxeCorlissNiH570-IleDonnetta HuLindie SOsceola Regional Medical Centerngluarryl research a<MEASUREMENT30>516-AdSallye ObARiverside Beh iPark Place Surgical 67HoSwazilM204 5Marland KitchenRic(20839Ronnald ColluM63urCharlann BoxeCorlissNichoH(763)IleDonnetta HuLindie SShands Live Oak Regional Medical Centerngluarryesearch assC<MEASUREMEBabs302DallasMemorial Hermann Surgery Center KingSurgical Center Of North Flori4Ba45hSallye bHosp Metropolitano De 54SaSwazilMontanaNebraskTemecula Ca United Surgery Center LP484Emer336 8Marland KitchenRic53Ronnald ColluM19urCharlann BoxeCorlissNicholH50IleDonnetta HuLindie STrinity Muscatinengluarrysearch assCo<MEASUREMENT43>29Rub409Fresno Va Medical CenAdSallye ObAMemorial Hospital Of R75hJDonnam<MEASUREMEN2T(612)W2N56SwaziAAdSallye ObAUniversity Of Md Shore Medical C<MEASURAd lProvidence Surger81y SwazilMont212623-1Marland KitchenRic25263Ronnald ColluM87urCharlann BoxeCorlissNicholClinicaH(309)IleDonnetta HuLindie SAloha Eye Cli cBoston Endoscopy Ce39nt(815)301-8Marland KitchenRic84844Ronnald ColluM24urCharlann BoxeCorlissH819-IleDonnetta HuLindie SPenn Presbyterian Medical eEndoscopy Center O587-8Marland KitchenRic71562Ronnald ColluM55urCharlann BoxeCorlissNiH(210)IleDonnetta HuLindie SCoast Surgery Center LPngluarryl research a<MEASUREMENT61>(978)W2N56SwaziAnnNmc Surgery Cente53rRub409AdSallye ObAVan Matre Encompas Health Rehabilitation Hospi lEncompass Health Rehabilitation Hospital Of 2LaSwazilMontan210-6Marland KitchenRic(9312Ronnald ColluM4urCharlann BoxeCH(661)IleDonnetta HuLindie SBridgepoint National HarborngluarrylClinical re<MEASUREMENT41>579AdSallye ObAEncompass Health eSumma Wadsworth-Rittman 72HoSwa361 1Marland KitchenRic(36166Ronnald ColluM70urCharlann BoxeCorlissNicholH780 IleDonnetta HuLindie SJack C. Montgomery Va Medical Centerngluarrysearch assCo<MEASUREMENT50>250-W2N56SwaziAnnSt.29 Rub409Wahiawa General Hospita6ASallye O MOptima Specialty 68HoSwazilMontanaN(438) 2Marland KitchenRic(73162Ronnald ColluM31urCharlann BoxeCorlissNicholClinH(425)IleDonnetta HuLindie SMoundview Mem Hsptl And Clinicsngluarrych assConsul<MEASUREMENT73>78AdSallye ObAKingm Men[place] Park Surgical 4HoSwazilMontanaNebrask847-8Marland KitchenRic91427Ronnald ColluM62urCharlann BoxeCorlissH903-IleDonnetta HuLindie SAirport Endoscopy Centerngluarrycal research<MEASUREMENT79>705-W2N56SwaziAnnPeterson Reha(Hi30Lindie Sprucerio QuarryeRub4AdSaMar<MEASURE81MR SAugusta Medica4805Marland KitchenRic8757Ronnald ColluM23urCharlann BoxeCorlissNH905-IleDonnetta HuLindie SGuadalupe Regional Medical Centerngluarryal research <MEASUREMENT27>445-W2N540591Rub4AdSallye ObASt <MEASUREMENT35Rub409Dequincy MemorAdSallye ObAExcela Health Westmorela34nJDonnamarie60 Rub409Covenant AdSallye ObAMease Countrysi46dJDonnamarie PoaLupitaKLauraBotswanalProgramme researcher, bro1.[facility] G>Thank you.

## 2015-09-13 NOTE — Telephone Encounter (Signed)
Spoke with patient at time of incoming call. Patient states that she spoke with Susan ReidKelly Bartlett, CMA a couple of days ago regarding her lab results. Was supposed to have a new rx for Synthroid sent to pharmacy. Patient states the pharmacy does not have the new dosage. Patient is agreeable. Already has a lab recheck scheduled for 2/28 at 11 am. Routing to Dr.Miller to clarify dose before sending.  Notes Recorded by Elisha HeadlandKelly S Bartlett, CMA on 09/11/2015 at 10:14 AM Patient notified of results. See telephone note.//kn Notes Recorded by Susan Bartlett, CMA on 09/10/2015 at 11:25 AM LM for pt to call back. Notes Recorded by Susan BearsMary S Miller, MD on 09/07/2015 at 7:19 AM Inform pt Vit D is better. It is 18. I'd like her to take the Vit D 50K twice weekly and repeat lab 12 weeks. I will put in orders after she has been called. Thanks. TSH is now 4.2. It has gradually increased this past year. I think she should increase from 50 to 66mcg synthroid daily. Rx will be sent in after pt notified.  Will recheck TSH in three months as well. Thanks.  Dr.Miller, Please clarify dose.

## 2015-09-13 NOTE — Telephone Encounter (Signed)
Spoke with patient. Advised of message as seen below from Dr.Miller. Patient prefers the rx be sent in 90 day supply. Rx changed to Synthroid 75 mcg daily #90 1RF. Patient is agreeable.  Routing to provider for final review. Patient agreeable to disposition. Will close encounter.

## 2015-09-14 ENCOUNTER — Telehealth: Payer: Self-pay

## 2015-09-14 NOTE — Telephone Encounter (Signed)
Left message to call Kaitlyn at [phone]. 

## 2015-09-14 NOTE — Telephone Encounter (Signed)
-----   Message from Jerene BearsMary S Miller, MD sent at 09/13/2015  5:39 PM EST ----- 02 recall.  Please let pt know her Pap showed bacterial change.  If having symptoms, ok to treat with Metrogel 0.75% qhs x 5 days.  This is common on pap results so doesn't need to be treated if not symptomatic.

## 2015-09-19 MED ORDER — METRONIDAZOLE 0.75 % VA GEL: VAGINAL | Status: DC

## 2015-09-19 NOTE — Telephone Encounter (Signed)
Spoke with patient. Results given as seen below. Patient states that she has been having some vaginal itching and irritation. Would like rx for Metrogel at this time. [facility] [address] [facility] patient request.  Routing to provider for final review. Patient agreeable to disposition. Will close encounter.

## 2015-10-26 ENCOUNTER — Telehealth: Payer: Self-pay | Admitting: Emergency Medicine

## 2015-10-26 NOTE — Telephone Encounter (Signed)
Dr. Hyacinth MeekerMiller,  Patient was scheduled while in office for recall mammogram that was overdue.  She did not keep appointment for diagnostic mammogram and ultrasound of left breast.  Please advise for recall.

## 2015-10-28 NOTE — Telephone Encounter (Signed)
She needs letter sent and then will remove from hold and recall.  We discussed the importance of this so I know she is aware.  Thanks.

## 2015-10-29 ENCOUNTER — Encounter: Payer: Self-pay | Admitting: Emergency Medicine

## 2015-10-29 NOTE — Telephone Encounter (Signed)
Letter printed and to Dr. Hyacinth MeekerMiller for review/signature.

## 2015-10-30 NOTE — Telephone Encounter (Signed)
Letter printed, signed by Dr. Hyacinth Meeker and mailed today to home address via certified mail. Removed from current hold and recall.   Will close encounter.

## 2015-12-11 ENCOUNTER — Other Ambulatory Visit (INDEPENDENT_AMBULATORY_CARE_PROVIDER_SITE_OTHER): Payer: Medicare Other

## 2015-12-11 DIAGNOSIS — E559 Vitamin D deficiency, unspecified: Secondary | ICD-10-CM

## 2015-12-11 DIAGNOSIS — E038 Other specified hypothyroidism: Secondary | ICD-10-CM | POA: Diagnosis not present

## 2015-12-11 LAB — TSH: TSH: 4.15 mIU/L

## 2015-12-12 LAB — VITAMIN D 25 HYDROXY (VIT D DEFICIENCY, FRACTURES): Vit D, 25-Hydroxy: 13 ng/mL — ABNORMAL LOW (ref 30–100)

## 2015-12-17 ENCOUNTER — Telehealth: Payer: Self-pay | Admitting: *Deleted

## 2015-12-17 NOTE — Telephone Encounter (Signed)
Notes Recorded by Dion Bodyeina C Beltran, CMA on 12/17/2015 at 9:10 AM Pt notified. Verbalized understanding. Notes Recorded by Jerene BearsMary S Miller, MD on 12/16/2015 at 9:17 PM Also, please let pt know that her thyroid level was normal as well. Thanks. Notes Recorded by Jerene BearsMary S Miller, MD on 12/16/2015 at 9:15 PM Please call pt and let her know her Vit D is still low. She needs to have a PTH with intact calcium level drawn. I have placed order. If this is normal, she will need to follow up with endocrinology.

## 2015-12-17 NOTE — Telephone Encounter (Signed)
Patient went to see Dr. Everardo AllEllison 08/10/14 and then has been followed by Quentin MullingAmanda Collier for Chronic primary hypothyroidism.  Notes are in Epic.

## 2015-12-17 NOTE — Telephone Encounter (Signed)
Patient states she has not been taking her Vit D as prescribed. She has forgotten to take it many times. She wants to try taking it constantly as prescribed and then come back for a check. Pt not sure how often she is supposed to take it. She will call back to let us know. Chart states PCP gave her last refill.   She also states she went to an endocrinologist last year.   Should she do another 12 weeks of vit D and then check Vit D and PTH with intact calcium levels? Or do you want her to go ahead and just come in now for PTH with Intact calcium check?   Please advise.

## 2015-12-17 NOTE — Telephone Encounter (Signed)
This is Dr. Edward JollySilva reviewing Dr. Rondel BatonMIller's inbox in her absence.  Please get a copy of the visit from her endocrinologist so we can see what she has already done there in terms of her lab work.  Dr. Hyacinth MeekerMiller can then decide what  Patient needs in follow up.   Cc- Dr. Hyacinth MeekerMiller

## 2016-01-01 ENCOUNTER — Encounter: Payer: Self-pay | Admitting: Physician Assistant

## 2016-01-24 ENCOUNTER — Encounter: Payer: Self-pay | Admitting: Physician Assistant

## 2016-01-31 ENCOUNTER — Encounter: Payer: Self-pay | Admitting: Internal Medicine

## 2016-01-31 ENCOUNTER — Ambulatory Visit (INDEPENDENT_AMBULATORY_CARE_PROVIDER_SITE_OTHER): Payer: Medicare Other | Admitting: Internal Medicine

## 2016-01-31 VITALS — BP 146/78 | HR 96 | Temp 98.2°F | Resp 16

## 2016-01-31 DIAGNOSIS — R7303 Prediabetes: Secondary | ICD-10-CM | POA: Diagnosis not present

## 2016-01-31 DIAGNOSIS — E559 Vitamin D deficiency, unspecified: Secondary | ICD-10-CM | POA: Diagnosis not present

## 2016-01-31 DIAGNOSIS — J069 Acute upper respiratory infection, unspecified: Secondary | ICD-10-CM | POA: Diagnosis not present

## 2016-01-31 DIAGNOSIS — R7309 Other abnormal glucose: Secondary | ICD-10-CM | POA: Diagnosis not present

## 2016-01-31 DIAGNOSIS — R300 Dysuria: Secondary | ICD-10-CM

## 2016-01-31 DIAGNOSIS — Z79899 Other long term (current) drug therapy: Secondary | ICD-10-CM

## 2016-01-31 DIAGNOSIS — E785 Hyperlipidemia, unspecified: Secondary | ICD-10-CM

## 2016-01-31 DIAGNOSIS — E039 Hypothyroidism, unspecified: Secondary | ICD-10-CM | POA: Diagnosis not present

## 2016-01-31 LAB — HEPATIC FUNCTION PANEL
ALBUMIN: 4.2 g/dL (ref 3.6–5.1)
ALK PHOS: 92 U/L (ref 33–130)
ALT: 14 U/L (ref 6–29)
AST: 16 U/L (ref 10–35)
Bilirubin, Direct: <0.1 mg/dL (ref <=0.2)
TOTAL PROTEIN: 6.5 g/dL (ref 6.1–8.1)
Total Bilirubin: 0.4 mg/dL (ref 0.2–1.2)

## 2016-01-31 LAB — CBC WITH DIFFERENTIAL/PLATELET
BASOS PCT: 1 %
Basophils Absolute: 99 cells/uL (ref 0–200)
EOS ABS: 792 {cells}/uL — AB (ref 15–500)
EOS PCT: 8 %
HCT: 40.4 % (ref 35.0–45.0)
HEMOGLOBIN: 13.4 g/dL (ref 11.7–15.5)
LYMPHS ABS: 1683 {cells}/uL (ref 850–3900)
Lymphocytes Relative: 17 %
MCH: 29.9 pg (ref 27.0–33.0)
MCHC: 33.2 g/dL (ref 32.0–36.0)
MCV: 90.2 fL (ref 80.0–100.0)
MONOS PCT: 6 %
MPV: 10.6 fL (ref 7.5–12.5)
Monocytes Absolute: 594 cells/uL (ref 200–950)
NEUTROS ABS: 6732 {cells}/uL (ref 1500–7800)
Neutrophils Relative %: 68 %
PLATELETS: 310 10*3/uL (ref 140–400)
RBC: 4.48 MIL/uL (ref 3.80–5.10)
RDW: 14.1 % (ref 11.0–15.0)
WBC: 9.9 10*3/uL (ref 3.8–10.8)

## 2016-01-31 LAB — BASIC METABOLIC PANEL WITH GFR
BUN: 7 mg/dL (ref 7–25)
CHLORIDE: 101 mmol/L (ref 98–110)
CO2: 25 mmol/L (ref 20–31)
Calcium: 9.8 mg/dL (ref 8.6–10.4)
Creat: 0.77 mg/dL (ref 0.50–1.05)
GFR, Est African American: >89 mL/min (ref >=60)
GFR, Est Non African American: >89 mL/min (ref >=60)
GLUCOSE: 76 mg/dL (ref 65–99)
POTASSIUM: 4.1 mmol/L (ref 3.5–5.3)
Sodium: 136 mmol/L (ref 135–146)

## 2016-01-31 LAB — LIPID PANEL
CHOL/HDL RATIO: 8 ratio — AB (ref <=5.0)
Cholesterol: 265 mg/dL — ABNORMAL HIGH (ref 125–200)
HDL: 33 mg/dL — ABNORMAL LOW (ref >=46)
Triglycerides: 626 mg/dL — ABNORMAL HIGH (ref <150)

## 2016-01-31 LAB — TSH: TSH: 3.42 mIU/L

## 2016-01-31 LAB — HEMOGLOBIN A1C
HEMOGLOBIN A1C: 5.4 % (ref <5.7)
MEAN PLASMA GLUCOSE: 108 mg/dL

## 2016-01-31 MED ORDER — AZITHROMYCIN 250 MG PO TABS: ORAL_TABLET | ORAL | Status: DC

## 2016-01-31 MED ORDER — PREDNISONE 20 MG PO TABS: ORAL_TABLET | ORAL | Status: DC

## 2016-01-31 MED ORDER — FLUTICASONE PROPIONATE 50 MCG/ACT NA SUSP: 2.0000 | Freq: Every day | NASAL | Status: DC

## 2016-01-31 NOTE — Progress Notes (Addendum)
Subjective:    Patient ID: Susan Bartlett, female    DOB: 1966-03-29, [age] y.o.   MRN: [id number]  Headache  Pertinent negatives include no abdominal pain, coughing, dizziness, fever, nausea, numbness, vomiting or weakness.  Patient presents to the office for evaluation of fatigue.  She reports that she has not been taking her thyroid medication as she is supposed to be taking.  She usually takes it daily, but she hasn't been taking it daily since her Mother died several weeks ago.  She reports that she was having a hard time adjusting now that her schedule is returning back to normal.  She was taking it around 1 pm.  She reports that she is not sleeping well.  She feels like her restless legs is getting worse.  She did take gabapentin last night.  She only takes it as needed.  She was taken off of gabapentin for confusion and gait changes.  She is also not taking vitamin D either.  She reports that has been bad on her medications.  She still has been taking the zoloft.  She also feels like her sinuses are stopped up and she feels like she would like to sleep.  Her mother recently died as well.  She does tend to get sinus infections this time of the year every year.  She does have clear rhinorrhea.      Review of Systems  Constitutional: Negative for fever, chills and fatigue.  Respiratory: Negative for cough, chest tightness and shortness of breath.   Cardiovascular: Negative for chest pain, palpitations and leg swelling.  Gastrointestinal: Negative for nausea, vomiting, abdominal pain, diarrhea and constipation.  Genitourinary: Positive for frequency. Negative for dysuria, urgency and hematuria.  Neurological: Positive for headaches. Negative for dizziness, weakness, light-headedness and numbness.  Psychiatric/Behavioral: Positive for sleep disturbance, dysphoric mood and decreased concentration. Negative for self-injury. The patient is nervous/anxious.        Objective:   Physical Exam   Constitutional: She is oriented to person, place, and time. She appears well-developed and well-nourished. No distress.  HENT:  Head: Normocephalic.  Nose: Mucosal edema present. Right sinus exhibits maxillary sinus tenderness and frontal sinus tenderness. Left sinus exhibits maxillary sinus tenderness and frontal sinus tenderness.  Mouth/Throat: Oropharynx is clear and moist. No oropharyngeal exudate.  Eyes: Conjunctivae and EOM are normal. Pupils are equal, round, and reactive to light. No scleral icterus.  Neck: Normal range of motion. Neck supple. No JVD present. No thyromegaly present.  Cardiovascular: Normal rate, regular rhythm, normal heart sounds and intact distal pulses.  Exam reveals no gallop and no friction rub.   No murmur heard. Pulmonary/Chest: Effort normal and breath sounds normal. No respiratory distress. She has no wheezes. She has no rales. She exhibits no tenderness.  Abdominal: Soft. Bowel sounds are normal. She exhibits no distension and no mass. There is no tenderness. There is no rebound and no guarding.  Musculoskeletal: Normal range of motion.  Lymphadenopathy:    She has no cervical adenopathy.  Neurological: She is alert and oriented to person, place, and time.  Skin: Skin is warm and dry. She is not diaphoretic.  Psychiatric: She has a normal mood and affect. Her behavior is normal. Judgment and thought content normal.  Nursing note and vitals reviewed.   Filed Vitals:   01/31/16 1553  BP: 146/78  Pulse: 96  Temp: 98.2 F (36.8 C)  Resp: 16         Assessment & Plan:  1. Acute URI  - predniSONE (DELTASONE) 20 MG tablet; 3 tabs po day one, then 2 tabs daily x 4 days  Dispense: 11 tablet; Refill: 0 - fluticasone (FLONASE) 50 MCG/ACT nasal spray; Place 2 sprays into both nostrils daily.  Dispense: 16 g; Refill: 0 - azithromycin (ZITHROMAX Z-PAK) 250 MG tablet; 2 po day one, then 1 daily x 4 days  Dispense: 6 tablet; Refill: 0  2. Medication  management  - CBC with Differential/Platelet - BASIC METABOLIC PANEL WITH GFR - Hepatic function panel  3. Hyperlipidemia  - Lipid panel  4. Prediabetes  - Hemoglobin A1c  5. Hypothyroidism, unspecified hypothyroidism type  - TSH  6. Vitamin D deficiency  - VITAMIN D 25 Hydroxy (Vit-D Deficiency, Fractures)  7. Dysuria  - Urinalysis, Routine w reflex microscopic (not at [facility]) - Culture, Urine

## 2016-02-01 LAB — URINALYSIS, ROUTINE W REFLEX MICROSCOPIC
BILIRUBIN URINE: NEGATIVE
GLUCOSE, UA: NEGATIVE
Hgb urine dipstick: NEGATIVE
KETONES UR: NEGATIVE
Leukocytes, UA: NEGATIVE
Nitrite: NEGATIVE
Protein, ur: NEGATIVE
SPECIFIC GRAVITY, URINE: 1.009 (ref 1.001–1.035)
pH: 6 (ref 5.0–8.0)

## 2016-02-01 LAB — URINE CULTURE
Colony Count: NO GROWTH
Organism ID, Bacteria: NO GROWTH

## 2016-02-01 LAB — VITAMIN D 25 HYDROXY (VIT D DEFICIENCY, FRACTURES): Vit D, 25-Hydroxy: 10 ng/mL — ABNORMAL LOW (ref 30–100)

## 2016-02-03 ENCOUNTER — Other Ambulatory Visit: Payer: Self-pay | Admitting: Obstetrics & Gynecology

## 2016-02-03 ENCOUNTER — Encounter: Payer: Self-pay | Admitting: Internal Medicine

## 2016-02-03 DIAGNOSIS — E559 Vitamin D deficiency, unspecified: Secondary | ICD-10-CM

## 2016-02-03 NOTE — Telephone Encounter (Signed)
Pt just seen by PA and had repeat Vit D done.  It was still low at 10.  She needs to have a PTH with intact calcium, transglutaminase ab, CMP and phosphorus.  Orders were placed.

## 2016-02-05 ENCOUNTER — Telehealth: Payer: Self-pay | Admitting: Obstetrics & Gynecology

## 2016-02-05 NOTE — Telephone Encounter (Signed)
Patientt is returning a call to [place].

## 2016-02-05 NOTE — Telephone Encounter (Signed)
LM to call back.

## 2016-02-05 NOTE — Telephone Encounter (Signed)
FYI: Patient calling to let Dr.Miller know that her "mother passed away April 10th, and mother's sister passed away also."

## 2016-02-07 NOTE — Telephone Encounter (Signed)
Patient called back 02/05/16. Made appt for 02/08/16 labs and OV for BV Sx.

## 2016-02-08 ENCOUNTER — Other Ambulatory Visit: Payer: Medicare Other

## 2016-02-08 ENCOUNTER — Ambulatory Visit (INDEPENDENT_AMBULATORY_CARE_PROVIDER_SITE_OTHER): Payer: Medicare Other | Admitting: Nurse Practitioner

## 2016-02-08 ENCOUNTER — Ambulatory Visit
Admission: RE | Admit: 2016-02-08 | Discharge: 2016-02-08 | Disposition: A | Discharge status: Home / Self Care | Payer: Medicare Other | Source: Ambulatory Visit | Attending: Obstetrics & Gynecology | Admitting: Obstetrics & Gynecology

## 2016-02-08 ENCOUNTER — Encounter: Payer: Self-pay | Admitting: Nurse Practitioner

## 2016-02-08 VITALS — BP 120/60 | HR 94 | Resp 16 | Ht 66.75 in | Wt 221.0 lb

## 2016-02-08 DIAGNOSIS — N760 Acute vaginitis: Secondary | ICD-10-CM

## 2016-02-08 DIAGNOSIS — E559 Vitamin D deficiency, unspecified: Secondary | ICD-10-CM | POA: Diagnosis not present

## 2016-02-08 DIAGNOSIS — R300 Dysuria: Secondary | ICD-10-CM | POA: Diagnosis not present

## 2016-02-08 DIAGNOSIS — N63 Unspecified lump in unspecified breast: Secondary | ICD-10-CM

## 2016-02-08 LAB — COMPLETE METABOLIC PANEL WITH GFR
ALBUMIN: 4.3 g/dL (ref 3.6–5.1)
ALK PHOS: 101 U/L (ref 33–130)
ALT: 17 U/L (ref 6–29)
AST: 22 U/L (ref 10–35)
BILIRUBIN TOTAL: 0.4 mg/dL (ref 0.2–1.2)
BUN: 10 mg/dL (ref 7–25)
CALCIUM: 9.5 mg/dL (ref 8.6–10.4)
CO2: 28 mmol/L (ref 20–31)
CREATININE: 0.77 mg/dL (ref 0.50–1.05)
Chloride: 96 mmol/L — ABNORMAL LOW (ref 98–110)
Glucose, Bld: 122 mg/dL — ABNORMAL HIGH (ref 65–99)
Potassium: 4.3 mmol/L (ref 3.5–5.3)
Sodium: 132 mmol/L — ABNORMAL LOW (ref 135–146)
TOTAL PROTEIN: 6.8 g/dL (ref 6.1–8.1)

## 2016-02-08 LAB — POCT URINALYSIS DIPSTICK
BILIRUBIN UA: NEGATIVE
Blood, UA: NEGATIVE
Glucose, UA: NEGATIVE
Ketones, UA: NEGATIVE
LEUKOCYTES UA: NEGATIVE
Nitrite, UA: NEGATIVE
PH UA: 5
PROTEIN UA: NEGATIVE
Urobilinogen, UA: NEGATIVE

## 2016-02-08 LAB — PHOSPHORUS: Phosphorus: 3.6 mg/dL (ref 2.5–4.5)

## 2016-02-08 NOTE — Patient Instructions (Signed)
Bacterial Vaginosis Bacterial vaginosis is a vaginal infection that occurs when the normal balance of bacteria in the vagina is disrupted. It results from an overgrowth of certain bacteria. This is the most common vaginal infection in women of childbearing age. Treatment is important to prevent complications, especially in pregnant women, as it can cause a premature delivery. CAUSES  Bacterial vaginosis is caused by an increase in harmful bacteria that are normally present in smaller amounts in the vagina. Several different kinds of bacteria can cause bacterial vaginosis. However, the reason that the condition develops is not fully understood. RISK FACTORS Certain activities or behaviors can put you at an increased risk of developing bacterial vaginosis, including:  Having a new sex partner or multiple sex partners.  Douching.  Using an intrauterine device (IUD) for contraception. Women do not get bacterial vaginosis from toilet seats, bedding, swimming pools, or contact with objects around them. SIGNS AND SYMPTOMS  Some women with bacterial vaginosis have no signs or symptoms. Common symptoms include:  Grey vaginal discharge.  A fishlike odor with discharge, especially after sexual intercourse.  Itching or burning of the vagina and vulva.  Burning or pain with urination. DIAGNOSIS  Your health care provider will take a medical history and examine the vagina for signs of bacterial vaginosis. A sample of vaginal fluid may be taken. Your health care provider will look at this sample under a microscope to check for bacteria and abnormal cells. A vaginal pH test may also be done.  TREATMENT  Bacterial vaginosis may be treated with antibiotic medicines. These may be given in the form of a pill or a vaginal cream. A second round of antibiotics may be prescribed if the condition comes back after treatment. Because bacterial vaginosis increases your risk for sexually transmitted diseases, getting  treated can help reduce your risk for chlamydia, gonorrhea, HIV, and herpes. HOME CARE INSTRUCTIONS   Only take over-the-counter or prescription medicines as directed by your health care provider.  If antibiotic medicine was prescribed, take it as directed. Make sure you finish it even if you start to feel better.  Tell all sexual partners that you have a vaginal infection. They should see their health care provider and be treated if they have problems, such as a mild rash or itching.  During treatment, it is important that you follow these instructions:  Avoid sexual activity or use condoms correctly.  Do not douche.  Avoid alcohol as directed by your health care provider.  Avoid breastfeeding as directed by your health care provider. SEEK MEDICAL CARE IF:   Your symptoms are not improving after 3 days of treatment.  You have increased discharge or pain.  You have a fever. MAKE SURE YOU:   Understand these instructions.  Will watch your condition.  Will get help right away if you are not doing well or get worse. FOR MORE INFORMATION  Centers for Disease Control and Prevention, Division of STD Prevention: [employer]za American Sexual Health Association (ASHA): www.ashastd.org    This information is not intended to replace advice given to you by your health care provider. Make sure you discuss any questions you have with your health care provider.   Document Released: 09/29/2005 Document Revised: 10/20/2014 Document Reviewed: 05/11/2013 Elsevier Interactive Patient Education 2016 Elsevier Inc.  Atrophic Vaginitis Atrophic vaginitis is a condition in which the tissues that line the vagina become dry and thin. This condition is most common in women who have stopped having regular menstrual periods (menopause). This usually  starts when a woman is 62-52 years old. Estrogen helps to keep the vagina moist. It stimulates the vagina to produce a clear fluid that lubricates the  vagina for sexual intercourse. This fluid also protects the vagina from infection. Lack of estrogen can cause the lining of the vagina to get thinner and dryer. The vagina may also shrink in size. It may become less elastic. Atrophic vaginitis tends to get worse over time as a woman's estrogen level drops. CAUSES This condition is caused by the normal drop in estrogen that happens around the time of menopause. RISK FACTORS Certain conditions or situations may lower a woman's estrogen level, which increases her risk of atrophic vaginitis. These include:  Taking medicine that blocks estrogen.  Having ovaries removed surgically.  Being treated for cancer with X-ray treatment (radiation) or medicines (chemotherapy).  Exercising very hard and often.  Having an eating disorder (anorexia).  Giving birth or breastfeeding.  Being over the age of 75.  Smoking. SYMPTOMS Symptoms of this condition include:  Pain, soreness, or bleeding during sexual intercourse (dyspareunia).  Vaginal burning, irritation, or itching.  Pain or bleeding during a vaginal examination using a speculum (pelvic exam).  Loss of interest in sexual activity.  Having burning pain when passing urine.  Vaginal discharge that is brown or yellow. In some cases, there are no symptoms. DIAGNOSIS This condition is diagnosed with a medical history and physical exam. This will include a pelvic exam that checks whether the inside of your vagina appears pale, thin, or dry. Rarely, you may also have other tests, including:  A urine test.  A test that checks the acid balance in your vaginal fluid (acid balance test). TREATMENT Treatment for this condition may depend on the severity of your symptoms. Treatment may include:  Using an over-the-counter vaginal lubricant before you have sexual intercourse.  Using a long-acting vaginal moisturizer.  Using low-dose vaginal estrogen for moderate to severe symptoms that do not  respond to other treatments. Options include creams, tablets, and inserts (vaginal rings). Before using vaginal estrogen, tell your health care provider if you have a history of:  Breast cancer.  Endometrial cancer.  Blood clots.  Taking medicines. You may be able to take a daily pill for dyspareunia. Discuss all of the risks of this medicine with your health care provider. It is usually not recommended for women who have a family history or personal history of breast cancer. If your symptoms are very mild and you are not sexually active, you may not need treatment. HOME CARE INSTRUCTIONS  Take medicines only as directed by your health care provider. Do not use herbal or alternative medicines unless your health care provider says that you can.  Use over-the-counter creams, lubricants, or moisturizers for dryness only as directed by your health care provider.  If your atrophic vaginitis is caused by menopause, discuss all of your menopausal symptoms and treatment options with your health care provider.  Do not douche.  Do not use products that can make your vagina dry. These include:  Scented feminine sprays.  Scented tampons.  Scented soaps.  If it hurts to have sex, talk with your sexual partner. SEEK MEDICAL CARE IF:  Your discharge looks different than normal.  Your vagina has an unusual smell.  You have new symptoms.  Your symptoms do not improve with treatment.  Your symptoms get worse.   This information is not intended to replace advice given to you by your health care provider. Make sure  you discuss any questions you have with your health care provider.   Document Released: 02/13/2015 Document Reviewed: 02/13/2015 Elsevier Interactive Patient Education 2016 [employer]  Smoking Cessation, Tips for Success If you are ready to quit smoking, congratulations! You have chosen to help yourself be healthier. Cigarettes bring nicotine, tar, carbon monoxide, and other  irritants into your body. Your lungs, heart, and blood vessels will be able to work better without these poisons. There are many different ways to quit smoking. Nicotine gum, nicotine patches, a nicotine inhaler, or nicotine nasal spray can help with physical craving. Hypnosis, support groups, and medicines help break the habit of smoking. WHAT THINGS CAN I DO TO MAKE QUITTING EASIER?  Here are some tips to help you quit for good:  Pick a date when you will quit smoking completely. Tell all of your friends and family about your plan to quit on that date.  Do not try to slowly cut down on the number of cigarettes you are smoking. Pick a quit date and quit smoking completely starting on that day.  Throw away all cigarettes.   Clean and remove all ashtrays from your home, work, and car.  On a card, write down your reasons for quitting. Carry the card with you and read it when you get the urge to smoke.  Cleanse your body of nicotine. Drink enough water and fluids to keep your urine clear or pale yellow. Do this after quitting to flush the nicotine from your body.  Learn to predict your moods. Do not let a bad situation be your excuse to have a cigarette. Some situations in your life might tempt you into wanting a cigarette.  Never have "just one" cigarette. It leads to wanting another and another. Remind yourself of your decision to quit.  Change habits associated with smoking. If you smoked while driving or when feeling stressed, try other activities to replace smoking. Stand up when drinking your coffee. Brush your teeth after eating. Sit in a different chair when you read the paper. Avoid alcohol while trying to quit, and try to drink fewer caffeinated beverages. Alcohol and caffeine may urge you to smoke.  Avoid foods and drinks that can trigger a desire to smoke, such as sugary or spicy foods and alcohol.  Ask people who smoke not to smoke around you.  Have something planned to do right  after eating or having a cup of coffee. For example, plan to take a walk or exercise.  Try a relaxation exercise to calm you down and decrease your stress. Remember, you may be tense and nervous for the first 2 weeks after you quit, but this will pass.  Find new activities to keep your hands busy. Play with a pen, coin, or rubber band. Doodle or draw things on paper.  Brush your teeth right after eating. This will help cut down on the craving for the taste of tobacco after meals. You can also try mouthwash.   Use oral substitutes in place of cigarettes. Try using lemon drops, carrots, cinnamon sticks, or chewing gum. Keep them handy so they are available when you have the urge to smoke.  When you have the urge to smoke, try deep breathing.  Designate your home as a nonsmoking area.  If you are a heavy smoker, ask your health care provider about a prescription for nicotine chewing gum. It can ease your withdrawal from nicotine.  Reward yourself. Set aside the cigarette money you save and buy yourself something  nice.  Look for support from others. Join a support group or smoking cessation program. Ask someone at home or at work to help you with your plan to quit smoking.  Always ask yourself, "Do I need this cigarette or is this just a reflex?" Tell yourself, "Today, I choose not to smoke," or "I do not want to smoke." You are reminding yourself of your decision to quit.  Do not replace cigarette smoking with electronic cigarettes (commonly called e-cigarettes). The safety of e-cigarettes is unknown, and some may contain harmful chemicals.  If you relapse, do not give up! Plan ahead and think about what you will do the next time you get the urge to smoke. HOW WILL I FEEL WHEN I QUIT SMOKING? You may have symptoms of withdrawal because your body is used to nicotine (the addictive substance in cigarettes). You may crave cigarettes, be irritable, feel very hungry, cough often, get headaches, or  have difficulty concentrating. The withdrawal symptoms are only temporary. They are strongest when you first quit but will go away within 10-14 days. When withdrawal symptoms occur, stay in control. Think about your reasons for quitting. Remind yourself that these are signs that your body is healing and getting used to being without cigarettes. Remember that withdrawal symptoms are easier to treat than the major diseases that smoking can cause.  Even after the withdrawal is over, expect periodic urges to smoke. However, these cravings are generally short lived and will go away whether you smoke or not. Do not smoke! WHAT RESOURCES ARE AVAILABLE TO HELP ME QUIT SMOKING? Your health care provider can direct you to community resources or hospitals for support, which may include:  Group support.  Education.  Hypnosis.  Therapy.   This information is not intended to replace advice given to you by your health care provider. Make sure you discuss any questions you have with your health care provider.   Document Released: 06/27/2004 Document Revised: 10/20/2014 Document Reviewed: 03/17/2013 Elsevier Interactive Patient Education [employer]

## 2016-02-08 NOTE — Progress Notes (Signed)
50 y.o. Single Caucasian female G1P1 here with complaint of vaginal symptoms of vaginal odor. Describes discharge as none. States her pap 08/2015 showed BV and she was treated with Metrogel.  Seemed as odor was some better but has come back on and off. Onset of symptoms  2-3 days ago. Denies new personal products or vaginal dryness. No STD concerns. Urinary symptoms none . Contraception is postmenopausal.  Not SA in 23 yrs. Mother passed 01/30/16 and maternal aunt died 14 hour later.  O:  Healthy female WDWN Affect: normal, orientation x 3  Exam: no distress, other than some emotional issues Abdomen: soft and non tender Lymph node: no enlargement or tenderness Pelvic exam: External genital: normal female BUS: negative Vagina: very little discharge noted.  Affirm taken.  A: Vaginitis  Most likely atrophic vaginitis  Recent BV on pap 08/2015  Grief reaction from mother's death 01/21/2016, MA death 01/22/2016  Smoker    P: Discussed findings of vaginitis and etiology. Discussed Aveeno or baking soda sitz bath for comfort. Avoid moist clothes or pads for extended period of time. If working out in gym clothes or swim suits for long periods of time change underwear or bottoms of swimsuit if possible. Olive Oil/Coconut Oil use for skin protection prior to activity can be used to external skin.  Rx: none at present  Follow with [facility]  Information about atrophic vaginitis, BV  Information about smoking  RV prn  She is also here for some labs ordered by Dr. Hyacinth MeekerMiller Wants Dr. Hyacinth MeekerMiller to be aware of mother's passing. - will send staff message

## 2016-02-09 LAB — WET PREP BY MOLECULAR PROBE
Candida species: NEGATIVE
Gardnerella vaginalis: POSITIVE — AB
Trichomonas vaginosis: NEGATIVE

## 2016-02-09 NOTE — Progress Notes (Signed)
Encounter reviewed by Dr. Nakeysha Pasqual Amundson C. Silva.  

## 2016-02-10 ENCOUNTER — Other Ambulatory Visit: Payer: Self-pay | Admitting: Nurse Practitioner

## 2016-02-10 MED ORDER — METRONIDAZOLE 0.75 % VA GEL: 1.0000 | Freq: Every day | VAGINAL | Status: DC

## 2016-02-11 LAB — PTH, INTACT AND CALCIUM
CALCIUM: 9.5 mg/dL (ref 8.4–10.5)
PTH: 55 pg/mL (ref 14–64)

## 2016-02-11 LAB — TISSUE TRANSGLUTAMINASE, IGG: TISSUE TRANSGLUT AB: 1 U/mL (ref <6)

## 2016-02-15 ENCOUNTER — Ambulatory Visit: Payer: Medicare Other | Admitting: Nurse Practitioner

## 2016-05-06 ENCOUNTER — Ambulatory Visit: Payer: Self-pay | Admitting: Physician Assistant

## 2016-05-06 NOTE — Progress Notes (Deleted)
MEDICARE ANNUAL WELLNESS VISIT AND CPE  Assessment:     Over 40 minutes of exam, counseling, chart review and critical decision making was performed  Plan:   During the course of the visit the patient was educated and counseled about appropriate screening and preventive services including:    Pneumococcal vaccine   Influenza vaccine  Td vaccine  Screening electrocardiogram  Bone densitometry screening  Colorectal cancer screening  Diabetes screening  Glaucoma screening  Nutrition counseling   Advanced directives: requested  Subjective:  Susan Bartlett is a 50 y.o. female who presents for Medicare Annual Wellness Visit and follow up for HTN, chol, vitamin D def.    Her blood pressure has been controlled at home, today their BP is   She does not workout. She denies chest pain, shortness of breath, dizziness.  She is on cholesterol medication, pravastatin 40 and denies myalgias. Her cholesterol is not at goal. The cholesterol last visit was:   Lab Results  Component Value Date   CHOL 265 (H) 01/31/2016   HDL 33 (L) 01/31/2016   LDLCALC NOT CALC 01/31/2016   TRIG 626 (H) 01/31/2016   CHOLHDL 8.0 (H) 01/31/2016  Last A1C in the office was:  Lab Results  Component Value Date   HGBA1C 5.4 01/31/2016  Patient is on Vitamin D supplement.   Lab Results  Component Value Date   VD25OH 10 (L) 01/31/2016  Mother, Susan Bartlett passed April 2017.  She has had headaches since 50 years old, but they have been worse the last 1-2 months, frontal HA's.  Wears night guard for TMJ.  She is on thyroid medication. Her medication was not changed last visit.   Lab Results  Component Value Date   TSH 3.42 01/31/2016  She is on gabapentin for RLS.  She is on klonopin for panic attacks/anxiety, and she is on zoloft for depression, follows with Dr. Richardean Sale.    Medication Review: Current Outpatient Prescriptions on File Prior to Visit  Medication Sig Dispense Refill  . clonazePAM  (KLONOPIN) 1 MG tablet Take 2 mg by mouth 3 (three) times daily. Take 1/2 by mouth every am, 1/2 at lunch, and 1 every night    . fluticasone (FLONASE) 50 MCG/ACT nasal spray Place 2 sprays into both nostrils daily. 16 g 0  . gabapentin (NEURONTIN) 100 MG capsule Take 100 mg by mouth as needed.     Marland Kitchen ibuprofen (ADVIL,MOTRIN) 200 MG tablet Take 200 mg by mouth every 6 (six) hours as needed.    Marland Kitchen levothyroxine (SYNTHROID, LEVOTHROID) 75 MCG tablet Take 1 tablet (75 mcg total) by mouth daily. 90 tablet 1  . metroNIDAZOLE (METROGEL) 0.75 % vaginal gel Place 1 Applicatorful vaginally at bedtime. 70 g 0  . pravastatin (PRAVACHOL) 40 MG tablet Take 1 tablet (40 mg total) by mouth daily. 90 tablet 3  . sertraline (ZOLOFT) 100 MG tablet Take 200 mg by mouth 2 (two) times daily.     . Vitamin D, Ergocalciferol, (DRISDOL) 50000 units CAPS capsule Take 50,000 Units by mouth 2 (two) times a week.     No current facility-administered medications on file prior to visit.     Current Problems (verified) Patient Active Problem List   Diagnosis Date Noted  . Tobacco use disorder 01/16/2015  . Prediabetes 01/16/2015  . TMJ (dislocation of temporomandibular joint) 01/16/2015  . Vitamin D deficiency   . Hepatomegaly   . Mixed hyperlipidemia   . Panic disorder   . Anal fissure   .  Depression, major, in remission (HCC)   . Hypothyroidism   . Obesity     Screening Tests Immunization History  Administered Date(s) Administered  . Influenza Split 08/01/2013  . Td 11/18/2012   Preventative care: Tetanus: 11/2012 Pneumovax: N/A Prevnar 13: N/A Flu vaccine: 07/2014 Zostavax: N/A  Pap:  03/2013- Dr. Hyacinth Meeker, history of ASCUS MGM: May/ 2015 [place] June 2015 on Right breast, will get another DEXA: 2008, heel, neg Colonoscopy: 11/2014 due 10 years EGD: N/A PFT: 01/2013 Stress test: 12/2012 [facility]) Echo: 12/2012  Names of Other Physician/Practitioners you currently use: 1. [facility] for [facility] 2.  [facility], eye doctor, last visit 1999 3. Dr. Lequita Asal , dentist, last visit 6 years Patient Care Team: Lucky Cowboy, MD as PCP - General (Internal Medicine) Wendall Stade, MD as Consulting Physician (Cardiology) Iva Boop, MD as Consulting Physician (Gastroenterology) Jerene Bears, MD as Consulting Physician (Gynecology)  Allergies No Known Allergies  SURGICAL HISTORY She  has a past surgical history that includes Colonoscopy (03/25/2007); Hemorroidectomy; Cholecystectomy; Left oophorectomy; and Tonsillectomy. FAMILY HISTORY Her family history includes Alcohol abuse in her sister; Breast cancer in her mother; Cirrhosis in her father; Colon cancer (age of onset: 59) in her maternal grandmother; Diabetes in her mother; Drug abuse in her brother; Heart disease in her father. SOCIAL HISTORY She  reports that she has been smoking Cigarettes.  She has been smoking about 1.00 pack per day. She has never used smokeless tobacco. She reports that she does not drink alcohol or use drugs.  MEDICARE WELLNESS OBJECTIVES: Tobacco use: She does smoke.  Patient counseled on smoking cessation.  Alcohol Current alcohol use: none Diet: in general, an "unhealthy" diet Physical activity: none right now, wants to start Depression/mood screen:   Depression screen [facility] 2/9 01/16/2015  Decreased Interest 1  Down, Depressed, Hopeless 1  PHQ - 2 Score 2  Altered sleeping 1  Tired, decreased energy 1  Change in appetite 1  Feeling bad or failure about yourself  1  Trouble concentrating 1  Moving slowly or fidgety/restless 0  Suicidal thoughts 0  PHQ-9 Score 7  Difficult doing work/chores Not difficult at all   ADLs:  No flowsheet data found.  Fall risk: Low Risk Cognitive Testing  Alert? Yes  Normal Appearance?Yes  Oriented to person? Yes  Place? Yes   Time? Yes  Recall of three objects?  Yes  Can perform simple calculations?  Yes  Displays appropriate judgment?Yes  Can read the correct time from a watch face?Yes EOL planning:       Objective:     Last menstrual period 08/04/2012. There is no height or weight on file to calculate BMI.  General appearance: alert, no distress, WD/WN, female HEENT: normocephalic, sclerae anicteric, TMs pearly, nares patent, no discharge or erythema, pharynx normal, + TMJ tenderness bilateral.  Oral cavity: MMM, no lesions Neck: supple, no lymphadenopathy, no thyromegaly, no masses Heart: RRR, normal S1, S2, no murmurs Lungs: CTA bilaterally, no wheezes, rhonchi, or rales Abdomen: +bs, soft, non tender, non distended, no masses, no hepatomegaly, no splenomegaly Musculoskeletal: nontender, no swelling, no obvious deformity Extremities: no edema, no cyanosis, no clubbing Pulses: 2+ symmetric, upper and lower extremities, normal cap refill Neurological: alert, oriented x 3, CN2-12 intact, strength normal upper extremities and lower extremities, sensation normal throughout, DTRs 2+ throughout, no cerebellar signs, antalgic gait Psychiatric: normal affect, behavior normal, pleasant   Medicare Attestation I have personally  reviewed: The patient's medical and social history Their use of alcohol, tobacco or illicit drugs Their current medications and supplements The patient's functional ability including ADLs,fall risks, home safety risks, cognitive, and hearing and visual impairment Diet and physical activities Evidence for depression or mood disorders  The patient's weight, height, BMI, and visual acuity have been recorded in the chart.  I have made referrals, counseling, and provided education to the patient based on review of the above and I have provided the patient with a written personalized care plan for preventive services.     Quentin Mulling, PA-C   05/06/2016

## 2016-05-26 ENCOUNTER — Other Ambulatory Visit: Payer: Self-pay | Admitting: Internal Medicine

## 2016-07-08 ENCOUNTER — Encounter: Payer: Self-pay | Admitting: Internal Medicine

## 2016-07-09 ENCOUNTER — Encounter: Payer: Self-pay | Admitting: Internal Medicine

## 2016-07-09 ENCOUNTER — Ambulatory Visit (INDEPENDENT_AMBULATORY_CARE_PROVIDER_SITE_OTHER): Payer: Medicare Other | Admitting: Internal Medicine

## 2016-07-09 VITALS — BP 122/76 | HR 80 | Temp 97.5°F | Resp 16 | Ht 66.75 in | Wt 214.4 lb

## 2016-07-09 DIAGNOSIS — E785 Hyperlipidemia, unspecified: Secondary | ICD-10-CM | POA: Diagnosis not present

## 2016-07-09 DIAGNOSIS — IMO0001 Reserved for inherently not codable concepts without codable children: Secondary | ICD-10-CM

## 2016-07-09 DIAGNOSIS — E039 Hypothyroidism, unspecified: Secondary | ICD-10-CM | POA: Diagnosis not present

## 2016-07-09 DIAGNOSIS — E559 Vitamin D deficiency, unspecified: Secondary | ICD-10-CM | POA: Diagnosis not present

## 2016-07-09 DIAGNOSIS — R7303 Prediabetes: Secondary | ICD-10-CM | POA: Diagnosis not present

## 2016-07-09 DIAGNOSIS — Z79899 Other long term (current) drug therapy: Secondary | ICD-10-CM | POA: Diagnosis not present

## 2016-07-09 DIAGNOSIS — M2550 Pain in unspecified joint: Secondary | ICD-10-CM

## 2016-07-09 DIAGNOSIS — R030 Elevated blood-pressure reading, without diagnosis of hypertension: Secondary | ICD-10-CM

## 2016-07-09 LAB — LIPID PANEL
Cholesterol: 177 mg/dL (ref 125–200)
HDL: 42 mg/dL — AB (ref >=46)
LDL CALC: 103 mg/dL (ref <130)
Total CHOL/HDL Ratio: 4.2 Ratio (ref <=5.0)
Triglycerides: 161 mg/dL — ABNORMAL HIGH (ref <150)
VLDL: 32 mg/dL — ABNORMAL HIGH (ref <30)

## 2016-07-09 LAB — BASIC METABOLIC PANEL WITH GFR
BUN: 8 mg/dL (ref 7–25)
CHLORIDE: 100 mmol/L (ref 98–110)
CO2: 26 mmol/L (ref 20–31)
CREATININE: 0.67 mg/dL (ref 0.50–1.05)
Calcium: 9.5 mg/dL (ref 8.6–10.4)
GFR, Est African American: >89 mL/min (ref >=60)
GFR, Est Non African American: >89 mL/min (ref >=60)
GLUCOSE: 78 mg/dL (ref 65–99)
POTASSIUM: 4.4 mmol/L (ref 3.5–5.3)
Sodium: 135 mmol/L (ref 135–146)

## 2016-07-09 LAB — CBC WITH DIFFERENTIAL/PLATELET
BASOS ABS: 65 {cells}/uL (ref 0–200)
BASOS PCT: 1 %
EOS ABS: 585 {cells}/uL — AB (ref 15–500)
Eosinophils Relative: 9 %
HCT: 42.5 % (ref 35.0–45.0)
HEMOGLOBIN: 14.1 g/dL (ref 11.7–15.5)
LYMPHS ABS: 1625 {cells}/uL (ref 850–3900)
Lymphocytes Relative: 25 %
MCH: 30.1 pg (ref 27.0–33.0)
MCHC: 33.2 g/dL (ref 32.0–36.0)
MCV: 90.6 fL (ref 80.0–100.0)
MONO ABS: 520 {cells}/uL (ref 200–950)
MONOS PCT: 8 %
MPV: 10.4 fL (ref 7.5–12.5)
Neutro Abs: 3705 cells/uL (ref 1500–7800)
Neutrophils Relative %: 57 %
Platelets: 265 10*3/uL (ref 140–400)
RBC: 4.69 MIL/uL (ref 3.80–5.10)
RDW: 14.1 % (ref 11.0–15.0)
WBC: 6.5 10*3/uL (ref 3.8–10.8)

## 2016-07-09 LAB — HEPATIC FUNCTION PANEL
ALBUMIN: 4.4 g/dL (ref 3.6–5.1)
ALK PHOS: 78 U/L (ref 33–130)
ALT: 16 U/L (ref 6–29)
AST: 18 U/L (ref 10–35)
BILIRUBIN TOTAL: 0.3 mg/dL (ref 0.2–1.2)
Bilirubin, Direct: 0.1 mg/dL (ref <=0.2)
Indirect Bilirubin: 0.2 mg/dL (ref 0.2–1.2)
TOTAL PROTEIN: 6.6 g/dL (ref 6.1–8.1)

## 2016-07-09 LAB — MAGNESIUM: MAGNESIUM: 1.7 mg/dL (ref 1.5–2.5)

## 2016-07-09 LAB — TSH: TSH: 3.22 mIU/L

## 2016-07-09 MED ORDER — PREDNISONE 20 MG PO TABS
ORAL_TABLET | ORAL | 0 refills | Status: DC
Start: 2016-07-09 — End: 2016-08-26

## 2016-07-09 NOTE — Patient Instructions (Signed)

## 2016-07-09 NOTE — Progress Notes (Signed)
[facility] [name], M.D.        Dyanne CarrelAmanda R. Steffanie Dunnollier, P.A.-C       [name], P.A.-C  [facility]                [address]                [place], [place] [id number] Telephone [phone]) [phone] Telefax [phone] ______________________________________________________________________     This very nice 50 y.o. Single WF presents for 3 month follow up with Hypertension, Hyperlipidemia, Pre-Diabetes and Vitamin D Deficiency. Also note patient is on SS Disability for Depression & panic disorder. She apparently becomes very anxious even thinking about trying to return to work.      Patient has labile HTN followed expectantly circa 2008 & BP has been controlled at home. Today's BP is 122/76. Patient has had no complaints of any cardiac type chest pain, palpitations, dyspnea/orthopnea/PND, dizziness, claudication, or dependent edema.     Hyperlipidemia is not controlled with diet & her compliance with Pravastatin is suspect.  Patient denies myalgias or other med SE's. Last Lipids were not at goal: Lab Results  Component Value Date   CHOL 265 (H) 01/31/2016   HDL 33 (L) 01/31/2016   LDLCALC NOT CALC 01/31/2016   TRIG 626 (H) 01/31/2016   CHOLHDL 8.0 (H) 01/31/2016      Also, the patient has history of Morbid Obesity (BMI 33+) and is followed expectantly for PreDiabetes and she has had no symptoms of reactive hypoglycemia, diabetic polys, paresthesias or visual blurring.  Last A1c was at goal: Lab Results  Component Value Date   HGBA1C 5.4 01/31/2016      Patient has been on Thyroid replacement since 2012. Further, the patient also has history of Vitamin D Deficiency and does not supplement Vit D as recommended. Last vitamin D was still very low: Lab Results  Component Value Date   VD25OH 10 (L) 01/31/2016   Current Outpatient Prescriptions on File Prior to Visit  Medication Sig  . clonazePAM (KLONOPIN) 1 MG  tablet Take 2 mg by mouth 3 (three) times daily. Take 1/2 by mouth every am, 1/2 at lunch, and 1 every night  . ibuprofen (ADVIL,MOTRIN) 200 MG tablet Take 200 mg by mouth every 6 (six) hours as needed.  Marland Kitchen. levothyroxine (SYNTHROID, LEVOTHROID) 75 MCG tablet Take 1 tablet (75 mcg total) by mouth daily.  . metroNIDAZOLE (METROGEL) 0.75 % vaginal gel Place 1 Applicatorful vaginally at bedtime.  . pravastatin (PRAVACHOL) 40 MG tablet Take 1 tablet (40 mg total) by mouth daily.  . pravastatin (PRAVACHOL) 40 MG tablet TAKE ONE TABLET BY MOUTH ONCE DAILY  . sertraline (ZOLOFT) 100 MG tablet Take 200 mg by mouth 2 (two) times daily.   . Vitamin D, Ergocalciferol, (DRISDOL) 50000 units CAPS capsule Take 50,000 Units by mouth 2 (two) times a week.   No current facility-administered medications on file prior to visit.    No Known Allergies PMHx:   Past Medical History:  Diagnosis Date  . Anal fissure 1994  . Anxiety   . Depression   . Endometriosis   . Fracture 08/2011   fracture left wrist and elbow  . Hemorrhoids 2008  . Hepatomegaly   . Hypothyroidism   . Mixed hyperlipidemia   . Obesity   . OCD (obsessive compulsive disorder)   . Panic disorder   . Plantar fasciitis, right 03/2014  . RLS (restless legs syndrome) 2014  vis sleep study- no sleep apnea  . Tobacco abuse    not tolerated Chantix in the past  . Vitamin D deficiency    Immunization History  Administered Date(s) Administered  . Influenza Split 08/01/2013  . Td 11/18/2012   Past Surgical History:  Procedure Laterality Date  . CHOLECYSTECTOMY    . COLONOSCOPY  03/25/2007   small external hemorroids  . HEMORROIDECTOMY    . LEFT OOPHORECTOMY    . TONSILLECTOMY     FHx:    Reviewed / unchanged  SHx:    Reviewed / unchanged  Systems Review:  Constitutional: Denies fever, chills, wt changes, headaches, insomnia, fatigue, night sweats, change in appetite. Eyes: Denies redness, blurred vision, diplopia, discharge,  itchy, watery eyes.  ENT: Denies discharge, congestion, post nasal drip, epistaxis, sore throat, earache, hearing loss, dental pain, tinnitus, vertigo, sinus pain, snoring.  CV: Denies chest pain, palpitations, irregular heartbeat, syncope, dyspnea, diaphoresis, orthopnea, PND, claudication or edema. Respiratory: denies cough, dyspnea, DOE, pleurisy, hoarseness, laryngitis, wheezing.  Gastrointestinal: Denies dysphagia, odynophagia, heartburn, reflux, water brash, abdominal pain or cramps, nausea, vomiting, bloating, diarrhea, constipation, hematemesis, melena, hematochezia  or hemorrhoids. Genitourinary: Denies dysuria, frequency, urgency, nocturia, hesitancy, discharge, hematuria or flank pain. Musculoskeletal: Denies myalgias, stiffness, jt. swelling, pain, limping or strain/sprain. Today she's c/o arthralgias of the knees & wrists.  Skin: Denies pruritus, rash, hives, warts, acne, eczema or change in skin lesion(s). Neuro: No weakness, tremor, incoordination, spasms, paresthesia or pain. Psychiatric: Denies confusion, memory loss or sensory loss. Endo: Denies change in weight, skin or hair change.  Heme/Lymph: No excessive bleeding, bruising or enlarged lymph nodes.  Physical Exam BP 122/76   Pulse 80   Temp 97.5 F (36.4 C)   Resp 16   Ht 5' 6.75" (1.695 m)   Wt 214 lb 6.4 oz (97.3 kg)   LMP 08/04/2012   BMI 33.83 kg/m   Appears well nourished and in no distress.  Eyes: PERRLA, EOMs, conjunctiva no swelling or erythema. Sinuses: No frontal/maxillary tenderness ENT/Mouth: EAC's clear, TM's nl w/o erythema, bulging. Nares clear w/o erythema, swelling, exudates. Oropharynx clear without erythema or exudates. Oral hygiene is good. Tongue normal, non obstructing. Hearing intact.  Neck: Supple. Thyroid nl. Car 2+/2+ without bruits, nodes or JVD. Chest: Respirations nl with BS clear & equal w/o rales, rhonchi, wheezing or stridor.  Cor: Heart sounds normal w/ regular rate and rhythm  without sig. murmurs, gallops, clicks, or rubs. Peripheral pulses normal and equal  without edema.  Abdomen: Soft & bowel sounds normal. Non-tender w/o guarding, rebound, hernias, masses, or organomegaly.  Lymphatics: Unremarkable.  Musculoskeletal: Full ROM all peripheral extremities, joint stability, 5/5 strength, and normal gait. No signs of synovitis.  Skin: Warm, dry without exposed rashes, lesions or ecchymosis apparent.  Neuro: Cranial nerves intact, reflexes equal bilaterally. Sensory-motor testing grossly intact. Tendon reflexes grossly intact.  Pysch: Alert & oriented x 3.  Insight and judgement nl & appropriate. No ideations.  Assessment and Plan:  1. Elevated BP  - Continue medication, monitor blood pressure at home. Continue DASH diet. Reminder to go to the ER if any CP, SOB, nausea, dizziness, severe HA, changes vision/speech, left arm numbness and tingling and jaw pain. - TSH  2. Hyperlipidemia  - Continue diet/meds, exercise,& lifestyle modifications. Continue monitor periodic cholesterol/liver & renal functions   - Lipid panel - TSH  3. Prediabetes  - Continue diet, exercise, lifestyle modifications. Monitor appropriate labs. - Hemoglobin A1c - Insulin, random  4. Vitamin D  deficiency  - VITA - Continue supplementation.MIN D 25 Hydroxy   5. Hypothyroidism   6. Medication management  - CBC with Differential/Platelet - BASIC METABOLIC PANEL WITH GFR - Hepatic function panel - Magnesium  7. Arthralgia  - predniSONE (DELTASONE) 20 MG tablet; 1 tab 3 x day for 3 days, then 1 tab 2 x day for 3 days, then 1 tab 1 x day for 5 days  Dispense: 20 tablet; Refill: 0       Recommended regular exercise, BP monitoring, weight control, and discussed med and SE's. Recommended labs to assess and monitor clinical status. Further disposition pending results of labs. Over 30 minutes of exam, counseling, chart review was performed

## 2016-07-10 LAB — INSULIN, RANDOM: INSULIN: 8.4 u[IU]/mL (ref 2.0–19.6)

## 2016-07-10 LAB — VITAMIN D 25 HYDROXY (VIT D DEFICIENCY, FRACTURES): VIT D 25 HYDROXY: 15 ng/mL — AB (ref 30–100)

## 2016-07-10 LAB — HEMOGLOBIN A1C
HEMOGLOBIN A1C: 5.2 % (ref <5.7)
Mean Plasma Glucose: 103 mg/dL

## 2016-08-14 ENCOUNTER — Encounter: Payer: Self-pay | Admitting: Internal Medicine

## 2016-08-18 DIAGNOSIS — M25552 Pain in left hip: Secondary | ICD-10-CM | POA: Diagnosis not present

## 2016-08-18 DIAGNOSIS — M25551 Pain in right hip: Secondary | ICD-10-CM | POA: Diagnosis not present

## 2016-08-18 DIAGNOSIS — M545 Low back pain: Secondary | ICD-10-CM | POA: Diagnosis not present

## 2016-08-20 DIAGNOSIS — M5441 Lumbago with sciatica, right side: Secondary | ICD-10-CM | POA: Diagnosis not present

## 2016-08-20 DIAGNOSIS — M5442 Lumbago with sciatica, left side: Secondary | ICD-10-CM | POA: Diagnosis not present

## 2016-08-26 ENCOUNTER — Encounter: Payer: Self-pay | Admitting: Internal Medicine

## 2016-08-26 ENCOUNTER — Ambulatory Visit (INDEPENDENT_AMBULATORY_CARE_PROVIDER_SITE_OTHER): Payer: Medicare Other | Admitting: Internal Medicine

## 2016-08-26 VITALS — BP 124/62 | HR 94 | Temp 98.2°F | Resp 18 | Ht 66.75 in | Wt 218.0 lb

## 2016-08-26 DIAGNOSIS — H8113 Benign paroxysmal vertigo, bilateral: Secondary | ICD-10-CM

## 2016-08-26 DIAGNOSIS — H6591 Unspecified nonsuppurative otitis media, right ear: Secondary | ICD-10-CM

## 2016-08-26 MED ORDER — PREDNISONE 20 MG PO TABS: ORAL_TABLET | ORAL | 0 refills | Status: DC

## 2016-08-26 MED ORDER — FLUTICASONE PROPIONATE 50 MCG/ACT NA SUSP: 2.0000 | Freq: Every day | NASAL | 0 refills | Status: DC

## 2016-08-26 MED ORDER — MECLIZINE HCL 25 MG PO TABS: 25.0000 mg | ORAL_TABLET | Freq: Three times a day (TID) | ORAL | 0 refills | Status: DC | PRN

## 2016-08-26 NOTE — Progress Notes (Signed)
HPI  Patient presents to the office for evaluation of right ear fullness and pain.  It has been going on for 3 weeks.  It has worsened in the last 2 days.  She notes that she has tried some positional movement.  She has had some vertigo like symptoms with moving around in her ear.  She had started gabapentin recently and also has started abilify. Patient reports some mild dry throat clearing cough.  They also endorse change in voice, postnasal drip and nasal congestion, tickle in the throat, right ear fullness, vertigo.  NO fevers, chills..  They have tried nasal saline and netti pot spray.  They report that nothing has worked.  They denies other sick contacts.  Review of Systems  Constitutional: Positive for malaise/fatigue. Negative for chills and fever.  HENT: Positive for congestion, ear pain, hearing loss and sore throat.   Respiratory: Positive for cough. Negative for sputum production, shortness of breath and wheezing.   Cardiovascular: Negative for chest pain, palpitations and leg swelling.  Neurological: Positive for headaches.    PE:  Vitals:   08/26/16 1455  BP: 124/62  Pulse: 94  Resp: 18  Temp: 98.2 F (36.8 C)    General:  Alert and non-toxic, WDWN, NAD HEENT: NCAT, PERLA, EOM normal, no occular discharge or erythema.  Nasal mucosal edema with sinus tenderness to palpation.  Oropharynx clear with minimal oropharyngeal edema and erythema.  Mucous membranes moist and pink. Neck:  Cervical adenopathy Chest:  RRR no MRGs.  Lungs clear to auscultation A&P with no wheezes rhonchi or rales.   Abdomen: +BS x 4 quadrants, soft, non-tender, no guarding, rigidity, or rebound. Skin: warm and dry no rash Neuro: A&Ox4, CN II-XII grossly intact  Assessment and Plan:   1. Fluid level behind tympanic membrane of right ear -flonase -meclizine -daily antihistamine -prednisone -nasal saline/netti pot -neuro exam is normal  2. Benign paroxysmal positional vertigo due to bilateral  vestibular disorder

## 2016-08-26 NOTE — Patient Instructions (Signed)
Please start taking a generic version of either claritin or allegra once daily.  Please start using flonase 2 sprays per nostril right before bedtime.  Please take the prednisone in the morning with breakfast.  Take all the pills at the same time.  You do not need to separate these.  Please take the meclizine up to 3 times a day as needed for vertigo symptoms.  Please continue to rinse your sinuses out.    It is safe to take abilify now so you can restart this.  For now you can take a break from the Gabapentin.     Otitis Media, Adult Otitis media is redness, soreness, and inflammation of the middle ear. Otitis media may be caused by allergies or, most commonly, by infection. Often it occurs as a complication of the common cold. What are the signs or symptoms? Symptoms of otitis media may include:  Earache.  Fever.  Ringing in your ear.  Headache.  Leakage of fluid from the ear. How is this diagnosed? To diagnose otitis media, your health care provider will examine your ear with an otoscope. This is an instrument that allows your health care provider to see into your ear in order to examine your eardrum. Your health care provider also will ask you questions about your symptoms. How is this treated? Typically, otitis media resolves on its own within 3-5 days. Your health care provider may prescribe medicine to ease your symptoms of pain. If otitis media does not resolve within 5 days or is recurrent, your health care provider may prescribe antibiotic medicines if he or she suspects that a bacterial infection is the cause. Follow these instructions at home:  If you were prescribed an antibiotic medicine, finish it all even if you start to feel better.  Take medicines only as directed by your health care provider.  Keep all follow-up visits as directed by your health care provider. Contact a health care provider if:  You have otitis media only in one ear, or bleeding from your  nose, or both.  You notice a lump on your neck.  You are not getting better in 3-5 days.  You feel worse instead of better. Get help right away if:  You have pain that is not controlled with medicine.  You have swelling, redness, or pain around your ear or stiffness in your neck.  You notice that part of your face is paralyzed.  You notice that the bone behind your ear (mastoid) is tender when you touch it. This information is not intended to replace advice given to you by your health care provider. Make sure you discuss any questions you have with your health care provider. Document Released: 07/04/2004 Document Revised: 03/06/2016 Document Reviewed: 04/26/2013 Elsevier Interactive Patient Education  2017 [employer]

## 2016-08-28 ENCOUNTER — Encounter: Payer: Self-pay | Admitting: Internal Medicine

## 2016-08-28 ENCOUNTER — Other Ambulatory Visit: Payer: Self-pay | Admitting: Internal Medicine

## 2016-09-02 ENCOUNTER — Ambulatory Visit (INDEPENDENT_AMBULATORY_CARE_PROVIDER_SITE_OTHER): Payer: Medicare Other | Admitting: Internal Medicine

## 2016-09-02 ENCOUNTER — Encounter: Payer: Self-pay | Admitting: Internal Medicine

## 2016-09-02 VITALS — BP 136/82 | HR 90 | Temp 98.4°F | Resp 18 | Ht 66.75 in | Wt 220.0 lb

## 2016-09-02 DIAGNOSIS — M26629 Arthralgia of temporomandibular joint, unspecified side: Secondary | ICD-10-CM | POA: Diagnosis not present

## 2016-09-02 DIAGNOSIS — H6590 Unspecified nonsuppurative otitis media, unspecified ear: Secondary | ICD-10-CM | POA: Diagnosis not present

## 2016-09-02 NOTE — Progress Notes (Signed)
[facility]   Lucky CowboyWilliam Shedrick Sarli, M.D.    Dyanne CarrelAmanda R. Steffanie Dunnollier, P.A.-C      [name], P.A.-C  [facility]                [address]                [place], [place] [id number] Telephone [phone] Telefax [phone]) [phone] Subjective:    Patient ID: Buren KosKaren L Clugston, female    DOB: [birth year], 50 y.o.   MRN: [id number]  HPI  This very nice 50 yo single WF with HTN, HLD, PreDM, Vit D Def, Bipolar Disorder and panic disorder was seen 1 week ago with ear pains, dizziness and was treated with a prednisone taper. Over thew last several days she reported BP's had been elevate as well as pulse rate which she attributed to the prednisone and hence stopped. Today she continues to c/o dizziness or a sensation of imbalance, full sensation in the R ear as well as R ear discomfort, congestion in her chest (no sputum). She has withheld her Abilify w/o change in her dizziness and was advised that she could resume taking it.   Medication Sig  . clonazePAM 1 MG tablet Take 1/2  every am, 1/2 at lunch, and 1 every night  . FLONASE nasal spray Place 2 sprays into both nostrils daily.  Marland Kitchen. ibuprofen  200 MG tablet Take 200 mg by mouth every 6 (six) hours as needed.  Marland Kitchen. levothyroxine  75 MCG tablet Take 1 tablet (75 mcg total) by mouth daily.  . meclizine  25 MG tablet Take 1 tab 3  times daily as needed for dizziness.  . pravastatin  40 MG tablet TAKE ONE TABLET BY MOUTH ONCE DAILY  . sertraline  100 MG tablet Take 200 mg by mouth 2 (two) times daily.   . ABILIFY 5 MG tablet   . methocarbamol  500 MG tablet   . Vitamin D 50,000 units  Take 50,000 Units by mouth 2 (two) times a week.  . meloxicam  15 MG tablet    No Known Allergies   Past Medical History:  Diagnosis Date  . Anal fissure 1994  . Anxiety   . Depression   . Endometriosis   . Fracture 08/2011   fracture left wrist and elbow  . Hemorrhoids 2008  . Hepatomegaly   .  Hypothyroidism   . Mixed hyperlipidemia   . Obesity   . OCD (obsessive compulsive disorder)   . Panic disorder   . Plantar fasciitis, right 03/2014  . RLS (restless legs syndrome) 2014   vis sleep study- no sleep apnea  . Tobacco abuse    not tolerated Chantix in the past  . Vitamin D deficiency    Review of Systems  10 point systems review negative except as above.    Objective:   Physical Exam  BP 136/82   Pulse 90   Temp 98.4 F (36.9 C) (Temporal)   Resp 18   Ht 5' 6.75" (1.695 m)   Wt 220 lb (99.8 kg)   LMP 08/04/2012   SpO2 98%   BMI 34.72 kg/m   HEENT - Bilat tender TM jt's. EAC's patent. TM's ? Sl retracted. EOM's full. PERRLA. NasoOroPharynx clear. Neck - supple. Nl Thyroid. Carotids 2+ & No bruits, nodes, JVD Chest - Clear equal BS w/o Rales, rhonchi, wheezes. Cor - Nl HS. RRR w/o sig MGR. PP 1(+).  MS- FROM w/o deformities.  Muscle power, tone and bulk Nl. Gait Nl. Neuro - No obvious Cr N abnormalities. Sensory, motor and Cerebellar functions appear Nl w/o focal abnormalities.    Assessment & Plan:   1. Arthralgia of temporomandibular joint, unspecified laterality  - Recc Heat and fill the Meloxicam 15 mg Rx previously given to her.  (hold Ibuprofen if on Meloxicam)   - Recc trial with heating pad.   2. Serous otitis media, unspecified chronicity, unspecified laterality  - recc trial with OTC store brand Sudafed-PE (phenylephrine)     - discussed meds and SE's and recc to resume her Abilify.   - ROV - prn

## 2016-09-02 NOTE — Patient Instructions (Signed)
Temporomandibular Joint Syndrome Temporomandibular joint (TMJ) syndrome is a condition that affects the joints between your jaw and your skull. The TMJs are located near your ears and allow your jaw to open and close. These joints and the nearby muscles are involved in all movements of the jaw. People with TMJ syndrome have pain in the area of these joints and muscles. Chewing, biting, or other movements of the jaw can be difficult or painful. TMJ syndrome can be caused by various things. In many cases, the condition is mild and goes away within a few weeks. For some people, the condition can become a long-term problem. What are the causes? Possible causes of TMJ syndrome include:  Grinding your teeth or clenching your jaw. Some people do this when they are under stress.  Arthritis.  Injury to the jaw.  Head or neck injury.  Teeth or dentures that are not aligned well. In some cases, the cause of TMJ syndrome may not be known. What are the signs or symptoms? The most common symptom is an aching pain on the side of the head in the area of the TMJ. Other symptoms may include:  Pain when moving your jaw, such as when chewing or biting.  Being unable to open your jaw all the way.  Making a clicking sound when you open your mouth.  Headache.  Earache.  Neck or shoulder pain. How is this diagnosed? Diagnosis can usually be made based on your symptoms, your medical history, and a physical exam. Your health care provider may check the range of motion of your jaw. Imaging tests, such as X-rays or an MRI, are sometimes done. You may need to see your dentist to determine if your teeth and jaw are lined up correctly. How is this treated? TMJ syndrome often goes away on its own. If treatment is needed, the options may include:  Eating soft foods and applying ice or heat.  Medicines to relieve pain or inflammation.  Medicines to relax the muscles.  A splint, bite plate, or mouthpiece to  prevent teeth grinding or jaw clenching.  Relaxation techniques or counseling to help reduce stress.  Transcutaneous electrical nerve stimulation (TENS). This helps to relieve pain by applying an electrical current through the skin.  Acupuncture. This is sometimes helpful to relieve pain.  Jaw surgery. This is rarely needed. Follow these instructions at home:  Take medicines only as directed by your health care provider.  Eat a soft diet if you are having trouble chewing.  Apply ice to the painful area.  Put ice in a plastic bag.  Place a towel between your skin and the bag.  Leave the ice on for 20 minutes, 2-3 times a day.  Apply a warm compress to the painful area as directed.  Massage your jaw area and perform any jaw stretching exercises as recommended by your health care provider.  If you were given a mouthpiece or bite plate, wear it as directed.  Avoid foods that require a lot of chewing. Do not chew gum.  Keep all follow-up visits as directed by your health care provider. This is important. Contact a health care provider if:  You are having trouble eating.  You have new or worsening symptoms. Get help right away if:  Your jaw locks open or closed. This information is not intended to replace advice given to you by your health care provider. Make sure you discuss any questions you have with your health care provider. Document Released: 06/24/2001 Document   Revised: 05/29/2016 Document Reviewed: 05/04/2014 Elsevier Interactive Patient Education  2017 [employer]vier Inc.  

## 2016-09-07 NOTE — Addendum Note (Signed)
Addended by: Jhair Witherington A on: 09/07/2016 06:28 PM   Modules accepted: Orders

## 2016-09-08 ENCOUNTER — Other Ambulatory Visit: Payer: Self-pay | Admitting: Obstetrics & Gynecology

## 2016-09-08 NOTE — Telephone Encounter (Signed)
Medication refill request: Synthroid 75mcg Last AEX:  09/04/15 SM Next AEX: 09/15/16  Refill authorized: 09/13/15 #90 w/1 refill; today please advise

## 2016-09-09 ENCOUNTER — Other Ambulatory Visit: Payer: Self-pay | Admitting: Internal Medicine

## 2016-09-11 DIAGNOSIS — J310 Chronic rhinitis: Secondary | ICD-10-CM | POA: Diagnosis not present

## 2016-09-11 DIAGNOSIS — R42 Dizziness and giddiness: Secondary | ICD-10-CM | POA: Diagnosis not present

## 2016-09-15 ENCOUNTER — Emergency Department (HOSPITAL_COMMUNITY)
Admission: EM | Admit: 2016-09-15 | Discharge: 2016-09-15 | Disposition: A | Discharge status: Home / Self Care | Payer: Medicare Other | Attending: Emergency Medicine | Admitting: Emergency Medicine

## 2016-09-15 ENCOUNTER — Emergency Department (HOSPITAL_COMMUNITY): Payer: Medicare Other

## 2016-09-15 ENCOUNTER — Ambulatory Visit: Payer: Medicare Other | Admitting: Obstetrics & Gynecology

## 2016-09-15 ENCOUNTER — Telehealth: Payer: Self-pay | Admitting: Obstetrics & Gynecology

## 2016-09-15 ENCOUNTER — Encounter (HOSPITAL_COMMUNITY): Payer: Self-pay | Admitting: Emergency Medicine

## 2016-09-15 DIAGNOSIS — H9201 Otalgia, right ear: Secondary | ICD-10-CM | POA: Diagnosis not present

## 2016-09-15 DIAGNOSIS — R0981 Nasal congestion: Secondary | ICD-10-CM | POA: Diagnosis not present

## 2016-09-15 DIAGNOSIS — R05 Cough: Secondary | ICD-10-CM | POA: Diagnosis not present

## 2016-09-15 DIAGNOSIS — R519 Headache, unspecified: Secondary | ICD-10-CM

## 2016-09-15 DIAGNOSIS — R51 Headache: Secondary | ICD-10-CM | POA: Insufficient documentation

## 2016-09-15 DIAGNOSIS — Z79899 Other long term (current) drug therapy: Secondary | ICD-10-CM | POA: Diagnosis not present

## 2016-09-15 DIAGNOSIS — E871 Hypo-osmolality and hyponatremia: Secondary | ICD-10-CM | POA: Diagnosis not present

## 2016-09-15 DIAGNOSIS — R0789 Other chest pain: Secondary | ICD-10-CM | POA: Diagnosis not present

## 2016-09-15 DIAGNOSIS — F17210 Nicotine dependence, cigarettes, uncomplicated: Secondary | ICD-10-CM | POA: Insufficient documentation

## 2016-09-15 DIAGNOSIS — E039 Hypothyroidism, unspecified: Secondary | ICD-10-CM | POA: Diagnosis not present

## 2016-09-15 DIAGNOSIS — R531 Weakness: Secondary | ICD-10-CM | POA: Diagnosis not present

## 2016-09-15 LAB — CBC WITH DIFFERENTIAL/PLATELET
BASOS PCT: 1 %
Basophils Absolute: 0 10*3/uL (ref 0.0–0.1)
EOS ABS: 0.6 10*3/uL (ref 0.0–0.7)
Eosinophils Relative: 7 %
HEMATOCRIT: 37.1 % (ref 36.0–46.0)
Hemoglobin: 12.6 g/dL (ref 12.0–15.0)
Lymphocytes Relative: 32 %
Lymphs Abs: 2.7 10*3/uL (ref 0.7–4.0)
MCH: 29.7 pg (ref 26.0–34.0)
MCHC: 34 g/dL (ref 30.0–36.0)
MCV: 87.5 fL (ref 78.0–100.0)
MONO ABS: 0.6 10*3/uL (ref 0.1–1.0)
MONOS PCT: 7 %
NEUTROS ABS: 4.5 10*3/uL (ref 1.7–7.7)
Neutrophils Relative %: 53 %
Platelets: 232 10*3/uL (ref 150–400)
RBC: 4.24 MIL/uL (ref 3.87–5.11)
RDW: 13.1 % (ref 11.5–15.5)
WBC: 8.4 10*3/uL (ref 4.0–10.5)

## 2016-09-15 LAB — COMPREHENSIVE METABOLIC PANEL
ALBUMIN: 3.9 g/dL (ref 3.5–5.0)
ALT: 16 U/L (ref 14–54)
ANION GAP: 9 (ref 5–15)
AST: 17 U/L (ref 15–41)
Alkaline Phosphatase: 76 U/L (ref 38–126)
BILIRUBIN TOTAL: 0.6 mg/dL (ref 0.3–1.2)
BUN: 5 mg/dL — ABNORMAL LOW (ref 6–20)
CO2: 23 mmol/L (ref 22–32)
Calcium: 8.8 mg/dL — ABNORMAL LOW (ref 8.9–10.3)
Chloride: 92 mmol/L — ABNORMAL LOW (ref 101–111)
Creatinine, Ser: 0.61 mg/dL (ref 0.44–1.00)
GFR calc non Af Amer: >60 mL/min (ref >60)
GLUCOSE: 96 mg/dL (ref 65–99)
POTASSIUM: 3.2 mmol/L — AB (ref 3.5–5.1)
Sodium: 124 mmol/L — ABNORMAL LOW (ref 135–145)
TOTAL PROTEIN: 5.8 g/dL — AB (ref 6.5–8.1)

## 2016-09-15 LAB — I-STAT TROPONIN, ED: TROPONIN I, POC: 0 ng/mL (ref 0.00–0.08)

## 2016-09-15 MED ORDER — SODIUM CHLORIDE 0.9 % IV BOLUS (SEPSIS)
1000.0000 mL | Freq: Once | INTRAVENOUS | Status: AC
  Administered 2016-09-15: 1000 mL via INTRAVENOUS

## 2016-09-15 MED ORDER — CLONAZEPAM 0.5 MG PO TABS
1.0000 mg | ORAL_TABLET | Freq: Once | ORAL | Status: AC
  Administered 2016-09-15: 1 mg via ORAL

## 2016-09-15 MED ORDER — CLONAZEPAM 0.5 MG PO TABS
2.0000 mg | ORAL_TABLET | Freq: Once | ORAL | Status: DC
  Filled 2016-09-15: qty 4

## 2016-09-15 MED ORDER — LORAZEPAM 2 MG/ML IJ SOLN: 1.0000 mg | Freq: Once | INTRAMUSCULAR | Status: DC

## 2016-09-15 MED ORDER — PROCHLORPERAZINE EDISYLATE 5 MG/ML IJ SOLN
10.0000 mg | Freq: Once | INTRAMUSCULAR | Status: AC
  Administered 2016-09-15: 10 mg via INTRAVENOUS
  Filled 2016-09-15: qty 2

## 2016-09-15 MED ORDER — DIPHENHYDRAMINE HCL 50 MG/ML IJ SOLN
25.0000 mg | Freq: Once | INTRAMUSCULAR | Status: AC
  Administered 2016-09-15: 25 mg via INTRAVENOUS
  Filled 2016-09-15: qty 1

## 2016-09-15 NOTE — ED Provider Notes (Signed)
[facility]-EMERGENCY DEPT Provider Note   CSN: [id number] Arrival date & time: 09/15/16  0056  By signing my name below, I, Suzan Slick. Elon Spanner, attest that this documentation has been prepared under the direction and in the presence of Shon Baton, MD.  Electronically Signed: Suzan Slick. Elon Spanner, ED Scribe. 09/15/16. 1:38 AM.    History   Chief Complaint Chief Complaint  Patient presents with  . Cough  . Nasal Congestion   The history is provided by the patient. No language interpreter was used.    HPI Comments: Susan Bartlett is a 50 y.o. female with a PMHx of hyperlipemia and hypothyroidism who presents to the Emergency Department complaining of constant, unchanged cough, R sided otalgia, and nasal congestion x 1 week. She has multiple complaints.  Pt states she has been evaluated by her ENT doctor for this and was told "it was related to some fluid in my middle ear". She also reports gradually worsening HA, generalized weakness, posterior neck stiffness,  and chest pain. Currently she rates HA "20/10". Pt admits to a history of recurrent HAs and states current HA is "much worse".Headache was gradual in onset. Denies neurologic symptoms. OTC medications attempted at home without any long term improvement. No recent fever or chills. Patient reports increased stressors at home and symptoms got worse tonight as she attended a hospice banquet.  PCP: Nadean Corwin, MD    Past Medical History:  Diagnosis Date  . Anal fissure 1994  . Anxiety   . Depression   . Endometriosis   . Fracture 08/2011   fracture left wrist and elbow  . Hemorrhoids 2008  . Hepatomegaly   . Hypothyroidism   . Mixed hyperlipidemia   . Obesity   . OCD (obsessive compulsive disorder)   . Panic disorder   . Plantar fasciitis, right 03/2014  . RLS (restless legs syndrome) 2014   vis sleep study- no sleep apnea  . Tobacco abuse    not tolerated Chantix in the past  . Vitamin D deficiency     Patient Active  Problem List   Diagnosis Date Noted  . Tobacco use disorder 01/16/2015  . Prediabetes 01/16/2015  . TMJ (dislocation of temporomandibular joint) 01/16/2015  . Vitamin D deficiency   . Hepatomegaly   . Mixed hyperlipidemia   . Panic disorder   . Anal fissure   . Depression, major, in remission (HCC)   . Hypothyroidism   . Obesity     Past Surgical History:  Procedure Laterality Date  . CHOLECYSTECTOMY    . COLONOSCOPY  03/25/2007   small external hemorroids  . HEMORROIDECTOMY    . LEFT OOPHORECTOMY    . TONSILLECTOMY      OB History    Gravida Para Term Preterm AB Living   1 1       1    SAB TAB Ectopic Multiple Live Births                   Home Medications    Prior to Admission medications   Medication Sig Start Date End Date Taking? Authorizing Provider  ARIPiprazole (ABILIFY) 5 MG tablet  08/12/16   Historical Provider, MD  clonazePAM (KLONOPIN) 1 MG tablet Take 2 mg by mouth 3 (three) times daily. Take 1/2 by mouth every am, 1/2 at lunch, and 1 every night    Historical Provider, MD  fluticasone (FLONASE) 50 MCG/ACT nasal spray Place 2 sprays into both nostrils daily. 08/26/16   Terri Piedra, PA-C  ibuprofen (ADVIL,MOTRIN) 200 MG tablet Take 200 mg by mouth every 6 (six) hours as needed.    Historical Provider, MD  levothyroxine (SYNTHROID, LEVOTHROID) 75 MCG tablet TAKE ONE TABLET BY MOUTH ONCE DAILY 09/08/16   Jerene BearsMary S Miller, MD  meclizine (ANTIVERT) 25 MG tablet Take 1 tablet (25 mg total) by mouth 3 (three) times daily as needed for dizziness. 08/26/16   Dorina Ribaudo Forcucci, PA-C  methocarbamol (ROBAXIN) 500 MG tablet  08/24/16   Historical Provider, MD  pravastatin (PRAVACHOL) 40 MG tablet TAKE ONE TABLET BY MOUTH ONCE DAILY 05/26/16   Lucky CowboyWilliam McKeown, MD  sertraline (ZOLOFT) 100 MG tablet Take 200 mg by mouth 2 (two) times daily.     Historical Provider, MD  Vitamin D, Ergocalciferol, (DRISDOL) 50000 units CAPS capsule Take 50,000 Units by mouth 2 (two) times a  week.    Historical Provider, MD    Family History Family History  Problem Relation Age of Onset  . Breast cancer Mother   . Diabetes Mother   . Drug abuse Brother     and alcohol  . Cirrhosis Father     alcohol  . Heart disease Father     MI  . Colon cancer Maternal Grandmother 50  . Alcohol abuse Sister   . Thyroid disease Neg Hx     Social History Social History  Substance Use Topics  . Smoking status: Current Every Day Smoker    Packs/day: 1.00    Types: Cigarettes  . Smokeless tobacco: Never Used  . Alcohol use No     Allergies   Patient has no known allergies.   Review of Systems Review of Systems  Constitutional: Negative for chills and fever.  HENT: Positive for congestion and ear pain.   Respiratory: Positive for cough. Negative for shortness of breath.   Cardiovascular: Positive for chest pain.  Gastrointestinal: Negative for abdominal pain, nausea and vomiting.  Musculoskeletal: Positive for neck stiffness.  Neurological: Positive for weakness and headaches.  Psychiatric/Behavioral: Negative for confusion.  All other systems reviewed and are negative.    Physical Exam Updated Vital Signs BP 116/85   Pulse 67   Temp 98.1 F (36.7 C) (Oral)   Resp 26   Ht 5\' 9"  (1.753 m)   Wt 217 lb (98.4 kg)   LMP 08/04/2012   SpO2 99%   BMI 32.05 kg/m   Physical Exam  Constitutional: She is oriented to person, place, and time. No distress.  HENT:  Head: Normocephalic and atraumatic.  Nose: Nose normal.  Eyes: Pupils are equal, round, and reactive to light.  Neck: Normal range of motion.  Slow range of motion but normal range of motion, no meningismus, no adenopathy  Cardiovascular: Normal rate, regular rhythm and normal heart sounds.   No murmur heard. Pulmonary/Chest: Effort normal and breath sounds normal. No respiratory distress. She has no wheezes.  Abdominal: Soft. Bowel sounds are normal. There is no tenderness. There is no guarding.    Neurological: She is alert and oriented to person, place, and time.  Cranial nerves II through XII intact, 5 out of 5 strength in all 4 extremities, no dysmetria to finger-nose-finger  Skin: Skin is warm and dry.  Psychiatric:  Tearful at times  Nursing note and vitals reviewed.    ED Treatments / Results   DIAGNOSTIC STUDIES: Oxygen Saturation is 98% on RA, Normal by my interpretation.    COORDINATION OF CARE: 1:38 AM- Will give fluids, blood work, EKG, and CXR. Discussed treatment plan  with pt at bedside and pt agreed to plan.     Labs (all labs ordered are listed, but only abnormal results are displayed) Labs Reviewed  COMPREHENSIVE METABOLIC PANEL - Abnormal; Notable for the following:       Result Value   Sodium 124 (*)    Potassium 3.2 (*)    Chloride 92 (*)    BUN 5 (*)    Calcium 8.8 (*)    Total Protein 5.8 (*)    All other components within normal limits  CBC WITH DIFFERENTIAL/PLATELET  Rosezena Sensor, ED    EKG  EKG Interpretation  Date/Time:  Monday September 15 2016 02:13:21 EST Ventricular Rate:  62 PR Interval:    QRS Duration: 102 QT Interval:  431 QTC Calculation: 438 R Axis:   49 Text Interpretation:  Sinus rhythm Confirmed by Wilkie Aye  MD, Murrell Elizondo ([id number]) on 09/15/2016 2:18:42 AM       Radiology Dg Chest 2 View  Result Date: 09/15/2016 CLINICAL DATA:  Acute onset of cough. Fluid in ear for the past 3 weeks. Initial encounter. EXAM: CHEST  2 VIEW COMPARISON:  Chest radiograph performed 12/10/2012 FINDINGS: The lungs are well-aerated and clear. There is no evidence of focal opacification, pleural effusion or pneumothorax. The heart is normal in size; the mediastinal contour is within normal limits. No acute osseous abnormalities are seen. Clips are noted at the upper abdomen. IMPRESSION: No acute cardiopulmonary process seen. Electronically Signed   By: Roanna Raider M.D.   On: 09/15/2016 02:14    Procedures Procedures (including critical care  time)  Medications Ordered in ED Medications  sodium chloride 0.9 % bolus 1,000 mL (0 mLs Intravenous Stopped 09/15/16 0300)  prochlorperazine (COMPAZINE) injection 10 mg (10 mg Intravenous Given 09/15/16 0237)  diphenhydrAMINE (BENADRYL) injection 25 mg (25 mg Intravenous Given 09/15/16 0236)  sodium chloride 0.9 % bolus 1,000 mL (1,000 mLs Intravenous New Bag/Given 09/15/16 0358)  clonazePAM (KLONOPIN) tablet 1 mg (1 mg Oral Given 09/15/16 0353)     Initial Impression / Assessment and Plan / ED Course  I have reviewed the triage vital signs and the nursing notes.  Pertinent labs & imaging results that were available during my care of the patient were reviewed by me and considered in my medical decision making (see chart for details).  Clinical Course as of Sep 15 512  Mon Sep 15, 2016  0345 Complaining of restless leg. History of the same. Workup notable for hyponatremia at 124. No history of the same. Reports normal diet. She has already received 1 L of fluid. She reports that she feels much better. I have ordered a second liter of fluid. If patient continues to feel well, will discharge following second liter fluid with close primary follow-up for recheck.  [CH]    Clinical Course User Index [CH] Shon Baton, MD    Patient presents with multiple complaints. Reports persistent right ear pain. No headache. She is nonfocal on neurologic exam. Doubt meningitis or subarachnoid hemorrhage. Does report increased stressors and is at times tearful. Patient was given a migraine cocktail. Notably hyponatremic. Denies any new medications. Reports a well-balanced diet but has had some diarrhea. She was given 2 L of fluid.  On multiple rechecks, patient reports generalized improvement. She does report that her restless legs is acting up. She was given a clonazepam for this. She will need close follow-up with her primary physician for recheck of her sodium levels. She stated understanding. Follow-up  with ENT for  right ear pain.  After history, exam, and medical workup I feel the patient has been appropriately medically screened and is safe for discharge home. Pertinent diagnoses were discussed with the patient. Patient was given return precautions.   Final Clinical Impressions(s) / ED Diagnoses   Final diagnoses:  Hyponatremia  Right ear pain  Acute nonintractable headache, unspecified headache type    New Prescriptions New Prescriptions   No medications on file   I personally performed the services described in this documentation, which was scribed in my presence. The recorded information has been reviewed and is accurate.    Shon Batonourtney F Carli Lefevers, MD 09/15/16 [phone]

## 2016-09-15 NOTE — ED Notes (Signed)
Pt to restroom with 2 assist (RN and pts son on each side); pt was able to stand from bed; pt appeared weak with knees buckling; RN offered wheelchair to the restroom- pt insisted that she try to walk; pt exited room D34 with RN on one arm and son on the other and patient bent her knees and began to slowly lower herself to the floor; pt was assisted to chair by 3 person assist, transferred to wheelchair and taken to the restroom to obtain urine sample; RN offered to help in the restroom to prevent future falls but pt refused and asked her family to help her in the restroom; fall band placed on wrist when patient exited restroom and pt taken to radiology via wheelchair with xray tech

## 2016-09-15 NOTE — ED Notes (Signed)
Pt verbalized understanding of d/c instructions and has no further questions. Pt is stable, A&Ox4, VSS.  

## 2016-09-15 NOTE — Discharge Instructions (Signed)
You were seen today for multiple symptoms including earache, headache, weakness. Your workup is notable for a sodium that is low. You need to make sure to hydrate appropriately at home and eat a well-balanced diet. You need to follow-up with her primary physician later this week for recheck of your sodium. Your HEENT exam is largely reassuring. Follow-up with ENT and your primary physician.

## 2016-09-15 NOTE — ED Notes (Signed)
Patient transported to X-ray 

## 2016-09-15 NOTE — Telephone Encounter (Signed)
Patient canceled her aex appointment appointment today due to an upper restpitory infection. Patient was treated in the ER last night. Patient rescheduled to 11/04/16.

## 2016-09-15 NOTE — ED Triage Notes (Addendum)
Pt to ED via [facility]>  C/o congestion, R ear pain, and intermittent diarrhea x 3 weeks.  Reports bilateral leg weakness and productive cough with clear sputum that started today.  Pt has seen PCP and ENT for ear pain/congestion.

## 2016-09-16 ENCOUNTER — Ambulatory Visit (INDEPENDENT_AMBULATORY_CARE_PROVIDER_SITE_OTHER): Payer: Medicare Other | Admitting: Physician Assistant

## 2016-09-16 ENCOUNTER — Encounter: Payer: Self-pay | Admitting: Physician Assistant

## 2016-09-16 VITALS — BP 166/100 | HR 88 | Temp 97.7°F | Resp 16 | Ht 66.75 in | Wt 220.4 lb

## 2016-09-16 DIAGNOSIS — E876 Hypokalemia: Secondary | ICD-10-CM | POA: Diagnosis not present

## 2016-09-16 DIAGNOSIS — E559 Vitamin D deficiency, unspecified: Secondary | ICD-10-CM | POA: Diagnosis not present

## 2016-09-16 DIAGNOSIS — F325 Major depressive disorder, single episode, in full remission: Secondary | ICD-10-CM

## 2016-09-16 DIAGNOSIS — E782 Mixed hyperlipidemia: Secondary | ICD-10-CM

## 2016-09-16 DIAGNOSIS — E039 Hypothyroidism, unspecified: Secondary | ICD-10-CM | POA: Diagnosis not present

## 2016-09-16 DIAGNOSIS — F17200 Nicotine dependence, unspecified, uncomplicated: Secondary | ICD-10-CM | POA: Diagnosis not present

## 2016-09-16 DIAGNOSIS — Z0001 Encounter for general adult medical examination with abnormal findings: Secondary | ICD-10-CM

## 2016-09-16 DIAGNOSIS — F410 Panic disorder [episodic paroxysmal anxiety] without agoraphobia: Secondary | ICD-10-CM

## 2016-09-16 DIAGNOSIS — R7303 Prediabetes: Secondary | ICD-10-CM | POA: Diagnosis not present

## 2016-09-16 DIAGNOSIS — H8113 Benign paroxysmal vertigo, bilateral: Secondary | ICD-10-CM | POA: Diagnosis not present

## 2016-09-16 DIAGNOSIS — R6889 Other general symptoms and signs: Secondary | ICD-10-CM

## 2016-09-16 DIAGNOSIS — K602 Anal fissure, unspecified: Secondary | ICD-10-CM | POA: Diagnosis not present

## 2016-09-16 DIAGNOSIS — M26629 Arthralgia of temporomandibular joint, unspecified side: Secondary | ICD-10-CM

## 2016-09-16 DIAGNOSIS — R160 Hepatomegaly, not elsewhere classified: Secondary | ICD-10-CM

## 2016-09-16 DIAGNOSIS — E871 Hypo-osmolality and hyponatremia: Secondary | ICD-10-CM

## 2016-09-16 DIAGNOSIS — Z0000 Encounter for general adult medical examination without abnormal findings: Secondary | ICD-10-CM

## 2016-09-16 DIAGNOSIS — S0300XD Dislocation of jaw, unspecified side, subsequent encounter: Secondary | ICD-10-CM

## 2016-09-16 LAB — CBC WITH DIFFERENTIAL/PLATELET
BASOS ABS: 71 {cells}/uL (ref 0–200)
Basophils Relative: 1 %
Eosinophils Absolute: 497 cells/uL (ref 15–500)
Eosinophils Relative: 7 %
HEMATOCRIT: 42.7 % (ref 35.0–45.0)
HEMOGLOBIN: 13.9 g/dL (ref 11.7–15.5)
LYMPHS ABS: 2130 {cells}/uL (ref 850–3900)
LYMPHS PCT: 30 %
MCH: 30.4 pg (ref 27.0–33.0)
MCHC: 32.6 g/dL (ref 32.0–36.0)
MCV: 93.4 fL (ref 80.0–100.0)
MPV: 10.3 fL (ref 7.5–12.5)
Monocytes Absolute: 426 cells/uL (ref 200–950)
Monocytes Relative: 6 %
NEUTROS PCT: 56 %
Neutro Abs: 3976 cells/uL (ref 1500–7800)
Platelets: 317 10*3/uL (ref 140–400)
RBC: 4.57 MIL/uL (ref 3.80–5.10)
RDW: 14.2 % (ref 11.0–15.0)
WBC: 7.1 10*3/uL (ref 3.8–10.8)

## 2016-09-16 NOTE — Progress Notes (Signed)
MEDICARE ANNUAL WELLNESS VISIT AND ER follow up  Assessment:    Hypothyroidism, unspecified hypothyroidism type Hypothyroidism-check TSH level, continue medications the same, reminded to take on an empty stomach 30-32mins before food.  - TSH   Hepatomegaly Weight loss advised, no alcohol - Hepatic function panel  Mixed hyperlipidemia -continue medications, check lipids, decrease fatty foods, increase activity. 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   Prediabetes Discussed general issues about diabetes pathophysiology and management., Educational material distributed., Suggested low cholesterol diet., Encouraged aerobic exercise., Discussed foot care., Reminded to get yearly retinal exam. - Hemoglobin A1c - Insulin, fasting  TMJ (dislocation of temporomandibular joint), subsequent encounter information given to the patient, no gum/decrease hard foods, warm wet wash clothes, decrease stress, talk with dentist about possible night guard, can do massage, and exercise.   Hyponatremia Cut back on water, add gatorade, table salt, cut back on zoloft to 150 daily, don't add on abilify yet, will check labs -     CBC with Differential/Platelet -     BASIC METABOLIC PANEL WITH GFR -     Hepatic function panel -     TSH -     Osmolality,  urine -     Osmolality -     Sodium, urine, random  Hypokalemia -     BASIC METABOLIC PANEL WITH GFR  Benign paroxysmal positional vertigo due to bilateral vestibular disorder Normal neuro, if not better may get MRI mastoid/head, if worse go to ER- check labs     Over 40 minutes of exam, counseling, chart review and critical decision making was performed  Plan:   During the course of the visit the patient was educated and counseled about appropriate screening and preventive services including:    Pneumococcal vaccine   Influenza vaccine  Td vaccine  Screening electrocardiogram  Bone densitometry screening  Colorectal cancer screening  Diabetes screening  Glaucoma screening  Nutrition counseling   Advanced directives: requested   Subjective:  Susan Bartlett is a 50 y.o. female who presents for Medicare Annual Wellness Visit and follow up from the ER.  She was seen 11/14 for dizziness, and again 11/21, 11/26, and saw ENT that told her she had mastoiditis, she went to the ER and found to have hyponatremia and hypokalemia.  She is on abilify, celexa and is a smoker, normal CXR in ER yesterday. She is on klonopin for panic attacks/anxiety, and she is on zoloft for depression, follows with Dr. Richardean Sale. This has been worsened by the passing of her mother. States she accidentally drank 1-2 L in a day.    Her blood pressure has not been controlled at home, today their BP is BP: (!) 166/100, RECHECKED 140/90 She does not workout. She denies chest pain, shortness of breath, dizziness.  She is on cholesterol medication, pravastatin 40 and denies myalgias. Her cholesterol is not at goal. The cholesterol last visit was:  Lab Results  Component Value Date   CHOL 177 07/09/2016   HDL 42 (L) 07/09/2016   LDLCALC 103 07/09/2016   TRIG 161 (H) 07/09/2016   CHOLHDL 4.2 07/09/2016  Last A1C in the office was:  Lab Results  Component Value Date   HGBA1C 5.2 07/09/2016   Patient is on Vitamin D supplement.   Lab Results  Component Value Date   VD25OH 15 (L) 07/09/2016   She is on thyroid medication. Her medication was not changed last visit.   Lab Results  Component Value Date   TSH 3.22 07/09/2016  She is on gabapentin for RLS.     Medication Review: Current Outpatient Prescriptions on File Prior to Visit  Medication Sig Dispense Refill  . ARIPiprazole (ABILIFY) 5 MG tablet     . clonazePAM (KLONOPIN) 1 MG tablet Take 2 mg by mouth 3 (three) times daily. Take 1/2 by mouth every am, 1/2 at lunch, and 1 every night    . ibuprofen (ADVIL,MOTRIN) 200 MG tablet Take 200 mg by mouth every 6 (six) hours as needed.    Marland Kitchen. levothyroxine (SYNTHROID, LEVOTHROID) 75 MCG tablet TAKE ONE TABLET BY MOUTH ONCE DAILY 30 tablet 1  . meclizine (ANTIVERT) 25 MG tablet Take 1 tablet (25 mg total) by mouth 3 (three) times daily as needed for dizziness. 60 tablet 0  . pravastatin (PRAVACHOL) 40 MG tablet TAKE ONE TABLET BY MOUTH ONCE DAILY 90 tablet 3  . sertraline (ZOLOFT) 100 MG tablet Take 200 mg by mouth 2 (two) times daily.     . Vitamin D, Ergocalciferol, (DRISDOL) 50000 units CAPS capsule Take 50,000 Units by mouth 2 (two) times a week.    . methocarbamol (ROBAXIN) 500 MG tablet      No current facility-administered medications on file prior to visit.     Current Problems (verified) Patient Active Problem List   Diagnosis Date Noted  . Tobacco use disorder 01/16/2015  . Prediabetes 01/16/2015  . TMJ (dislocation of temporomandibular joint) 01/16/2015  . Vitamin D deficiency   . Hepatomegaly   . Mixed hyperlipidemia   . Panic disorder   . Anal fissure   . Depression, major, in remission (HCC)   . Hypothyroidism   . Obesity     Screening Tests Immunization History  Administered Date(s) Administered  . Influenza Split 08/01/2013  . Td 11/18/2012   Preventative care: Tetanus: 11/2012 Pneumovax: N/A Prevnar 13: N/A Flu vaccines declines Zostavax:  N/A  Pap:  08/2015, Dr. Hyacinth MeekerMiller MGM: 01/2016 DEXA: 2008, heel, neg Colonoscopy: 11/2014 due 10 years EGD: N/A PFT: 01/2013 Stress test: 12/2012 [facility]([place]ishan) Echo: 12/2012 CT head 2013  MRI brain 2011  Names of Other Physician/Practitioners you currently use: 1. [facility] here for primary care 2.  [facility], eye doctor, last visit 1999 3. Dr. Lequita AsalBen Turner , dentist, last visit 6 years Patient Care Team: Lucky CowboyWilliam McKeown, MD as PCP - General (Internal Medicine) Wendall StadePeter C Nishan, MD as Consulting Physician (Cardiology) Iva Booparl E Gessner, MD as Consulting Physician (Gastroenterology) Jerene BearsMary S Miller, MD as Consulting Physician (Gynecology)  Allergies No Known Allergies  SURGICAL HISTORY She  has a past surgical history that includes Colonoscopy (03/25/2007); Hemorroidectomy; Cholecystectomy; Left oophorectomy; and Tonsillectomy. FAMILY HISTORY Her family history includes Alcohol abuse in her sister; Breast cancer in her mother; Cirrhosis in her father; Colon cancer (age of onset: 6650) in her maternal grandmother; Diabetes in her mother; Drug abuse in her brother; Heart disease in her  father. SOCIAL HISTORY She  reports that she has been smoking Cigarettes.  She has been smoking about 1.00 pack per day. She has never used smokeless tobacco. She reports that she does not drink alcohol or use drugs.  MEDICARE WELLNESS OBJECTIVES: Physical activity: Current Exercise Habits: The patient does not participate in regular exercise at present Cardiac risk factors: Cardiac Risk Factors include: advanced age (>5555men, 24>65 women);dyslipidemia;hypertension;sedentary lifestyle;obesity (BMI >30kg/m2);smoking/ tobacco exposure Depression/mood screen:   Depression screen [facility] 09/16/2016  Decreased Interest 1  Down, Depressed, Hopeless 1  PHQ - 2 Score 2  Altered sleeping 1  Tired, decreased energy 1  Change in appetite 1  Feeling bad or failure about yourself   0  Trouble concentrating 1  Moving slowly or fidgety/restless 0  Suicidal thoughts 0  PHQ-9 Score 6  Difficult doing work/chores Somewhat difficult    ADLs:  In your present state of health, do you have any difficulty performing the following activities: 09/16/2016  Hearing? N  Vision? N  Difficulty concentrating or making decisions? N  Walking or climbing stairs? N  Dressing or bathing? N  Doing errands, shopping? N  Some recent data might be hidden     Cognitive Testing  Alert? Yes  Normal Appearance?Yes  Oriented to person? Yes  Place? Yes   Time? Yes  Recall of three objects?  Yes  Can perform simple calculations? Yes  Displays appropriate judgment?Yes  Can read the correct time from a watch face?Yes  EOL planning: Would patient like information on creating a medical advance directive?: No - Patient declined   Review of Systems  Constitutional: Positive for malaise/fatigue. Negative for chills and fever.  HENT: Positive for congestion and ear pain. Negative for hearing loss and sore throat.   Respiratory: Negative for cough, sputum production, shortness of breath and wheezing.   Cardiovascular: Negative for chest pain, palpitations and leg swelling.  Neurological: Positive for headaches.  Psychiatric/Behavioral: Positive for depression.    Objective:     Blood pressure (!) 166/100, pulse 88, temperature 97.7 F (36.5 C), resp. rate 16, height 5' 6.75" (1.695 m), weight 220 lb 6.4 oz (100 kg), last menstrual period 08/04/2012. Body mass index is 34.78 kg/m.  General appearance: alert, no distress, WD/WN, female HEENT: normocephalic, sclerae anicteric, TMs pearly, nares patent, no discharge or erythema, pharynx normal, + TMJ tenderness bilateral.  Oral cavity: MMM, no lesions Neck: supple, no lymphadenopathy, no thyromegaly, no masses Heart: RRR, normal S1, S2, no murmurs Lungs: CTA bilaterally, no wheezes, rhonchi, or rales Abdomen: +bs, soft, non tender, non  distended, no masses, no hepatomegaly, no splenomegaly Musculoskeletal: nontender, no swelling, no obvious deformity Extremities: no edema, no cyanosis, no clubbing Pulses: 2+ symmetric, upper and lower extremities, normal cap refill Neurological: alert, oriented x 3, CN2-12 intact, strength normal upper extremities and lower extremities, sensation normal throughout, DTRs 2+ throughout, no cerebellar signs, normalgait Psychiatric: normal affect, behavior normal, pleasant   Medicare Attestation I have personally reviewed: The patient's medical and social history Their use of alcohol, tobacco or illicit drugs Their current medications and supplements The patient's functional ability including ADLs,fall risks, home safety risks, cognitive, and hearing and visual impairment Diet and physical activities Evidence for depression or mood disorders  The patient's weight, height, BMI, and visual acuity have been recorded in the chart.  I have made referrals, counseling, and provided education to the patient based on review of the above and I have provided the patient with a written personalized care  plan for preventive services.     Quentin Mulling, PA-C   09/16/2016

## 2016-09-16 NOTE — Patient Instructions (Addendum)
[facility] [place] [facility]  Increase table salt Do G2 or propel with electrolytes Zoloft 100mg  do 1 pill daily PM and 1/2 pill daily AM Will check labs  You can take tylenol (500mg ) or tylenol arthritis (650mg ) with the meloxicam/antiinflammatories. The max you can take of tylenol a day is 3000mg  daily, this is a max of 6 pills a day of the regular tyelnol (500mg ) or a max of 4 a day of the tylenol arthritis (650mg ) as long as no other medications you are taking contain tylenol.   Continue the flonase AS needed, every other day, stop if nose bleed, saline  Can try the roboxin for your headache  Hyponatremia Introduction Hyponatremia is when the amount of salt (sodium) in your blood is too low. When sodium levels are low, your cells absorb extra water and they swell. The swelling happens throughout the body, but it mostly affects the brain. What are the causes? This condition may be caused by:  Heart, kidney, or liver problems.  Thyroid problems.  Adrenal gland problems.  Metabolic conditions, such as syndrome of inappropriate antidiuretic hormone (SIADH).  Severe vomiting and diarrhea.  Certain medicines or illegal drugs.  Dehydration.  Drinking too much water.  Eating a diet that is low in sodium.  Large burns on your body.  Sweating. What increases the risk? This condition is more likely to develop in people who:  Have long-term (chronic) kidney disease.  Have heart failure.  Have a medical condition that causes frequent or excessive diarrhea.  Have metabolic conditions, such as Addison disease or SIADH.  Take certain medicines that affect the sodium and fluid balance in the blood. Some of these medicine types include:  Diuretics.  NSAIDs.  Some opioid pain medicines.  Some antidepressants.  Some seizure prevention medicines. What are the signs or symptoms? Symptoms of this condition include:  Nausea and  vomiting.  Confusion.  Lethargy.  Agitation.  Headache.  Seizures.  Unconsciousness.  Appetite loss.  Muscle weakness and cramping.  Feeling weak or light-headed.  Having a rapid heart rate.  Fainting, in severe cases. How is this diagnosed? This condition is diagnosed with a medical history and physical exam. You will also have other tests, including:  Blood tests.  Urine tests. How is this treated? Treatment for this condition depends on the cause. Treatment may include:  Fluids given through an IV tube that is inserted into one of your veins.  Medicines to correct the sodium imbalance. If medicines are causing the condition, the medicines will need to be adjusted.  Limiting water or fluid intake to get the correct sodium balance. Follow these instructions at home:  Take medicines only as directed by your health care provider. Many medicines can make this condition worse. Talk with your health care provider about any medicines that you are currently taking.  Carefully follow a recommended diet as directed by your health care provider.  Carefully follow instructions from your health care provider about fluid restrictions.  Keep all follow-up visits as directed by your health care provider. This is important.  Do not drink alcohol. Contact a health care provider if:  You develop worsening nausea, fatigue, headache, confusion, or weakness.  Your symptoms go away and then return.  You have problems following the recommended diet. Get help right away if:  You have a seizure.  You faint.  You have ongoing diarrhea or vomiting. This information is not intended to replace advice given to you  by your health care provider. Make sure you discuss any questions you have with your health care provider. Document Released: 09/19/2002 Document Revised: 03/06/2016 Document Reviewed: 10/19/2014  2017 Elsevier  What is the TMJ? The temporomandibular  (tem-PUH-ro-man-DIB-yoo-ler) joint, or the TMJ, connects the upper and lower jawbones. This joint allows the jaw to open wide and move back and forth when you chew, talk, or yawn.There are also several muscles that help this joint move. There can be muscle tightness and pain in the muscle that can cause several symptoms.  What causes TMJ pain? There are many causes of TMJ pain. Repeated chewing (for example, chewing gum) and clenching your teeth can cause pain in the joint. Some TMJ pain has no obvious cause. What can I do to ease the pain? There are many things you can do to help your pain get better. When you have pain:  Eat soft foods and stay away from chewy foods (for example, taffy) Try to use both sides of your mouth to chew Don't chew gum Massage Don't open your mouth wide (for example, during yawning or singing) Don't bite your cheeks or fingernails Lower your amount of stress and worry Applying a warm, damp washcloth to the joint may help. Over-the-counter pain medicines such as ibuprofen (one brand: Advil) or acetaminophen (one brand: Tylenol) might also help. Do not use these medicines if you are allergic to them or if your doctor told you not to use them. How can I stop the pain from coming back? When your pain is better, you can do these exercises to make your muscles stronger and to keep the pain from coming back:  Resisted mouth opening: Place your thumb or two fingers under your chin and open your mouth slowly, pushing up lightly on your chin with your thumb. Hold for three to six seconds. Close your mouth slowly. Resisted mouth closing: Place your thumbs under your chin and your two index fingers on the ridge between your mouth and the bottom of your chin. Push down lightly on your chin as you close your mouth. Tongue up: Slowly open and close your mouth while keeping the tongue touching the roof of the mouth. Side-to-side jaw movement: Place an object about one fourth of an  inch thick (for example, two tongue depressors) between your front teeth. Slowly move your jaw from side to side. Increase the thickness of the object as the exercise becomes easier Forward jaw movement: Place an object about one fourth of an inch thick between your front teeth and move the bottom jaw forward so that the bottom teeth are in front of the top teeth. Increase the thickness of the object as the exercise becomes easier. These exercises should not be painful. If it hurts to do these exercises, stop doing them and talk to your family doctor.

## 2016-09-17 ENCOUNTER — Encounter: Payer: Self-pay | Admitting: Physician Assistant

## 2016-09-17 LAB — HEPATIC FUNCTION PANEL
ALBUMIN: 4.7 g/dL (ref 3.6–5.1)
ALT: 16 U/L (ref 6–29)
AST: 17 U/L (ref 10–35)
Alkaline Phosphatase: 78 U/L (ref 33–130)
Bilirubin, Direct: 0.1 mg/dL (ref <=0.2)
Indirect Bilirubin: 0.3 mg/dL (ref 0.2–1.2)
TOTAL PROTEIN: 7 g/dL (ref 6.1–8.1)
Total Bilirubin: 0.4 mg/dL (ref 0.2–1.2)

## 2016-09-17 LAB — BASIC METABOLIC PANEL WITH GFR
BUN: 4 mg/dL — ABNORMAL LOW (ref 7–25)
CALCIUM: 10.1 mg/dL (ref 8.6–10.4)
CO2: 28 mmol/L (ref 20–31)
CREATININE: 0.74 mg/dL (ref 0.50–1.05)
Chloride: 102 mmol/L (ref 98–110)
GFR, Est Non African American: >89 mL/min (ref >=60)
GLUCOSE: 82 mg/dL (ref 65–99)
Potassium: 4.7 mmol/L (ref 3.5–5.3)
Sodium: 137 mmol/L (ref 135–146)

## 2016-09-17 LAB — TSH: TSH: 3.43 m[IU]/L

## 2016-09-17 LAB — SODIUM, URINE, RANDOM: Sodium, Ur: 28 mmol/L (ref 28–272)

## 2016-09-17 LAB — OSMOLALITY, URINE: OSMOLALITY UR: 97 mosm/kg (ref 50–1200)

## 2016-09-17 LAB — OSMOLALITY: Osmolality: 283 mOsm/kg (ref 278–305)

## 2016-09-27 ENCOUNTER — Other Ambulatory Visit: Payer: Self-pay | Admitting: Internal Medicine

## 2016-09-29 ENCOUNTER — Encounter: Payer: Self-pay | Admitting: Physician Assistant

## 2016-09-29 DIAGNOSIS — E871 Hypo-osmolality and hyponatremia: Secondary | ICD-10-CM

## 2016-10-01 ENCOUNTER — Other Ambulatory Visit: Payer: Medicare Other

## 2016-10-01 DIAGNOSIS — E871 Hypo-osmolality and hyponatremia: Secondary | ICD-10-CM

## 2016-10-01 LAB — BASIC METABOLIC PANEL WITH GFR
BUN: 5 mg/dL — ABNORMAL LOW (ref 7–25)
CHLORIDE: 99 mmol/L (ref 98–110)
CO2: 24 mmol/L (ref 20–31)
CREATININE: 0.69 mg/dL (ref 0.50–1.05)
Calcium: 9.4 mg/dL (ref 8.6–10.4)
GFR, Est African American: >89 mL/min (ref >=60)
Glucose, Bld: 92 mg/dL (ref 65–99)
Potassium: 4.1 mmol/L (ref 3.5–5.3)
SODIUM: 135 mmol/L (ref 135–146)

## 2016-10-02 ENCOUNTER — Other Ambulatory Visit: Payer: Self-pay | Admitting: Physician Assistant

## 2016-10-02 ENCOUNTER — Encounter: Payer: Self-pay | Admitting: Physician Assistant

## 2016-10-02 MED ORDER — GABAPENTIN 300 MG PO CAPS
300.0000 mg | ORAL_CAPSULE | Freq: Three times a day (TID) | ORAL | 2 refills | Status: DC
Start: 2016-10-02 — End: 2017-06-09

## 2016-10-03 ENCOUNTER — Other Ambulatory Visit: Payer: Self-pay | Admitting: Sports Medicine

## 2016-10-03 DIAGNOSIS — M5442 Lumbago with sciatica, left side: Secondary | ICD-10-CM

## 2016-10-03 DIAGNOSIS — M545 Low back pain: Secondary | ICD-10-CM

## 2016-10-16 ENCOUNTER — Encounter: Payer: Self-pay | Admitting: Physician Assistant

## 2016-10-16 ENCOUNTER — Ambulatory Visit
Admission: RE | Admit: 2016-10-16 | Discharge: 2016-10-16 | Disposition: A | Discharge status: Home / Self Care | Payer: Medicare Other | Source: Ambulatory Visit | Attending: Sports Medicine | Admitting: Sports Medicine

## 2016-10-16 DIAGNOSIS — M5442 Lumbago with sciatica, left side: Secondary | ICD-10-CM

## 2016-10-16 DIAGNOSIS — M48061 Spinal stenosis, lumbar region without neurogenic claudication: Secondary | ICD-10-CM | POA: Diagnosis not present

## 2016-10-21 ENCOUNTER — Telehealth: Payer: Self-pay | Admitting: Internal Medicine

## 2016-10-21 NOTE — Telephone Encounter (Signed)
[facility] called to request that we fax most recent MRI Lumbar for patient consult with Dr Penni BombardKendall on 10-22-16. Faxed request imaging.

## 2016-10-28 DIAGNOSIS — M5126 Other intervertebral disc displacement, lumbar region: Secondary | ICD-10-CM | POA: Diagnosis not present

## 2016-10-28 DIAGNOSIS — M5441 Lumbago with sciatica, right side: Secondary | ICD-10-CM | POA: Diagnosis not present

## 2016-10-28 DIAGNOSIS — M5442 Lumbago with sciatica, left side: Secondary | ICD-10-CM | POA: Diagnosis not present

## 2016-10-30 ENCOUNTER — Encounter: Payer: Self-pay | Admitting: Internal Medicine

## 2016-11-03 NOTE — Progress Notes (Signed)
51 y.o. G1P1 SingleCaucasianF here for annual exam.  Going to the beach tomorrow.  Going [place] stay [place] friend[place] condo [place].  Admitted in December due to hyponatremia and hypokalemia.  Was observed overnight.  Having back issues.  Seeing an ortho.  Has two bulging disks in her back.    Patient's last menstrual period was 08/04/2012.          Sexually active: No.  The current method of family planning is post menopausal status.    Exercising: No.  The patient does not participate in regular exercise at present. Smoker:  yes  Health Maintenance: Pap: 09/04/15 negative  History of abnormal Pap:  no MMG:  02/08/16 BIRADS 2 benign  Colonoscopy:  11/17/14- normal- repeat 10 years  BMD:   Heel test years ago TDaP:  2014  Pneumonia vaccine(s):  never Zostavax:   never Hep C testing: not indicated  Screening Labs: drawn today, Hb today: same, Urine today: normal    reports that she has been smoking Cigarettes.  She has been smoking about 1.00 pack per day. She has never used smokeless tobacco. She reports that she does not drink alcohol or use drugs.  Past Medical History:  Diagnosis Date  . Anal fissure 1994  . Anxiety   . Arthritis    [facility]  . Depression   . Endometriosis   . Fracture 08/2011   fracture left wrist and elbow  . Hemorrhoids 2008  . Hepatomegaly   . Hypothyroidism   . Mixed hyperlipidemia   . Obesity   . OCD (obsessive compulsive disorder)   . Panic disorder   . Plantar fasciitis, right 03/2014  . RLS (restless legs syndrome) 2014   vis sleep study- no sleep apnea  . Tobacco abuse    not tolerated Chantix in the past  . Vitamin D deficiency     Past Surgical History:  Procedure Laterality Date  . CHOLECYSTECTOMY    . COLONOSCOPY  03/25/2007   small external hemorroids  . HEMORROIDECTOMY    . LEFT OOPHORECTOMY    . TONSILLECTOMY      Current Outpatient Prescriptions  Medication Sig Dispense Refill  . clonazePAM  (KLONOPIN) 1 MG tablet Take 2 mg by mouth 3 (three) times daily. Take 1/2 by mouth every am, 1/2 at lunch, and 1 every night    . gabapentin (NEURONTIN) 300 MG capsule Take 1 capsule (300 mg total) by mouth 3 (three) times daily. (Patient taking differently: Take by mouth as needed. 100 mg as needed) 90 capsule 2  . ibuprofen (ADVIL,MOTRIN) 200 MG tablet Take 200 mg by mouth every 6 (six) hours as needed.    Marland Kitchen. levothyroxine (SYNTHROID, LEVOTHROID) 75 MCG tablet TAKE ONE TABLET BY MOUTH ONCE DAILY 30 tablet 1  . pravastatin (PRAVACHOL) 40 MG tablet TAKE ONE TABLET BY MOUTH ONCE DAILY 90 tablet 3  . sertraline (ZOLOFT) 100 MG tablet Take 200 mg by mouth 2 (two) times daily.     . Vitamin D, Ergocalciferol, (DRISDOL) 50000 units CAPS capsule Take 50,000 Units by mouth 2 (two) times a week.     No current facility-administered medications for this visit.     Family History  Problem Relation Age of Onset  . Breast cancer Mother   . Diabetes Mother   . Drug abuse Brother     and alcohol  . Cirrhosis Father     alcohol  . Heart disease Father     MI  .  Colon cancer Maternal Grandmother 50  . Alcohol abuse Sister   . Thyroid disease Neg Hx     ROS:  Pertinent items are noted in HPI.  Otherwise, a comprehensive ROS was negative.  Exam:   BP 110/68 (BP Location: Right Arm, Patient Position: Sitting, Cuff Size: Normal)   Pulse 72   Resp 14   Ht 5\' 8"  (1.727 m)   Wt 215 lb (97.5 kg)   LMP 08/04/2012   BMI 32.69 kg/m   Weight change: stable   Height: 5\' 8"  (172.7 cm)  Ht Readings from Last 3 Encounters:  11/04/16 5\' 8"  (1.727 m)  09/16/16 5' 6.75" (1.695 m)  09/15/16 5\' 9"  (1.753 m)    General appearance: alert, cooperative and appears stated age Head: Normocephalic, without obvious abnormality, atraumatic Neck: no adenopathy, supple, symmetrical, trachea midline and thyroid normal to inspection and palpation Lungs: clear to auscultation bilaterally Breasts: normal appearance, no  masses or tenderness Heart: regular rate and rhythm Abdomen: soft, non-tender; bowel sounds normal; no masses,  no organomegaly Extremities: extremities normal, atraumatic, no cyanosis or edema Skin: Skin color, texture, turgor normal. No rashes or lesions Lymph nodes: Cervical, supraclavicular, and axillary nodes normal. No abnormal inguinal nodes palpated Neurologic: Grossly normal  Pelvic: External genitalia:  no lesions              Urethra:  normal appearing urethra with no masses, tenderness or lesions              Bartholins and Skenes: normal                 Vagina: normal appearing vagina with normal color and discharge, no lesions              Cervix: no lesions              Pap taken: Yes.   Bimanual Exam:  Uterus:  normal size, contour, position, consistency, mobility, non-tender              Adnexa: normal adnexa and no mass, fullness, tenderness               Rectovaginal: Confirms               Anus:  normal sphincter tone, no lesions  Chaperone was present for exam.  A:      Well Woman with normal exam  PMP, no HRT  Smoker  Anxiety/Panic disorder  Hypothyroidism  Grief reaction with death of mother  P: Mammogram guidelines reviewed. Pap and HR HPV obtained today Vit D, TSH and CMP, Lipids obtained Vit D 50K weekly.  Rx to pharmacy. return annually or prn  Pt requests lab work to go to Dr. Jennelle Human and Dr. Astrid Divine

## 2016-11-04 ENCOUNTER — Ambulatory Visit (INDEPENDENT_AMBULATORY_CARE_PROVIDER_SITE_OTHER): Payer: Medicare Other | Admitting: Obstetrics & Gynecology

## 2016-11-04 ENCOUNTER — Encounter: Payer: Self-pay | Admitting: Obstetrics & Gynecology

## 2016-11-04 VITALS — BP 110/68 | HR 72 | Resp 14 | Ht 68.0 in | Wt 215.0 lb

## 2016-11-04 DIAGNOSIS — Z0000 Encounter for general adult medical examination without abnormal findings: Secondary | ICD-10-CM

## 2016-11-04 DIAGNOSIS — Z124 Encounter for screening for malignant neoplasm of cervix: Secondary | ICD-10-CM | POA: Diagnosis not present

## 2016-11-04 DIAGNOSIS — Z01419 Encounter for gynecological examination (general) (routine) without abnormal findings: Secondary | ICD-10-CM | POA: Diagnosis not present

## 2016-11-04 LAB — POCT URINALYSIS DIPSTICK
Bilirubin, UA: NEGATIVE
Blood, UA: NEGATIVE
GLUCOSE UA: NEGATIVE
Ketones, UA: NEGATIVE
Leukocytes, UA: NEGATIVE
NITRITE UA: NEGATIVE
Protein, UA: NEGATIVE
UROBILINOGEN UA: NEGATIVE
pH, UA: 5

## 2016-11-04 LAB — LIPID PANEL
CHOL/HDL RATIO: 4.2 ratio (ref <5.0)
CHOLESTEROL: 166 mg/dL (ref <200)
HDL: 40 mg/dL — AB (ref >50)
LDL CALC: 84 mg/dL (ref <100)
TRIGLYCERIDES: 210 mg/dL — AB (ref <150)
VLDL: 42 mg/dL — AB (ref <30)

## 2016-11-04 LAB — COMPREHENSIVE METABOLIC PANEL
ALBUMIN: 4.3 g/dL (ref 3.6–5.1)
ALK PHOS: 81 U/L (ref 33–130)
ALT: 11 U/L (ref 6–29)
AST: 15 U/L (ref 10–35)
BUN: 3 mg/dL — ABNORMAL LOW (ref 7–25)
CO2: 26 mmol/L (ref 20–31)
Calcium: 10.1 mg/dL (ref 8.6–10.4)
Chloride: 101 mmol/L (ref 98–110)
Creat: 0.69 mg/dL (ref 0.50–1.05)
Glucose, Bld: 108 mg/dL — ABNORMAL HIGH (ref 65–99)
Potassium: 4.1 mmol/L (ref 3.5–5.3)
SODIUM: 138 mmol/L (ref 135–146)
TOTAL PROTEIN: 6.7 g/dL (ref 6.1–8.1)
Total Bilirubin: 0.4 mg/dL (ref 0.2–1.2)

## 2016-11-04 LAB — TSH: TSH: 5.93 mIU/L — ABNORMAL HIGH

## 2016-11-04 MED ORDER — VITAMIN D (ERGOCALCIFEROL) 1.25 MG (50000 UNIT) PO CAPS: 50000.0000 [IU] | ORAL_CAPSULE | ORAL | 4 refills | Status: DC

## 2016-11-05 LAB — VITAMIN D 25 HYDROXY (VIT D DEFICIENCY, FRACTURES): Vit D, 25-Hydroxy: 27 ng/mL — ABNORMAL LOW (ref 30–100)

## 2016-11-05 LAB — IPS PAP TEST WITH HPV

## 2016-11-07 ENCOUNTER — Telehealth: Payer: Self-pay | Admitting: Obstetrics & Gynecology

## 2016-11-07 NOTE — Telephone Encounter (Signed)
Routing to Dr.Miller for review and advise of labs from 11/04/2016. Patient had lipid panel triglycerides 210, HDL 40. TSH 5.93. Vitamin D 27. CMP Glucose 108, BUN 3. Pap normal HPV not detected, shift in flora suggestive of BV. Please advise recommendations regarding results. Okay to treat for BV if symptomatic?

## 2016-11-07 NOTE — Telephone Encounter (Signed)
Patient would like her lab results called to her as soon as they are reviewed.

## 2016-11-14 ENCOUNTER — Other Ambulatory Visit: Payer: Self-pay | Admitting: Obstetrics & Gynecology

## 2016-11-14 NOTE — Telephone Encounter (Signed)
Medication refill request: Levothyroxine Last AEX:  11/04/16 SM Next AEX: 02/02/18 SM Last MMG (if hormonal medication request): 02/08/16 BIRADS 2 benign Refill authorized: 09/08/16 #30 1R. Please advise. Thank you.

## 2016-11-18 NOTE — Telephone Encounter (Signed)
Ok to treat with metrogel 0.75% nightly for 5 nights or flagyl 500mg  bid x 7 days.  If uses metrogel, also send in Rx for Diflucan 150mg  po x 1 to be used at end of treatment as metrogel can cause yeast.  Pt's TSH was mildly elevated.  She has seen endocrinology in the past and should follow up for repeat and any medication adjustment that would be appropriate.    Glucose was 108.  Can you confirm if she was fasting.  If so, should do HbA1C with next lab work to assess for diabetes.    Vit D 27.  Continue on prescription dosage.    Lipids show triglycerides of 210.  This is usually related to sugar intake and will improve with weight loss that she is working on at this time.  No issues with BUN 3.  This is fine.  CC:  Arnold LongEmily Caldwell.  02 pap recall.

## 2016-11-19 DIAGNOSIS — M5442 Lumbago with sciatica, left side: Secondary | ICD-10-CM | POA: Diagnosis not present

## 2016-11-19 MED ORDER — METRONIDAZOLE 0.75 % VA GEL: 1.0000 | Freq: Every day | VAGINAL | 0 refills | Status: DC

## 2016-11-19 MED ORDER — FLUCONAZOLE 150 MG PO TABS: 150.0000 mg | ORAL_TABLET | Freq: Once | ORAL | 0 refills | Status: AC

## 2016-11-19 NOTE — Telephone Encounter (Signed)
Spoke with patient. Advised of message as seen below from Dr.Miller. Patient verbalizes understanding.    Patient states she is having vaginal odor and discomfort. Would like to start Metrogel. Rx for Metrogel 0.75 % nightly for 5 nights sent to pharmacy on file. Rx for Diflucan 150 mg po x 1 take 1 tablet at the end of metrogel treatment sent to pharmacy on file. Patient verbalizes understanding.   Patient states that the last time she was seen with Endocrinology she was advised "Everything was normal and Dr.Miller could manage my medicine. Dr.Miller is prescribing it. Can she not adjust it or do I have to go back and see the endocrinologist? He just didn't help."  Patient states that she had been drinking Diet [facility] and ate a few peanut butter crackers before coming for her appointment.   Patient will continue taking Vitamin D rx was sent to pharmacy on 11/04/2016 by Dr.Miller.   Patient would like referral to Nutritionist to help with weight loss and diet management.  Requesting labs be sent to Dr.McKeown as he has been following her for "low sodium level". Aware Dr.McKeown is in [facility] and has access to results. Results sent to Dr.McKeown via [facility] as well. Patient is agreeable.  02 recall in.  Dr.Miller, please review regarding endocrinology and nutrition referral.

## 2016-11-19 NOTE — Telephone Encounter (Signed)
Left message to call Kaitlyn at [phone]. 

## 2016-11-21 ENCOUNTER — Other Ambulatory Visit: Payer: Self-pay | Admitting: Obstetrics & Gynecology

## 2016-11-21 DIAGNOSIS — R7989 Other specified abnormal findings of blood chemistry: Secondary | ICD-10-CM

## 2016-11-21 DIAGNOSIS — R739 Hyperglycemia, unspecified: Secondary | ICD-10-CM

## 2016-11-21 DIAGNOSIS — Z6832 Body mass index (BMI) 32.0-32.9, adult: Secondary | ICD-10-CM

## 2016-11-21 NOTE — Telephone Encounter (Signed)
Left message to call Susan Bartlett at [phone]. 

## 2016-11-21 NOTE — Telephone Encounter (Signed)
Spoke with patient. Advised of message as seen below from Dr.Miller. Patient is agreeable and verbalizes understanding. Lab appointment scheduled for 01/20/2017 at 1:30 pm. Patient is agreeable to date and time.  Routing to provider for final review. Patient agreeable to disposition. Will close encounter.

## 2016-11-21 NOTE — Telephone Encounter (Signed)
OK, I will manage it for now.  I'd like to repeat the TSH and also check a HbA1C in two months.  Lab order has been placed.

## 2016-11-28 DIAGNOSIS — M5442 Lumbago with sciatica, left side: Secondary | ICD-10-CM | POA: Diagnosis not present

## 2016-12-30 DIAGNOSIS — E871 Hypo-osmolality and hyponatremia: Secondary | ICD-10-CM | POA: Diagnosis not present

## 2017-01-20 ENCOUNTER — Other Ambulatory Visit: Payer: Medicare Other

## 2017-01-21 ENCOUNTER — Other Ambulatory Visit: Payer: Medicare Other

## 2017-01-26 ENCOUNTER — Encounter: Payer: Self-pay | Admitting: Physician Assistant

## 2017-01-28 ENCOUNTER — Telehealth: Payer: Self-pay | Admitting: Obstetrics & Gynecology

## 2017-01-28 ENCOUNTER — Other Ambulatory Visit: Payer: Medicare Other

## 2017-01-28 NOTE — Telephone Encounter (Signed)
Left message to reschedule lab appointment from today.

## 2017-01-29 NOTE — Telephone Encounter (Signed)
Thank you.  Ok to close encounter. 

## 2017-06-09 ENCOUNTER — Other Ambulatory Visit: Payer: Self-pay

## 2017-06-09 ENCOUNTER — Encounter: Payer: Self-pay | Admitting: Physician Assistant

## 2017-06-09 ENCOUNTER — Ambulatory Visit (INDEPENDENT_AMBULATORY_CARE_PROVIDER_SITE_OTHER): Payer: Medicare Other | Admitting: Physician Assistant

## 2017-06-09 VITALS — BP 114/76 | HR 80 | Temp 97.2°F | Resp 16 | Ht 68.0 in | Wt 205.8 lb

## 2017-06-09 DIAGNOSIS — F325 Major depressive disorder, single episode, in full remission: Secondary | ICD-10-CM

## 2017-06-09 DIAGNOSIS — E039 Hypothyroidism, unspecified: Secondary | ICD-10-CM | POA: Diagnosis not present

## 2017-06-09 DIAGNOSIS — R2689 Other abnormalities of gait and mobility: Secondary | ICD-10-CM

## 2017-06-09 DIAGNOSIS — E559 Vitamin D deficiency, unspecified: Secondary | ICD-10-CM

## 2017-06-09 DIAGNOSIS — R7303 Prediabetes: Secondary | ICD-10-CM

## 2017-06-09 DIAGNOSIS — E782 Mixed hyperlipidemia: Secondary | ICD-10-CM | POA: Diagnosis not present

## 2017-06-09 DIAGNOSIS — R1084 Generalized abdominal pain: Secondary | ICD-10-CM | POA: Diagnosis not present

## 2017-06-09 DIAGNOSIS — F17200 Nicotine dependence, unspecified, uncomplicated: Secondary | ICD-10-CM

## 2017-06-09 DIAGNOSIS — D649 Anemia, unspecified: Secondary | ICD-10-CM

## 2017-06-09 DIAGNOSIS — Z79899 Other long term (current) drug therapy: Secondary | ICD-10-CM

## 2017-06-09 MED ORDER — PRAVASTATIN SODIUM 40 MG PO TABS: 40.0000 mg | ORAL_TABLET | Freq: Every day | ORAL | 3 refills | Status: DC

## 2017-06-09 NOTE — Progress Notes (Signed)
Patient ID: Susan Bartlett, female   DOB: 1966/06/25, 51 y.o.   MRN: [id number] Assessment and Plan:   Hypertension -Continue medication, monitor blood pressure at home and call if consistently above 140/90. Continue DASH diet.  Reminder to go to the ER if any CP, SOB, nausea, dizziness, severe HA, changes vision/speech, left arm numbness and tingling and jaw pain.  Cholesterol -Continue diet and exercise. Check cholesterol.    Prediabetes  -Continue diet and exercise. Check A1C  Vitamin D Def - check level and continue medications.  On 50,000 IU daily  Imbalance Talk with ortho able PT Treat ear effusion Normal neuro  AB bloating Normal colonoscopy Benign AB exam Suggest diet adjustments, if not better or if new symptoms will get CT AB  Continue diet and meds as discussed. Further disposition pending results of labs. Over 30 minutes of exam, counseling, chart review, and critical decision making was performed  HPI 51 y.o. female  presents for 3 month follow up on hypertension, cholesterol, prediabetes, and vitamin D deficiency.   Her blood pressure has been controlled at home, today their BP is BP: 114/76  She does not workout. She denies chest pain, shortness of breath, dizziness. She complains of AB bloating, occ with have Bm that helps. She had normal colonoscopy 2016. Ct AB pelvis 2013 showed hepatomegaly.   She is on cholesterol medication and denies myalgias. Her cholesterol is at goal. The cholesterol last visit was:   Lab Results  Component Value Date   CHOL 166 11/04/2016   HDL 40 (L) 11/04/2016   LDLCALC 84 11/04/2016   TRIG 210 (H) 11/04/2016   CHOLHDL 4.2 11/04/2016    Last A1C in the office was:  Lab Results  Component Value Date   HGBA1C 5.2 07/09/2016   Patient is on Vitamin D supplement, 50,000 IU was added twice a week.   Lab Results  Component Value Date   VD25OH 27 (L) 11/04/2016    She is on thyroid medication. Her medication was changed last  visit, she was put back on once a day.    Lab Results  Component Value Date   TSH 5.93 (H) 11/04/2016   She has very BMI is Body mass index is 31.29 kg/m., she is working on diet and exercise. Wt Readings from Last 3 Encounters:  06/09/17 205 lb 12.8 oz (93.4 kg)  11/04/16 215 lb (97.5 kg)  09/16/16 220 lb 6.4 oz (100 kg)   She has imbalance, had normal MRI 2011, she has known lower back pain. Normal stress test 2014.    Current Medications:  Current Outpatient Prescriptions on File Prior to Visit  Medication Sig Dispense Refill  . clonazePAM (KLONOPIN) 1 MG tablet Take 2 mg by mouth 3 (three) times daily. Take 1/2 by mouth every am, 1/2 at lunch, and 1 every night    . ibuprofen (ADVIL,MOTRIN) 200 MG tablet Take 200 mg by mouth every 6 (six) hours as needed.    Marland Kitchen levothyroxine (SYNTHROID, LEVOTHROID) 75 MCG tablet TAKE ONE TABLET BY MOUTH ONCE DAILY 30 tablet 12  . sertraline (ZOLOFT) 100 MG tablet Take 200 mg by mouth 2 (two) times daily.     . Vitamin D, Ergocalciferol, (DRISDOL) 50000 units CAPS capsule Take 1 capsule (50,000 Units total) by mouth 2 (two) times a week. 12 capsule 4   No current facility-administered medications on file prior to visit.    Medical History:  Past Medical History:  Diagnosis Date  . Anal fissure 1994  .  Anxiety   . Arthritis    Dr. Penni Bombard at [facility]  . Depression   . Endometriosis   . Fracture 08/2011   fracture left wrist and elbow  . Hemorrhoids 2008  . Hepatomegaly   . Hypothyroidism   . Mixed hyperlipidemia   . Obesity   . OCD (obsessive compulsive disorder)   . Panic disorder   . Plantar fasciitis, right 03/2014  . RLS (restless legs syndrome) 2014   vis sleep study- no sleep apnea  . Tobacco abuse    not tolerated Chantix in the past  . Vitamin D deficiency    Allergies: No Known Allergies   Review of Systems:  Review of Systems  Constitutional: Negative for chills, diaphoresis, fever, malaise/fatigue and  weight loss.  Eyes: Negative.   Respiratory: Negative.   Cardiovascular: Negative.   Gastrointestinal: Negative for abdominal pain, blood in stool, constipation, diarrhea, melena and vomiting.  Genitourinary: Negative.   Musculoskeletal: Positive for back pain, joint pain and neck pain. Negative for falls and myalgias.  Skin: Negative.  Negative for rash.  Neurological: Negative.  Negative for weakness.  Psychiatric/Behavioral: Negative for depression, hallucinations, memory loss, substance abuse and suicidal ideas. The patient is not nervous/anxious and does not have insomnia.     Family history- Review and unchanged Social history- Review and unchanged Physical Exam: BP 114/76   Pulse 80   Temp (!) 97.2 F (36.2 C)   Resp 16   Ht 5\' 8"  (1.727 m)   Wt 205 lb 12.8 oz (93.4 kg)   LMP 08/04/2012   SpO2 97%   BMI 31.29 kg/m  Wt Readings from Last 3 Encounters:  06/09/17 205 lb 12.8 oz (93.4 kg)  11/04/16 215 lb (97.5 kg)  09/16/16 220 lb 6.4 oz (100 kg)   General Appearance: Well nourished, in no apparent distress. Eyes: PERRLA, EOMs, conjunctiva no swelling or erythema Sinuses: No Frontal/maxillary tenderness ENT/Mouth: Ext aud canals clear, TMs without erythema, bulging. No erythema, swelling, or exudate on post pharynx.  Tonsils not swollen or erythematous. Hearing normal.  Neck: Supple, thyroid normal.  Respiratory: Respiratory effort normal, BS equal bilaterally without rales, rhonchi, wheezing or stridor.  Cardio: RRR with no MRGs. Brisk peripheral pulses without edema.  Abdomen: Soft, + BS,  diffusely tender, no guarding, rebound, hernias, masses. Lymphatics: Non tender without lymphadenopathy.  Musculoskeletal: Full ROM, 5/5 strength, Normal gait Skin: Warm, dry without rashes, lesions, ecchymosis.  Neuro: Cranial nerves intact. Normal muscle tone, no cerebellar symptoms. Psych: Awake and oriented X 3, normal affect, Insight and Judgment appropriate.    Quentin Mulling, PA-C 11:16 AM [facility]

## 2017-06-09 NOTE — Patient Instructions (Addendum)
Can try probiotics, activia, or kabucha Can do apple cider vinegar  Your ears and sinuses are connected by the eustachian tube. When your sinuses are inflamed, this can close off the tube and cause fluid to collect in your middle ear. This can then cause dizziness, popping, clicking, ringing, and echoing in your ears. This is often NOT an infection and does NOT require antibiotics, it is caused by inflammation so the treatments help the inflammation. This can take a long time to get better so please be patient.  Here are things you can do to help with this: - Try the Flonase or Nasonex. Remember to spray each nostril twice towards the outer part of your eye.  Do not sniff but instead pinch your nose and tilt your head back to help the medicine get into your sinuses.  The best time to do this is at bedtime.Stop if you get blurred vision or nose bleeds.  -While drinking fluids, pinch and hold nose close and swallow, to help open eustachian tubes to drain fluid behind ear drums. -Please pick one of the over the counter allergy medications below and take it once daily for allergies.  It will also help with fluid behind ear drums. Claritin or loratadine cheapest but likely the weakest  Zyrtec or certizine at night because it can make you sleepy The strongest is allegra or fexafinadine  Cheapest at [facility], [employer], [facility] -can use decongestant over the counter, please do not use if you have high blood pressure or certain heart conditions.   if worsening HA, changes vision/speech, imbalance, weakness go to the ER   Check out FODMAP diet see print out  Keep food diary  Drink 80-100 oz a day of water, measure it out Eat 3 meals a day, have to do breakfast, eat protein- hard boiled eggs, protein bar like nature [place] protein bar, greek yogurt like oikos triple zero, chobani 100, or light n fit greek  We want weight loss that will last so you should lose 1-2 pounds a week.  THAT IS IT! Please pick THREE  things a month to change. Once it is a habit check off the item. Then pick another three items off the list to become habits.  If you are already doing a habit on the list GREAT!  Cross that item off! o Don't drink your calories. Ie, alcohol, soda, fruit juice, and sweet tea.  o Drink more water. Drink a glass when you feel hungry or before each meal.  o Eat breakfast - Complex carb and protein (likeDannon light and fit yogurt, oatmeal, fruit, eggs, [place] bacon). o Measure your cereal.  Eat no more than one cup a day. (ie [place]) o Eat an apple a day. o Add a vegetable a day. o Try a new vegetable a month. o Use Pam! Stop using oil or butter to cook. o Don't finish your plate or use smaller plates. o Share your dessert. o Eat sugar free Jello for dessert or frozen grapes. o Don't eat 2-3 hours before bed. o Switch to whole wheat bread, pasta, and brown rice. o Make healthier choices when you eat out. No fries! o Pick baked chicken, NOT fried. o Don't forget to SLOW DOWN when you eat. It is not going anywhere.  o Take the stairs. o Park far away in the parking lot o [employer] (or weights) for 10 minutes while watching TV. o Walk at work for 10 minutes during break. o Walk outside 1 time  a week with your friend, kids, dog, or significant other. o Start a walking group at church. o Walk the mall as much as you can tolerate.  o Keep a food diary. o Weigh yourself daily. o Walk for 15 minutes 3 days per week. o Cook at home more often and eat out less.  If life happens and you go back to old habits, it is okay.  Just start over. You can do it!   If you experience chest pain, get short of breath, or tired during the exercise, please stop immediately and inform your doctor.

## 2017-06-11 LAB — TSH: TSH: 4.96 m[IU]/L — AB

## 2017-06-11 LAB — URINE CULTURE
MICRO NUMBER:: 80939955
SPECIMEN QUALITY:: ADEQUATE

## 2017-06-11 LAB — BASIC METABOLIC PANEL WITH GFR
BUN: 11 mg/dL (ref 7–25)
CO2: 26 mmol/L (ref 20–32)
Calcium: 10.1 mg/dL (ref 8.6–10.4)
Chloride: 103 mmol/L (ref 98–110)
Creat: 0.82 mg/dL (ref 0.50–1.05)
GFR, EST NON AFRICAN AMERICAN: 82 mL/min/{1.73_m2} (ref >=60)
GFR, Est African American: 95 mL/min/{1.73_m2} (ref >=60)
GLUCOSE: 94 mg/dL (ref 65–99)
Potassium: 4.6 mmol/L (ref 3.5–5.3)
SODIUM: 139 mmol/L (ref 135–146)

## 2017-06-11 LAB — LIPID PANEL
CHOLESTEROL: 205 mg/dL — AB (ref <200)
HDL: 53 mg/dL (ref >50)
LDL Cholesterol (Calc): 129 mg/dL (calc) — ABNORMAL HIGH
Non-HDL Cholesterol (Calc): 152 mg/dL (calc) — ABNORMAL HIGH (ref <130)
Total CHOL/HDL Ratio: 3.9 (calc) (ref <5.0)
Triglycerides: 124 mg/dL (ref <150)

## 2017-06-11 LAB — HEPATIC FUNCTION PANEL
AG RATIO: 1.9 (calc) (ref 1.0–2.5)
ALT: 15 U/L (ref 6–29)
AST: 18 U/L (ref 10–35)
Albumin: 4.6 g/dL (ref 3.6–5.1)
Alkaline phosphatase (APISO): 104 U/L (ref 33–130)
BILIRUBIN DIRECT: 0.1 mg/dL (ref 0.0–0.2)
BILIRUBIN INDIRECT: 0.2 mg/dL (ref 0.2–1.2)
BILIRUBIN TOTAL: 0.3 mg/dL (ref 0.2–1.2)
GLOBULIN: 2.4 g/dL (ref 1.9–3.7)
TOTAL PROTEIN: 7 g/dL (ref 6.1–8.1)

## 2017-06-11 LAB — CBC WITH DIFFERENTIAL/PLATELET
Basophils Absolute: 70 cells/uL (ref 0–200)
Basophils Relative: 1.1 %
EOS PCT: 8.6 %
Eosinophils Absolute: 550 cells/uL — ABNORMAL HIGH (ref 15–500)
HCT: 42.8 % (ref 35.0–45.0)
HEMOGLOBIN: 14.5 g/dL (ref 11.7–15.5)
Lymphs Abs: 1523 cells/uL (ref 850–3900)
MCH: 30.4 pg (ref 27.0–33.0)
MCHC: 33.9 g/dL (ref 32.0–36.0)
MCV: 89.7 fL (ref 80.0–100.0)
MONOS PCT: 6.7 %
MPV: 10.8 fL (ref 7.5–12.5)
NEUTROS ABS: 3827 {cells}/uL (ref 1500–7800)
Neutrophils Relative %: 59.8 %
PLATELETS: 287 10*3/uL (ref 140–400)
RBC: 4.77 10*6/uL (ref 3.80–5.10)
RDW: 12.7 % (ref 11.0–15.0)
TOTAL LYMPHOCYTE: 23.8 %
WBC mixed population: 429 cells/uL (ref 200–950)
WBC: 6.4 10*3/uL (ref 3.8–10.8)

## 2017-06-11 LAB — HEMOGLOBIN A1C
EAG (MMOL/L): 5.5 (calc)
Hgb A1c MFr Bld: 5.1 % of total Hgb (ref <5.7)
MEAN PLASMA GLUCOSE: 100 (calc)

## 2017-06-11 LAB — EXTRA URINE SPECIMEN

## 2017-06-11 LAB — URINALYSIS, ROUTINE W REFLEX MICROSCOPIC
Bacteria, UA: NONE SEEN /HPF
Bilirubin Urine: NEGATIVE
GLUCOSE, UA: NEGATIVE
HYALINE CAST: NONE SEEN /LPF
Ketones, ur: NEGATIVE
Leukocytes, UA: NEGATIVE
Nitrite: NEGATIVE
PH: 5.5 (ref 5.0–8.0)
Protein, ur: NEGATIVE
Specific Gravity, Urine: 1.01 (ref 1.001–1.03)
Squamous Epithelial / LPF: NONE SEEN /HPF (ref <=5)
WBC, UA: NONE SEEN /HPF (ref 0–5)

## 2017-06-11 LAB — MAGNESIUM: MAGNESIUM: 2.1 mg/dL (ref 1.5–2.5)

## 2017-06-11 LAB — IRON, TOTAL/TOTAL IRON BINDING CAP
%SAT: 14 % (calc) (ref 11–50)
IRON: 54 ug/dL (ref 45–160)
TIBC: 397 ug/dL (ref 250–450)

## 2017-06-11 LAB — VITAMIN B12: Vitamin B-12: 325 pg/mL (ref 200–1100)

## 2017-06-11 LAB — VITAMIN D 25 HYDROXY (VIT D DEFICIENCY, FRACTURES): VIT D 25 HYDROXY: 17 ng/mL — AB (ref 30–100)

## 2017-07-03 ENCOUNTER — Encounter: Payer: Self-pay | Admitting: Physician Assistant

## 2017-07-04 ENCOUNTER — Encounter: Payer: Self-pay | Admitting: Internal Medicine

## 2017-07-04 DIAGNOSIS — B029 Zoster without complications: Secondary | ICD-10-CM | POA: Diagnosis not present

## 2017-07-05 ENCOUNTER — Other Ambulatory Visit: Payer: Self-pay | Admitting: Internal Medicine

## 2017-07-05 ENCOUNTER — Encounter: Payer: Self-pay | Admitting: Internal Medicine

## 2017-07-05 MED ORDER — GABAPENTIN 100 MG PO CAPS: ORAL_CAPSULE | ORAL | 1 refills | Status: DC

## 2017-07-05 MED ORDER — GABAPENTIN 300 MG PO CAPS
300.0000 mg | ORAL_CAPSULE | Freq: Three times a day (TID) | ORAL | 2 refills | Status: DC
Start: 2017-07-05 — End: 2017-07-08

## 2017-07-06 ENCOUNTER — Encounter: Payer: Self-pay | Admitting: Internal Medicine

## 2017-07-08 ENCOUNTER — Encounter: Payer: Self-pay | Admitting: Physician Assistant

## 2017-07-08 ENCOUNTER — Ambulatory Visit (INDEPENDENT_AMBULATORY_CARE_PROVIDER_SITE_OTHER): Payer: Medicare Other | Admitting: Physician Assistant

## 2017-07-08 VITALS — BP 116/74 | HR 86 | Temp 97.3°F | Resp 14 | Ht 68.0 in | Wt 200.2 lb

## 2017-07-08 DIAGNOSIS — B029 Zoster without complications: Secondary | ICD-10-CM

## 2017-07-08 LAB — CBC WITH DIFFERENTIAL/PLATELET
BASOS PCT: 1 %
Basophils Absolute: 80 cells/uL (ref 0–200)
EOS PCT: 8.9 %
Eosinophils Absolute: 712 cells/uL — ABNORMAL HIGH (ref 15–500)
HEMATOCRIT: 41.6 % (ref 35.0–45.0)
HEMOGLOBIN: 14.2 g/dL (ref 11.7–15.5)
LYMPHS ABS: 2400 {cells}/uL (ref 850–3900)
MCH: 30.2 pg (ref 27.0–33.0)
MCHC: 34.1 g/dL (ref 32.0–36.0)
MCV: 88.5 fL (ref 80.0–100.0)
MPV: 10.6 fL (ref 7.5–12.5)
Monocytes Relative: 6.2 %
NEUTROS ABS: 4312 {cells}/uL (ref 1500–7800)
NEUTROS PCT: 53.9 %
Platelets: 311 10*3/uL (ref 140–400)
RBC: 4.7 10*6/uL (ref 3.80–5.10)
RDW: 12.8 % (ref 11.0–15.0)
Total Lymphocyte: 30 %
WBC mixed population: 496 cells/uL (ref 200–950)
WBC: 8 10*3/uL (ref 3.8–10.8)

## 2017-07-08 LAB — BASIC METABOLIC PANEL WITH GFR
BUN / CREAT RATIO: 8 (calc) (ref 6–22)
BUN: 6 mg/dL — AB (ref 7–25)
CO2: 22 mmol/L (ref 20–32)
CREATININE: 0.74 mg/dL (ref 0.50–1.05)
Calcium: 9.8 mg/dL (ref 8.6–10.4)
Chloride: 101 mmol/L (ref 98–110)
GFR, EST AFRICAN AMERICAN: 108 mL/min/{1.73_m2} (ref >=60)
GFR, EST NON AFRICAN AMERICAN: 93 mL/min/{1.73_m2} (ref >=60)
Glucose, Bld: 95 mg/dL (ref 65–99)
Potassium: 4.2 mmol/L (ref 3.5–5.3)
Sodium: 133 mmol/L — ABNORMAL LOW (ref 135–146)

## 2017-07-08 LAB — HEPATIC FUNCTION PANEL
AG RATIO: 2 (calc) (ref 1.0–2.5)
ALT: 13 U/L (ref 6–29)
AST: 14 U/L (ref 10–35)
Albumin: 4.7 g/dL (ref 3.6–5.1)
Alkaline phosphatase (APISO): 88 U/L (ref 33–130)
BILIRUBIN DIRECT: 0.1 mg/dL (ref 0.0–0.2)
BILIRUBIN INDIRECT: 0.4 mg/dL (ref 0.2–1.2)
GLOBULIN: 2.3 g/dL (ref 1.9–3.7)
Total Bilirubin: 0.5 mg/dL (ref 0.2–1.2)
Total Protein: 7 g/dL (ref 6.1–8.1)

## 2017-07-08 MED ORDER — DEXAMETHASONE 0.5 MG PO TABS: ORAL_TABLET | ORAL | 0 refills | Status: DC

## 2017-07-08 NOTE — Progress Notes (Signed)
   Subjective:    Patient ID: Susan Bartlett, female    DOB: October 08, 1966, 51 y.o.   MRN: [id number]  HPI 51 y.o. WF presents with bumps, numbness, tingling and pain left back.  Went to [facility] 07/04/2017, given valtrex 1gm to take 3 x a day, not on gabapentin at this time but has .   Blood pressure 116/74, pulse 86, temperature (!) 97.3 F (36.3 C), resp. rate 14, height  (1.727 m), weight 200 lb 3.2 oz (90.8 kg), last menstrual period 08/04/2012, SpO2 96 %.  Medications Current Outpatient Prescriptions on File Prior to Visit  Medication Sig  . clonazePAM (KLONOPIN) 1 MG tablet Take 2 mg by mouth 3 (three) times daily. Take 1/2 by mouth every am, 1/2 at lunch, and 1 every night  . gabapentin (NEURONTIN) 100 MG capsule Take 1 capsule 3 x / day for pain  . ibuprofen (ADVIL,MOTRIN) 200 MG tablet Take 200 mg by mouth every 6 (six) hours as needed.  Marland Kitchen levothyroxine (SYNTHROID, LEVOTHROID) 75 MCG tablet TAKE ONE TABLET BY MOUTH ONCE DAILY  . pravastatin (PRAVACHOL) 40 MG tablet Take 1 tablet (40 mg total) by mouth daily.  . sertraline (ZOLOFT) 100 MG tablet Take 200 mg by mouth 2 (two) times daily.   . Vitamin D, Ergocalciferol, (DRISDOL) 50000 units CAPS capsule Take 1 capsule (50,000 Units total) by mouth 2 (two) times a week.   No current facility-administered medications on file prior to visit.     Problem list She has Hepatomegaly; Mixed hyperlipidemia; Panic disorder; Anal fissure; Depression, major, in remission (HCC); Hypothyroidism; Obesity; Vitamin D deficiency; Tobacco use disorder; Prediabetes; and TMJ (dislocation of temporomandibular joint) on her problem list.   Review of Systems  Constitutional: Negative.  Negative for chills and fever.  HENT: Negative.   Respiratory: Negative.   Cardiovascular: Negative.   Gastrointestinal: Negative.  Negative for diarrhea.  Genitourinary: Negative.   Musculoskeletal: Negative.  Negative for arthralgias.  Skin: Positive for rash and  wound. Negative for color change.  Neurological: Negative.  Negative for dizziness.       Objective:   Physical Exam  Constitutional: She is oriented to person, place, and time. She appears well-developed and well-nourished.  HENT:  Head: Normocephalic and atraumatic.  Eyes: Pupils are equal, round, and reactive to light. Conjunctivae are normal.  Neck: Normal range of motion. Neck supple.  Cardiovascular: Normal rate and regular rhythm.   Pulmonary/Chest: Effort normal and breath sounds normal.  Abdominal: Soft. Bowel sounds are normal. There is no tenderness.  Musculoskeletal: Normal range of motion. She exhibits no edema or tenderness.  Lymphadenopathy:    She has no cervical adenopathy.  Neurological: She is alert and oriented to person, place, and time. She has normal reflexes.  Skin: Skin is warm and dry. Rash noted.  Erythematous vesicles along L1-L2 dermatome on right side       Assessment & Plan:    Herpes zoster without complication -     CBC with Differential/Platelet -     BASIC METABOLIC PANEL WITH GFR -     Hepatic function panel -     dexamethasone (DECADRON) 0.5 MG tablet; Take  1 tablet PO BID for 3 days, then take 1 tablet PO for 5 days. - finish valtrex - The patient was informed that the vesicles can cause chicken pox in a person that is not immune, will follow up PRN.

## 2017-07-08 NOTE — Patient Instructions (Signed)
Your ears and sinuses are connected by the eustachian tube. When your sinuses are inflamed, this can close off the tube and cause fluid to collect in your middle ear. This can then cause dizziness, popping, clicking, ringing, and echoing in your ears. This is often NOT an infection and does NOT require antibiotics, it is caused by inflammation so the treatments help the inflammation. This can take a long time to get better so please be patient.  Here are things you can do to help with this: - Try the Flonase or Nasonex. Remember to spray each nostril twice towards the outer part of your eye.  Do not sniff but instead pinch your nose and tilt your head back to help the medicine get into your sinuses.  The best time to do this is at bedtime.Stop if you get blurred vision or nose bleeds.  -While drinking fluids, pinch and hold nose close and swallow, to help open eustachian tubes to drain fluid behind ear drums. -Please pick one of the over the counter allergy medications below and take it once daily for allergies.  It will also help with fluid behind ear drums. Claritin or loratadine cheapest but likely the weakest  Zyrtec or certizine at night because it can make you sleepy The strongest is allegra or fexafinadine  Cheapest at [facility], [facility], [facility] -can use decongestant over the counter, please do not use if you have high blood pressure or certain heart conditions.   if worsening HA, changes vision/speech, imbalance, weakness go to the ER    Shingles Shingles, which is also known as herpes zoster, is an infection that causes a painful skin rash and fluid-filled blisters. Shingles is not related to genital herpes, which is a sexually transmitted infection. Shingles only develops in people who:  Have had chickenpox.  Have received the chickenpox vaccine. (This is rare.)  What are the causes? Shingles is caused by varicella-zoster virus (VZV). This is the same virus that causes chickenpox. After  exposure to VZV, the virus stays in the body in an inactive (dormant) state. Shingles develops if the virus reactivates. This can happen many years after the initial exposure to VZV. It is not known what causes this virus to reactivate. What increases the risk? People who have had chickenpox or received the chickenpox vaccine are at risk for shingles. Infection is more common in people who:  Are older than age 13.  Have a weakened defense (immune) system, such as those with HIV, AIDS, or cancer.  Are taking medicines that weaken the immune system, such as transplant medicines.  Are under great stress.  What are the signs or symptoms? Early symptoms of this condition include itching, tingling, and pain in an area on your skin. Pain may be described as burning, stabbing, or throbbing. A few days or weeks after symptoms start, a painful red rash appears, usually on one side of the body in a bandlike or beltlike pattern. The rash eventually turns into fluid-filled blisters that break open, scab over, and dry up in about 2-3 weeks. At any time during the infection, you may also develop:  A fever.  Chills.  A headache.  An upset stomach.  How is this diagnosed? This condition is diagnosed with a skin exam. Sometimes, skin or fluid samples are taken from the blisters before a diagnosis is made. These samples are examined under a microscope or sent to a lab for testing. How is this treated? There is no specific cure for this condition.  Your health care provider will probably prescribe medicines to help you manage pain, recover more quickly, and avoid long-term problems. Medicines may include:  Antiviral drugs.  Anti-inflammatory drugs.  Pain medicines.  If the area involved is on your face, you may be referred to a specialist, such as an eye doctor (ophthalmologist) or an ear, nose, and throat (ENT) doctor to help you avoid eye problems, chronic pain, or disability. Follow these  instructions at home: Medicines  Take medicines only as directed by your health care provider.  Apply an anti-itch or numbing cream to the affected area as directed by your health care provider. Blister and Rash Care  Take a cool bath or apply cool compresses to the area of the rash or blisters as directed by your health care provider. This may help with pain and itching.  Keep your rash covered with a loose bandage (dressing). Wear loose-fitting clothing to help ease the pain of material rubbing against the rash.  Keep your rash and blisters clean with mild soap and cool water or as directed by your health care provider.  Check your rash every day for signs of infection. These include redness, swelling, and pain that lasts or increases.  Do not pick your blisters.  Do not scratch your rash. General instructions  Rest as directed by your health care provider.  Keep all follow-up visits as directed by your health care provider. This is important.  Until your blisters scab over, your infection can cause chickenpox in people who have never had it or been vaccinated against it. To prevent this from happening, avoid contact with other people, especially: ? Babies. ? Pregnant women. ? Children who have eczema. ? Elderly people who have transplants. ? People who have chronic illnesses, such as leukemia or AIDS. Contact a health care provider if:  Your pain is not relieved with prescribed medicines.  Your pain does not get better after the rash heals.  Your rash looks infected. Signs of infection include redness, swelling, and pain that lasts or increases. Get help right away if:  The rash is on your face or nose.  You have facial pain, pain around your eye area, or loss of feeling on one side of your face.  You have ear pain or you have ringing in your ear.  You have loss of taste.  Your condition gets worse. This information is not intended to replace advice given to you by  your health care provider. Make sure you discuss any questions you have with your health care provider. Document Released: 09/29/2005 Document Revised: 05/25/2016 Document Reviewed: 08/10/2014 Elsevier Interactive Patient Education  2017 [employer].

## 2017-07-13 DIAGNOSIS — B029 Zoster without complications: Secondary | ICD-10-CM

## 2017-07-13 HISTORY — DX: Zoster without complications: B02.9

## 2017-07-17 ENCOUNTER — Encounter: Payer: Self-pay | Admitting: Physician Assistant

## 2017-07-24 ENCOUNTER — Ambulatory Visit: Payer: Self-pay | Admitting: Physician Assistant

## 2017-07-30 ENCOUNTER — Encounter: Payer: Self-pay | Admitting: Physician Assistant

## 2017-07-30 ENCOUNTER — Ambulatory Visit (INDEPENDENT_AMBULATORY_CARE_PROVIDER_SITE_OTHER): Payer: Medicare Other | Admitting: Physician Assistant

## 2017-07-30 VITALS — BP 126/74 | HR 86 | Temp 97.5°F | Resp 16 | Ht 68.0 in | Wt 199.0 lb

## 2017-07-30 DIAGNOSIS — R1031 Right lower quadrant pain: Secondary | ICD-10-CM

## 2017-07-30 DIAGNOSIS — R300 Dysuria: Secondary | ICD-10-CM | POA: Diagnosis not present

## 2017-07-30 DIAGNOSIS — E039 Hypothyroidism, unspecified: Secondary | ICD-10-CM

## 2017-07-30 DIAGNOSIS — L918 Other hypertrophic disorders of the skin: Secondary | ICD-10-CM

## 2017-07-30 NOTE — Patient Instructions (Signed)
Your ears and sinuses are connected by the eustachian tube. When your sinuses are inflamed, this can close off the tube and cause fluid to collect in your middle ear. This can then cause dizziness, popping, clicking, ringing, and echoing in your ears. This is often NOT an infection and does NOT require antibiotics, it is caused by inflammation so the treatments help the inflammation. This can take a long time to get better so please be patient.  Here are things you can do to help with this: - Try the Flonase or Nasonex. Remember to spray each nostril twice towards the outer part of your eye.  Do not sniff but instead pinch your nose and tilt your head back to help the medicine get into your sinuses.  The best time to do this is at bedtime.Stop if you get blurred vision or nose bleeds.  -While drinking fluids, pinch and hold nose close and swallow, to help open eustachian tubes to drain fluid behind ear drums. -Please pick one of the over the counter allergy medications below and take it once daily for allergies.  It will also help with fluid behind ear drums. Claritin or loratadine cheapest but likely the weakest  Zyrtec or certizine at night because it can make you sleepy The strongest is allegra or fexafinadine  Cheapest at [facility], [facility], [facility] -can use decongestant over the counter, please do not use if you have high blood pressure or certain heart conditions.   if worsening HA, changes vision/speech, imbalance, weakness go to the ER   

## 2017-07-30 NOTE — Progress Notes (Signed)
Subjective:    Patient ID: Susan KosKaren L Bartlett, female    DOB: 10-28-1965, 51 y.o.   MRN: [id number]  HPI 51 y.o. WF presents for skin tag removal and has been having right lower quadrant pain and urination pain x 2 days. She has been having chills but no fever. Her shingles has been better.  She has lost 6 lbs and is feeling better with dieting, waist is 40 inchs.  She has 3 skin tags that are very irritating to her, one under her left axilla, left breast and and right inguinal area she would like removed.  She has had cold intolerance, she is taking 1 thyroid at 6 AM.  Lab Results  Component Value Date   TSH 4.96 (H) 06/09/2017    Blood pressure 126/74, pulse 86, temperature (!) 97.5 F (36.4 C), resp. rate 16, height 5\' 8"  (1.727 m), weight 199 lb (90.3 kg), last menstrual period 08/04/2012, SpO2 99 %.  Medications Current Outpatient Prescriptions on File Prior to Visit  Medication Sig  . clonazePAM (KLONOPIN) 1 MG tablet Take 2 mg by mouth 3 (three) times daily. Take 1/2 by mouth every am, 1/2 at lunch, and 1 every night  . gabapentin (NEURONTIN) 100 MG capsule Take 1 capsule 3 x / day for pain  . ibuprofen (ADVIL,MOTRIN) 200 MG tablet Take 200 mg by mouth every 6 (six) hours as needed.  Marland Kitchen. levothyroxine (SYNTHROID, LEVOTHROID) 75 MCG tablet TAKE ONE TABLET BY MOUTH ONCE DAILY  . pravastatin (PRAVACHOL) 40 MG tablet Take 1 tablet (40 mg total) by mouth daily.  . sertraline (ZOLOFT) 100 MG tablet Take 200 mg by mouth 2 (two) times daily.   . Vitamin D, Ergocalciferol, (DRISDOL) 50000 units CAPS capsule Take 1 capsule (50,000 Units total) by mouth 2 (two) times a week.   No current facility-administered medications on file prior to visit.     Problem list She has Hepatomegaly; Mixed hyperlipidemia; Panic disorder; Anal fissure; Depression, major, in remission (HCC); Hypothyroidism; Obesity; Vitamin D deficiency; Tobacco use disorder; Prediabetes; and TMJ (dislocation of temporomandibular  joint) on her problem list.   Review of Systems  Constitutional: Negative for chills.  HENT: Negative.   Respiratory: Negative.   Cardiovascular: Negative.   Gastrointestinal: Negative.  Negative for nausea and vomiting.  Genitourinary: Positive for dysuria, frequency and urgency. Negative for decreased urine volume, difficulty urinating, dyspareunia, enuresis, flank pain, genital sores, hematuria, menstrual problem, pelvic pain, vaginal bleeding and vaginal discharge.       Objective:   Physical Exam  Constitutional: She is oriented to person, place, and time. She appears well-developed and well-nourished.  HENT:  Head: Normocephalic and atraumatic.  Right Ear: External ear normal.  Left Ear: External ear normal.  Mouth/Throat: Oropharynx is clear and moist.  Eyes: Pupils are equal, round, and reactive to light. Conjunctivae and EOM are normal.  Neck: Normal range of motion. Neck supple. No thyromegaly present.  Cardiovascular: Normal rate, regular rhythm and normal heart sounds.  Exam reveals no gallop and no friction rub.   No murmur heard. Pulmonary/Chest: Effort normal and breath sounds normal. No respiratory distress. She has no wheezes.  Abdominal: Soft. Bowel sounds are normal. She exhibits no distension and no mass. There is tenderness (RLQ). There is no rebound and no guarding.  Musculoskeletal: Normal range of motion.  Lymphadenopathy:    She has no cervical adenopathy.  Neurological: She is alert and oriented to person, place, and time. She displays normal reflexes. No cranial nerve  deficit. Coordination normal.  Skin: Skin is warm and dry.  3 irritated skin tags, 1 along left axilla, 1 along right inguinal, and 1 along right breast  Psychiatric: She has a normal mood and affect.       Assessment & Plan:    Dysuria -     Urinalysis, Routine w reflex microscopic -     Urine Culture  Right lower quadrant pain Check labs, urine, ? Need US/KUB -     CBC with  Differential/Platelet -     BASIC METABOLIC PANEL WITH GFR -     Hepatic function panel  Hypothyroidism, unspecified type -     TSH  Inflamed skin tag Area cleaned, lidocaine was used, 3 skin tags cut and electrocautery was used to control bleeding, patient tolerated well.

## 2017-07-31 ENCOUNTER — Encounter: Payer: Self-pay | Admitting: Physician Assistant

## 2017-07-31 LAB — URINALYSIS, ROUTINE W REFLEX MICROSCOPIC
BILIRUBIN URINE: NEGATIVE
Glucose, UA: NEGATIVE
HGB URINE DIPSTICK: NEGATIVE
KETONES UR: NEGATIVE
Leukocytes, UA: NEGATIVE
NITRITE: NEGATIVE
PH: 6 (ref 5.0–8.0)
Protein, ur: NEGATIVE
Specific Gravity, Urine: 1.003 (ref 1.001–1.03)

## 2017-07-31 LAB — CBC WITH DIFFERENTIAL/PLATELET
BASOS PCT: 0.7 %
Basophils Absolute: 66 cells/uL (ref 0–200)
EOS ABS: 611 {cells}/uL — AB (ref 15–500)
Eosinophils Relative: 6.5 %
HCT: 40.3 % (ref 35.0–45.0)
HEMOGLOBIN: 13.7 g/dL (ref 11.7–15.5)
Lymphs Abs: 2087 cells/uL (ref 850–3900)
MCH: 30.7 pg (ref 27.0–33.0)
MCHC: 34 g/dL (ref 32.0–36.0)
MCV: 90.4 fL (ref 80.0–100.0)
MONOS PCT: 5.8 %
MPV: 11.1 fL (ref 7.5–12.5)
Neutro Abs: 6091 cells/uL (ref 1500–7800)
Neutrophils Relative %: 64.8 %
Platelets: 312 10*3/uL (ref 140–400)
RBC: 4.46 10*6/uL (ref 3.80–5.10)
RDW: 13.3 % (ref 11.0–15.0)
Total Lymphocyte: 22.2 %
WBC mixed population: 545 cells/uL (ref 200–950)
WBC: 9.4 10*3/uL (ref 3.8–10.8)

## 2017-07-31 LAB — URINE CULTURE
MICRO NUMBER: 81167197
SPECIMEN QUALITY: ADEQUATE

## 2017-07-31 LAB — BASIC METABOLIC PANEL WITH GFR
BUN: 8 mg/dL (ref 7–25)
CALCIUM: 9.8 mg/dL (ref 8.6–10.4)
CHLORIDE: 98 mmol/L (ref 98–110)
CO2: 25 mmol/L (ref 20–32)
CREATININE: 0.67 mg/dL (ref 0.50–1.05)
GFR, Est African American: 117 mL/min/{1.73_m2} (ref >=60)
GFR, Est Non African American: 101 mL/min/{1.73_m2} (ref >=60)
GLUCOSE: 78 mg/dL (ref 65–99)
Potassium: 4.5 mmol/L (ref 3.5–5.3)
Sodium: 133 mmol/L — ABNORMAL LOW (ref 135–146)

## 2017-07-31 LAB — HEPATIC FUNCTION PANEL
AG RATIO: 2.2 (calc) (ref 1.0–2.5)
ALT: 10 U/L (ref 6–29)
AST: 15 U/L (ref 10–35)
Albumin: 4.9 g/dL (ref 3.6–5.1)
Alkaline phosphatase (APISO): 83 U/L (ref 33–130)
Bilirubin, Direct: 0.1 mg/dL (ref 0.0–0.2)
GLOBULIN: 2.2 g/dL (ref 1.9–3.7)
Indirect Bilirubin: 0.3 mg/dL (calc) (ref 0.2–1.2)
TOTAL PROTEIN: 7.1 g/dL (ref 6.1–8.1)
Total Bilirubin: 0.4 mg/dL (ref 0.2–1.2)

## 2017-07-31 LAB — TSH: TSH: 3.33 m[IU]/L

## 2017-08-07 ENCOUNTER — Encounter: Payer: Self-pay | Admitting: Internal Medicine

## 2017-10-12 ENCOUNTER — Other Ambulatory Visit: Payer: Self-pay | Admitting: Internal Medicine

## 2017-10-12 ENCOUNTER — Telehealth: Payer: Self-pay | Admitting: *Deleted

## 2017-10-12 MED ORDER — AMOXICILLIN 250 MG PO CAPS: ORAL_CAPSULE | ORAL | 0 refills | Status: DC

## 2017-10-12 NOTE — Telephone Encounter (Signed)
A message was left to inform patient Susan Bartlett has been sent to her pharmacy by Dr Oneta RackMckeown, for her URI symptoms.

## 2017-11-03 NOTE — Progress Notes (Signed)
MEDICARE ANNUAL WELLNESS VISIT   Assessment:    Hypothyroidism, unspecified hypothyroidism type Hypothyroidism-check TSH level, continue medications the same, reminded to take on an empty stomach 30-8160mins before food.  - TSH   Hepatomegaly Weight loss advised, no alcohol - Hepatic function panel  Mixed hyperlipidemia -continue medications, check lipids, decrease fatty foods, increase activity. 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   Prediabetes Discussed general issues about diabetes pathophysiology and management., Educational material distributed., Suggested low cholesterol diet., Encouraged aerobic exercise., Discussed foot care., Reminded to get yearly retinal exam. - Hemoglobin A1c - Insulin, fasting  TMJ (dislocation of temporomandibular joint), subsequent encounter information given to the patient, no gum/decrease hard foods, warm wet wash clothes, decrease stress, talk with dentist about possible night guard, can do massage, and exercise.   Hypokalemia -     BASIC METABOLIC PANEL WITH GFR  Benign paroxysmal positional vertigo due to bilateral vestibular disorder Normal neuro, if not better may get MRI mastoid/head, if worse go to ER- check labs   Over 40 minutes of exam, counseling, chart review and critical  decision making was performed  Future Appointments  Date Time Provider Department Center  02/02/2018 12:45 PM Jerene BearsMiller, [name], MD [facility] None  11/18/2018 10:00 AM Quentin Mullingollier, Huzaifa Viney, PA-C [facility] None     Plan:   During the course of the visit the patient was educated and counseled about appropriate screening and preventive services including:    Pneumococcal vaccine   Influenza vaccine  Td vaccine  Screening electrocardiogram  Bone densitometry screening  Colorectal cancer screening  Diabetes screening  Glaucoma screening  Nutrition counseling   Advanced directives: requested   Subjective:  Susan Bartlett is a 52 y.o. female who presents for Medicare Annual Wellness Visit.    Her blood pressure has not been controlled at home, today their BP is BP: 126/70.  She does not workout. She denies chest pain, shortness of breath, dizziness.  She is on cholesterol medication, pravastatin 40 and denies myalgias. Her cholesterol is not at goal. The cholesterol last visit was:   Lab Results  Component Value Date   CHOL 205 (H) 06/09/2017   HDL 53 06/09/2017   LDLCALC 84 11/04/2016   TRIG 124 06/09/2017   CHOLHDL 3.9 06/09/2017   Last Z6XA1C in the office was:  Lab Results  Component Value Date   HGBA1C 5.1 06/09/2017   Patient is on Vitamin D supplement.   Lab Results  Component Value Date   VD25OH 17 (L) 06/09/2017   She is on thyroid medication. Her medication was not changed last visit.   Lab Results  Component Value Date   TSH 3.33 07/30/2017  She is on gabapentin for RLS.    BMI is Body mass index is 29.62 kg/m., she is working on diet and exercise. Wt Readings from Last 3 Encounters:  11/04/17 194 lb 12.8 oz (88.4 kg)  07/30/17 199  lb (90.3 kg)  07/08/17 200 lb 3.2 oz (90.8 kg)   Follows with Dr. Jennelle Human and gets klonopin from them, it has changed manufatuer, having bilateral tinnitus, some dizziness, no decreased hearing.  Will go between  diarrhea/contstipation.   Medication Review: Current Outpatient Medications on File Prior to Visit  Medication Sig Dispense Refill  . clonazePAM (KLONOPIN) 1 MG tablet Take 2 mg by mouth 3 (three) times daily. Take 1/2 by mouth every am, 1/2 at lunch, and 1 every night    . gabapentin (NEURONTIN) 100 MG capsule Take 1 capsule 3 x / day for pain 270 capsule 1  . ibuprofen (ADVIL,MOTRIN) 200 MG tablet Take 200 mg by mouth every 6 (six) hours as needed.    Marland Kitchen levothyroxine (SYNTHROID, LEVOTHROID) 75 MCG tablet TAKE ONE TABLET BY MOUTH ONCE DAILY 30 tablet 12  . pravastatin (PRAVACHOL) 40 MG tablet Take 1 tablet (40 mg total) by mouth daily. 90 tablet 3  . sertraline (ZOLOFT) 100 MG tablet Take 200 mg by mouth 2 (two) times daily.     . Vitamin D, Ergocalciferol, (DRISDOL) 50000 units CAPS capsule Take 1 capsule (50,000 Units total) by mouth 2 (two) times a week. 12 capsule 4   No current facility-administered medications on file prior to visit.     Current Problems (verified) Patient Active Problem List   Diagnosis Date Noted  . Tobacco use disorder 01/16/2015  . Prediabetes 01/16/2015  . TMJ (dislocation of temporomandibular joint) 01/16/2015  . Vitamin D deficiency   . Hepatomegaly   . Mixed hyperlipidemia   . Panic disorder   . Anal fissure   . Depression, major, in remission (HCC)   . Hypothyroidism   . Obesity     Screening Tests Immunization History  Administered Date(s) Administered  . Influenza Split 08/01/2013  . Td 11/18/2012   Preventative care: Tetanus: 11/2012 Pneumovax: N/A Prevnar 13: N/A Flu vaccines declines Zostavax: N/A  Pap:  08/2015, Dr. Hyacinth Meeker MGM: 01/2016 DEXA: 2008, heel neg Colonoscopy: 11/2014 due 10 years EGD: N/A PFT: 01/2013 Stress test: 12/2012 [facility]) Echo: 12/2012 CT head 2013  MRI brain 2011  Names of Other Physician/Practitioners you currently use: 1. [facility] here for primary care 2.   [facility], eye doctor, last visit 1999 3. Dr. Lequita Asal , dentist, last visit 6 years Patient Care Team: Lucky Cowboy, MD as PCP - General (Internal Medicine) Wendall Stade, MD as Consulting Physician (Cardiology) Iva Boop, MD as Consulting Physician (Gastroenterology) Jerene Bears, MD as Consulting Physician (Gynecology)  Allergies No Known Allergies  SURGICAL HISTORY She  has a past surgical history that includes Colonoscopy (03/25/2007); Hemorroidectomy; Cholecystectomy; Left oophorectomy; and Tonsillectomy. FAMILY HISTORY Her family history includes Alcohol abuse in her sister; Breast cancer in her mother; Cirrhosis in her father; Colon cancer (age of onset: 11) in her maternal grandmother; Diabetes in her mother; Drug abuse in her brother; Heart disease in her father. SOCIAL HISTORY She  reports that she has been smoking cigarettes.  She has been smoking about 1.00 pack per day. she has never used smokeless tobacco. She reports that she does not drink alcohol or use drugs.  MEDICARE WELLNESS OBJECTIVES: Physical activity: Current Exercise Habits: Home exercise routine, Type of exercise: walking, Time (Minutes): 30, Frequency (Times/Week): 4, Weekly Exercise (Minutes/Week): 120, Intensity: Mild Cardiac risk factors: Cardiac Risk Factors include: advanced age (>6men, >72 women);dyslipidemia;hypertension;smoking/ tobacco exposure Depression/mood screen:   Depression screen [facility] 2/9 11/04/2017  Decreased Interest 0  Down, Depressed, Hopeless 0  PHQ - 2 Score 0  Altered sleeping -  Tired, decreased energy -  Change in appetite -  Feeling bad or failure about yourself  -  Trouble concentrating -  Moving slowly or fidgety/restless -  Suicidal thoughts -  PHQ-9 Score -  Difficult doing work/chores -    ADLs:  In your present state of health, do you have any difficulty performing the following activities: 11/04/2017  Hearing? N  Vision? N  Difficulty  concentrating or making decisions? N  Walking or climbing stairs? N  Dressing or bathing? N  Doing errands, shopping? N  Some recent data might be hidden     Cognitive Testing  Alert? Yes  Normal Appearance?Yes  Oriented to person? Yes  Place? Yes   Time? Yes  Recall of three objects?  Yes  Can perform simple calculations? Yes  Displays appropriate judgment?Yes  Can read the correct time from a watch face?Yes  EOL planning: Does Patient Have a Medical Advance Directive?: No Would patient like information on creating a medical advance directive?: No - Patient declined   Review of Systems  Constitutional: Positive for malaise/fatigue. Negative for chills and fever.  HENT: Negative for congestion, ear pain, hearing loss and sore throat.   Respiratory: Negative for cough, sputum production, shortness of breath and wheezing.   Cardiovascular: Negative for chest pain, palpitations and leg swelling.  Neurological: Positive for headaches.  Psychiatric/Behavioral: Positive for depression.    Objective:     Blood pressure 126/70, pulse 69, temperature (!) 97.4 F (36.3 C), resp. rate 18, height 5\' 8"  (1.727 m), weight 194 lb 12.8 oz (88.4 kg), last menstrual period 08/04/2012, SpO2 98 %. Body mass index is 29.62 kg/m.  General appearance: alert, no distress, WD/WN, female HEENT: normocephalic, sclerae anicteric, TMs pearly, nares patent, no discharge or erythema, pharynx normal, + TMJ tenderness bilateral.  Oral cavity: MMM, no lesions Neck: supple, no lymphadenopathy, no thyromegaly, no masses Heart: RRR, normal S1, S2, no murmurs Lungs: CTA bilaterally, no wheezes, rhonchi, or rales Abdomen: +bs, soft, non tender, non distended, no masses, no hepatomegaly, no splenomegaly Musculoskeletal: nontender, no swelling, no obvious deformity Extremities: no edema, no cyanosis, no clubbing Pulses: 2+ symmetric, upper and lower extremities, normal cap refill Neurological: alert, oriented x  3, CN2-12 intact, strength normal upper extremities and lower extremities, sensation normal throughout, DTRs 2+ throughout, no cerebellar signs, normalgait Psychiatric: normal affect, behavior normal, pleasant   Medicare Attestation I have personally reviewed: The patient's medical and social history Their use of alcohol, tobacco or illicit drugs Their current medications and supplements The patient's functional ability including ADLs,fall risks, home safety risks, cognitive, and hearing and visual impairment Diet and physical activities Evidence for depression or mood disorders  The patient's weight, height, BMI, and visual acuity have been recorded in the chart.  I have made referrals, counseling, and provided education to the patient based on review of the above and I have provided the patient with a written personalized care plan for preventive services.     Quentin Mulling, PA-C   11/04/2017

## 2017-11-04 ENCOUNTER — Encounter: Payer: Self-pay | Admitting: Physician Assistant

## 2017-11-04 ENCOUNTER — Ambulatory Visit (INDEPENDENT_AMBULATORY_CARE_PROVIDER_SITE_OTHER): Payer: Medicare Other | Admitting: Physician Assistant

## 2017-11-04 VITALS — BP 126/70 | HR 69 | Temp 97.4°F | Resp 18 | Ht 68.0 in | Wt 194.8 lb

## 2017-11-04 DIAGNOSIS — R6889 Other general symptoms and signs: Secondary | ICD-10-CM | POA: Diagnosis not present

## 2017-11-04 DIAGNOSIS — F325 Major depressive disorder, single episode, in full remission: Secondary | ICD-10-CM

## 2017-11-04 DIAGNOSIS — R160 Hepatomegaly, not elsewhere classified: Secondary | ICD-10-CM | POA: Diagnosis not present

## 2017-11-04 DIAGNOSIS — Z79899 Other long term (current) drug therapy: Secondary | ICD-10-CM | POA: Diagnosis not present

## 2017-11-04 DIAGNOSIS — F410 Panic disorder [episodic paroxysmal anxiety] without agoraphobia: Secondary | ICD-10-CM

## 2017-11-04 DIAGNOSIS — R7303 Prediabetes: Secondary | ICD-10-CM

## 2017-11-04 DIAGNOSIS — E782 Mixed hyperlipidemia: Secondary | ICD-10-CM

## 2017-11-04 DIAGNOSIS — S0300XD Dislocation of jaw, unspecified side, subsequent encounter: Secondary | ICD-10-CM

## 2017-11-04 DIAGNOSIS — Z0001 Encounter for general adult medical examination with abnormal findings: Secondary | ICD-10-CM | POA: Diagnosis not present

## 2017-11-04 DIAGNOSIS — D649 Anemia, unspecified: Secondary | ICD-10-CM

## 2017-11-04 DIAGNOSIS — E039 Hypothyroidism, unspecified: Secondary | ICD-10-CM | POA: Diagnosis not present

## 2017-11-04 DIAGNOSIS — E559 Vitamin D deficiency, unspecified: Secondary | ICD-10-CM

## 2017-11-04 DIAGNOSIS — F17200 Nicotine dependence, unspecified, uncomplicated: Secondary | ICD-10-CM

## 2017-11-04 DIAGNOSIS — Z0000 Encounter for general adult medical examination without abnormal findings: Secondary | ICD-10-CM

## 2017-11-04 NOTE — Patient Instructions (Addendum)
[facility]  7 a.m.-6:30 p.m., Monday 7 a.m.-5 p.m., Tuesday-Friday Schedule an appointment by calling [phone].   Benefiber or Citracel is good for constipation/diarrhea/irritable bowel syndrome, it helps with weight loss and can help lower your bad cholesterol. Please do 1 TBSP in the morning in water, coffee, or tea. It can take up to a month before you can see a difference with your bowel movements. It is cheapest from [employer], [employer], [employer].   Will get you to see ENT for dizziness and tinnitus and fullness in ears  Tinnitus Tinnitus refers to hearing a sound when there is no actual source for that sound. This is often described as ringing in the ears. However, people with this condition may hear a variety of noises. A person may hear the sound in one ear or in both ears. The sounds of tinnitus can be soft, loud, or somewhere in between. Tinnitus can last for a few seconds or can be constant for days. It may go away without treatment and come back at various times. When tinnitus is constant or happens often, it can lead to other problems, such as trouble sleeping and trouble concentrating. Almost everyone experiences tinnitus at some point. Tinnitus that is long-lasting (chronic) or comes back often is a problem that may require medical attention. What are the causes? The cause of tinnitus is often not known. In some cases, it can result from other problems or conditions, including:  Exposure to loud noises from machinery, music, or other sources.  Hearing loss.  Ear or sinus infections.  Earwax buildup.  A foreign object in the ear.  Use of certain medicines.  Use of alcohol and caffeine.  High blood pressure.  Heart diseases.  Anemia.  Allergies.  Meniere disease.  Thyroid problems.  Tumors.  An enlarged part of a weakened blood vessel (aneurysm).  What are the signs or symptoms? The main symptom of tinnitus is hearing a sound  when there is no source for that sound. It may sound like:  Buzzing.  Roaring.  Ringing.  Blowing air, similar to the sound heard when you listen to a seashell.  Hissing.  Whistling.  Sizzling.  Humming.  Running water.  A sustained musical note.  How is this diagnosed? Tinnitus is diagnosed based on your symptoms. Your health care provider will do a physical exam. A comprehensive hearing exam (audiologic exam) will be done if your tinnitus:  Affects only one ear (unilateral).  Causes hearing difficulties.  Lasts 6 months or longer.  You may also need to see a health care provider who specializes in hearing disorders (audiologist). You may be asked to complete a questionnaire to determine the severity of your tinnitus. Tests may be done to help determine the cause and to rule out other conditions. These can include:  Imaging studies of your head and brain, such as: ? A CT scan. ? An MRI.  An imaging study of your blood vessels (angiogram).  How is this treated? Treating an underlying medical condition can sometimes make tinnitus go away. If your tinnitus continues, other treatments may include:  Medicines, such as certain antidepressants or sleeping aids.  Sound generators to mask the tinnitus. These include: ? Tabletop sound machines that play relaxing sounds to help you fall asleep. ? Wearable devices that fit in your ear and play sounds or music. ? A small device that uses headphones to deliver a signal embedded in music (acoustic neural stimulation). In time, this may  change the pathways of your brain and make you less sensitive to tinnitus. This device is used for very severe cases when no other treatment is working.  Therapy and counseling to help you manage the stress of living with tinnitus.  Using hearing aids or cochlear implants, if your tinnitus is related to hearing loss.  Follow these instructions at home:  When possible, avoid being in loud places  and being exposed to loud sounds.  Wear hearing protection, such as earplugs, when you are exposed to loud noises.  Do not take stimulants, such as nicotine, alcohol, or caffeine.  Practice techniques for reducing stress, such as meditation, yoga, or deep breathing.  Use a white noise machine, a humidifier, or other devices to mask the sound of tinnitus.  Sleep with your head slightly raised. This may reduce the impact of tinnitus.  Try to get plenty of rest each night. Contact a health care provider if:  You have tinnitus in just one ear.  Your tinnitus continues for 3 weeks or longer without stopping.  Home care measures are not helping.  You have tinnitus after a head injury.  You have tinnitus along with any of the following: ? Dizziness. ? Loss of balance. ? Nausea and vomiting. This information is not intended to replace advice given to you by your health care provider. Make sure you discuss any questions you have with your health care provider. Document Released: 09/29/2005 Document Revised: 06/01/2016 Document Reviewed: 03/01/2014 Elsevier Interactive Patient Education  2018 [employer]   Please go to the ER if you have any severe AB pain, unable to hold down food/water, blood in stool or vomit, chest pain, shortness of breath, or any worsening symptoms.   Consider keeping a food diary- common causes of diarrhea are dairy, certain carbs...  FODMAP stands for fermentable oligo-, di-, mono-saccharides and polyols (1). These are the scientific terms used to classify groups of carbs that are notorious for triggering digestive symptoms like bloating, gas and stomach pain.   FODMAPs are found in a wide range of foods in varying amounts. Some foods contain just one type, while others contain several.  The main dietary sources of the four groups of FODMAPs include:  Oligosaccharides: Wheat, rye, legumes and various fruits and vegetables, such as garlic and  onions.  Disaccharides: Milk, yogurt and soft cheese. Lactose is the main carb.  Monosaccharides: Various fruit including figs and mangoes, and sweeteners such as honey and agave nectar. Fructose is the main carb.  Polyols: Certain fruits and vegetables including blackberries and lychee, as well as some low-calorie sweeteners like those in sugar-free gum.   Keep a food diary. This will help you identify foods that cause symptoms. Write down: ? What you eat and when. ? What symptoms you have. ? When symptoms occur in relation to your meals.  Avoid foods that cause symptoms. Talk with your dietitian about other ways to get the same nutrients that are in these foods.  Eat your meals slowly, in a relaxed setting.  Aim to eat 5-6 small meals per day. Do not skip meals.  Drink enough fluids to keep your urine clear or pale yellow.  Ask your health care provider if you should take an over-the-counter probiotic during flare-ups to help restore healthy gut bacteria.  If you have cramping or diarrhea, try making your meals low in fat and high in carbohydrates. Examples of carbohydrates are pasta, rice, whole grain breads and cereals, fruits, and vegetables.  If dairy  products cause your symptoms to flare up, try eating less of them. You might be able to handle yogurt better than other dairy products because it contains bacteria that help with digestion.

## 2017-11-05 LAB — HEPATIC FUNCTION PANEL
AG RATIO: 1.9 (calc) (ref 1.0–2.5)
ALKALINE PHOSPHATASE (APISO): 88 U/L (ref 33–130)
ALT: 9 U/L (ref 6–29)
AST: 13 U/L (ref 10–35)
Albumin: 4.6 g/dL (ref 3.6–5.1)
BILIRUBIN DIRECT: 0.1 mg/dL (ref 0.0–0.2)
BILIRUBIN TOTAL: 0.4 mg/dL (ref 0.2–1.2)
Globulin: 2.4 g/dL (calc) (ref 1.9–3.7)
Indirect Bilirubin: 0.3 mg/dL (calc) (ref 0.2–1.2)
Total Protein: 7 g/dL (ref 6.1–8.1)

## 2017-11-05 LAB — LIPID PANEL
CHOL/HDL RATIO: 4.1 (calc) (ref <5.0)
Cholesterol: 170 mg/dL (ref <200)
HDL: 41 mg/dL — ABNORMAL LOW (ref >50)
LDL Cholesterol (Calc): 97 mg/dL (calc)
NON-HDL CHOLESTEROL (CALC): 129 mg/dL (ref <130)
Triglycerides: 230 mg/dL — ABNORMAL HIGH (ref <150)

## 2017-11-05 LAB — BASIC METABOLIC PANEL WITH GFR
BUN: 7 mg/dL (ref 7–25)
CO2: 24 mmol/L (ref 20–32)
CREATININE: 0.69 mg/dL (ref 0.50–1.05)
Calcium: 9.9 mg/dL (ref 8.6–10.4)
Chloride: 101 mmol/L (ref 98–110)
GFR, EST AFRICAN AMERICAN: 116 mL/min/{1.73_m2} (ref >=60)
GFR, EST NON AFRICAN AMERICAN: 100 mL/min/{1.73_m2} (ref >=60)
Glucose, Bld: 91 mg/dL (ref 65–99)
POTASSIUM: 4.2 mmol/L (ref 3.5–5.3)
Sodium: 135 mmol/L (ref 135–146)

## 2017-11-05 LAB — CBC WITH DIFFERENTIAL/PLATELET
BASOS ABS: 59 {cells}/uL (ref 0–200)
Basophils Relative: 0.8 %
EOS PCT: 8.9 %
Eosinophils Absolute: 659 cells/uL — ABNORMAL HIGH (ref 15–500)
HCT: 40.6 % (ref 35.0–45.0)
Hemoglobin: 14.1 g/dL (ref 11.7–15.5)
Lymphs Abs: 2553 cells/uL (ref 850–3900)
MCH: 30.7 pg (ref 27.0–33.0)
MCHC: 34.7 g/dL (ref 32.0–36.0)
MCV: 88.5 fL (ref 80.0–100.0)
MONOS PCT: 6.1 %
MPV: 11.1 fL (ref 7.5–12.5)
NEUTROS ABS: 3678 {cells}/uL (ref 1500–7800)
NEUTROS PCT: 49.7 %
Platelets: 280 10*3/uL (ref 140–400)
RBC: 4.59 10*6/uL (ref 3.80–5.10)
RDW: 13 % (ref 11.0–15.0)
Total Lymphocyte: 34.5 %
WBC mixed population: 451 cells/uL (ref 200–950)
WBC: 7.4 10*3/uL (ref 3.8–10.8)

## 2017-11-05 LAB — VITAMIN D 25 HYDROXY (VIT D DEFICIENCY, FRACTURES): Vit D, 25-Hydroxy: 9 ng/mL — ABNORMAL LOW (ref 30–100)

## 2017-11-05 LAB — TSH: TSH: 4.59 mIU/L — ABNORMAL HIGH

## 2017-11-05 LAB — MAGNESIUM: MAGNESIUM: 1.9 mg/dL (ref 1.5–2.5)

## 2017-11-10 ENCOUNTER — Encounter: Payer: Self-pay | Admitting: Physician Assistant

## 2017-11-19 ENCOUNTER — Other Ambulatory Visit: Payer: Self-pay | Admitting: Obstetrics & Gynecology

## 2017-11-19 DIAGNOSIS — Z1231 Encounter for screening mammogram for malignant neoplasm of breast: Secondary | ICD-10-CM

## 2017-12-02 ENCOUNTER — Encounter: Payer: Self-pay | Admitting: Internal Medicine

## 2017-12-09 ENCOUNTER — Ambulatory Visit: Payer: Medicare Other

## 2017-12-18 ENCOUNTER — Telehealth: Payer: Self-pay | Admitting: Obstetrics & Gynecology

## 2017-12-18 NOTE — Telephone Encounter (Signed)
Left message regarding upcoming aex appointment has been canceled and needs to be rescheduled.

## 2017-12-21 ENCOUNTER — Other Ambulatory Visit: Payer: Self-pay | Admitting: Obstetrics & Gynecology

## 2017-12-21 MED ORDER — LEVOTHYROXINE SODIUM 75 MCG PO TABS: 75.0000 ug | ORAL_TABLET | Freq: Every day | ORAL | 2 refills | Status: DC

## 2017-12-21 NOTE — Telephone Encounter (Signed)
Medication refill request: levothyroxine  Last AEX:  11/04/16 SM Next AEX: 01/27/18  Last MMG (if hormonal medication request): 02/07/17 right BIRADS2:benign  Refill authorized: 11/15/16 #[id number]. Today please advise.

## 2017-12-21 NOTE — Telephone Encounter (Signed)
Patient is requesting refills of generic synthroid to be sent to the pharmacy on file.

## 2018-01-27 ENCOUNTER — Encounter: Payer: Self-pay | Admitting: Obstetrics & Gynecology

## 2018-01-27 ENCOUNTER — Other Ambulatory Visit (HOSPITAL_COMMUNITY)
Admission: RE | Admit: 2018-01-27 | Discharge: 2018-01-27 | Disposition: A | Discharge status: Home / Self Care | Payer: Medicare Other | Source: Ambulatory Visit | Attending: Obstetrics & Gynecology | Admitting: Obstetrics & Gynecology

## 2018-01-27 ENCOUNTER — Ambulatory Visit (INDEPENDENT_AMBULATORY_CARE_PROVIDER_SITE_OTHER): Payer: Medicare Other | Admitting: Obstetrics & Gynecology

## 2018-01-27 ENCOUNTER — Other Ambulatory Visit: Payer: Self-pay

## 2018-01-27 VITALS — BP 116/70 | HR 102 | Resp 18 | Ht 67.0 in | Wt 193.0 lb

## 2018-01-27 DIAGNOSIS — Z124 Encounter for screening for malignant neoplasm of cervix: Secondary | ICD-10-CM | POA: Diagnosis not present

## 2018-01-27 DIAGNOSIS — E559 Vitamin D deficiency, unspecified: Secondary | ICD-10-CM | POA: Diagnosis not present

## 2018-01-27 DIAGNOSIS — Z01419 Encounter for gynecological examination (general) (routine) without abnormal findings: Secondary | ICD-10-CM

## 2018-01-27 DIAGNOSIS — E039 Hypothyroidism, unspecified: Secondary | ICD-10-CM | POA: Diagnosis not present

## 2018-01-27 NOTE — Progress Notes (Signed)
52 y.o. G1P1 SingleCaucasianF here for annual exam.  Working on weight loss.  Eating whole grains and just plant based food.  This has been how she's been successful with weight loss.  Saw Dr. Jennelle Humanottle yesterday.  Taking 5000IU Vit D daily at this point.    Denies vaginal bleeding.  Patient's last menstrual period was 08/04/2012.          Sexually active: No.  The current method of family planning is post menopausal status.    Exercising: No.   Smoker:  yes  Health Maintenance: Pap:  11/04/16 Neg. HR HPV:neg  09/04/15 Neg History of abnormal Pap:  Yes, ASCUS MMG:  02/08/16 US right BIRADS2:Benign. F/u 1 year.  Has this scheduled for May 6th. Colonoscopy:  11/17/14 Normal. F/u 10 years  BMD:  Heel test  TDaP:  2014 Pneumonia vaccine(s):  n/a Shingrix:   No.  Declines.  Hep C testing: n/a Screening Labs: Here today - fasting   reports that she has been smoking cigarettes.  She has been smoking about 1.00 pack per day. She has never used smokeless tobacco. She reports that she does not drink alcohol or use drugs.  Past Medical History:  Diagnosis Date  . Anal fissure 1994  . Anxiety   . Arthritis    [facility]  . Depression   . Endometriosis   . Fracture 08/2011   fracture left wrist and elbow  . Hemorrhoids 2008  . Hepatomegaly   . Hypothyroidism   . Mixed hyperlipidemia   . Obesity   . OCD (obsessive compulsive disorder)   . Panic disorder   . Plantar fasciitis, right 03/2014  . RLS (restless legs syndrome) 2014   vis sleep study- no sleep apnea  . Shingles 07/2017  . Tobacco abuse    not tolerated Chantix in the past  . Vitamin D deficiency     Past Surgical History:  Procedure Laterality Date  . CHOLECYSTECTOMY    . COLONOSCOPY  03/25/2007   small external hemorroids  . HEMORROIDECTOMY    . LEFT OOPHORECTOMY    . TONSILLECTOMY      Current Outpatient Medications  Medication Sig Dispense Refill  . Cholecalciferol (VITAMIN D3) 5000  units CAPS Take by mouth daily.    . clonazePAM (KLONOPIN) 1 MG tablet Take 2 mg by mouth 3 (three) times daily. Take 1/2 by mouth every am, 1/2 at lunch, and 1 every night    . ibuprofen (ADVIL,MOTRIN) 200 MG tablet Take 200 mg by mouth every 6 (six) hours as needed.    Marland Kitchen. levothyroxine (SYNTHROID, LEVOTHROID) 75 MCG tablet Take 1 tablet (75 mcg total) by mouth daily. 30 tablet 2  . pravastatin (PRAVACHOL) 40 MG tablet Take 1 tablet (40 mg total) by mouth daily. 90 tablet 3  . sertraline (ZOLOFT) 100 MG tablet Take 200 mg by mouth 2 (two) times daily.      No current facility-administered medications for this visit.     Family History  Problem Relation Age of Onset  . Breast cancer Mother   . Diabetes Mother   . Drug abuse Brother        and alcohol  . Cirrhosis Father        alcohol  . Heart disease Father        MI  . Colon cancer Maternal Grandmother 50  . Alcohol abuse Sister   . Thyroid disease Neg Hx     Review of Systems  All other  systems reviewed and are negative.   Exam:   BP 116/70 (BP Location: Right Arm, Patient Position: Sitting, Cuff Size: Normal)   Pulse (!) 102   Resp 18   Ht 5\' 7"  (1.702 m)   Wt 193 lb (87.5 kg)   LMP 08/04/2012   BMI 30.23 kg/m    Height: 5\' 7"  (170.2 cm)  Ht Readings from Last 3 Encounters:  01/27/18 5\' 7"  (1.702 m)  11/04/17 5\' 8"  (1.727 m)  07/30/17 5\' 8"  (1.727 m)    General appearance: alert, cooperative and appears stated age Head: Normocephalic, without obvious abnormality, atraumatic Neck: no adenopathy, supple, symmetrical, trachea midline and thyroid normal to inspection and palpation Lungs: clear to auscultation bilaterally Breasts: normal appearance, no masses or tenderness Heart: regular rate and rhythm Abdomen: soft, non-tender; bowel sounds normal; no masses,  no organomegaly Extremities: extremities normal, atraumatic, no cyanosis or edema Skin: Skin color, texture, turgor normal. No rashes or lesions Lymph  nodes: Cervical, supraclavicular, and axillary nodes normal. No abnormal inguinal nodes palpated Neurologic: Grossly normal   Pelvic: External genitalia:  no lesions              Urethra:  normal appearing urethra with no masses, tenderness or lesions              Bartholins and Skenes: normal                 Vagina: normal appearing vagina with normal color and discharge, no lesions              Cervix: absent              Pap taken: No. Bimanual Exam:  Uterus:  uterus absent              Adnexa: no mass, fullness, tenderness               Rectovaginal: Confirms               Anus:  normal sphincter tone, no lesions  Chaperone was present for exam.  A:  Well Woman with normal exam PMP, no HRT Smoker Anxiety and h/o panic disorder Hypothyroidism  P:   Mammogram guidelines reviewed.  Has MMG scheduled 5/19 pap smear obtained TSH, free T4 Vit D obtained today Return annually or prn

## 2018-01-28 LAB — TSH: TSH: 2.74 u[IU]/mL (ref 0.450–4.500)

## 2018-01-28 LAB — VITAMIN D 25 HYDROXY (VIT D DEFICIENCY, FRACTURES): Vit D, 25-Hydroxy: 20.8 ng/mL — ABNORMAL LOW (ref 30.0–100.0)

## 2018-01-28 LAB — T4, FREE: Free T4: 1.14 ng/dL (ref 0.82–1.77)

## 2018-01-28 IMAGING — MG MM DIAG BREAST TOMO BILATERAL
8 of 12 series · 8 of 28 positions shown · non-contrast
Comparison: Previous exam(s).

CLINICAL DATA: Delayed follow-up of a probably benign right breast
mass.

EXAM:
2D DIGITAL DIAGNOSTIC BILATERAL MAMMOGRAM WITH CAD AND ADJUNCT TOMO
RIGHT BREAST ULTRASOUND

[L MLO]
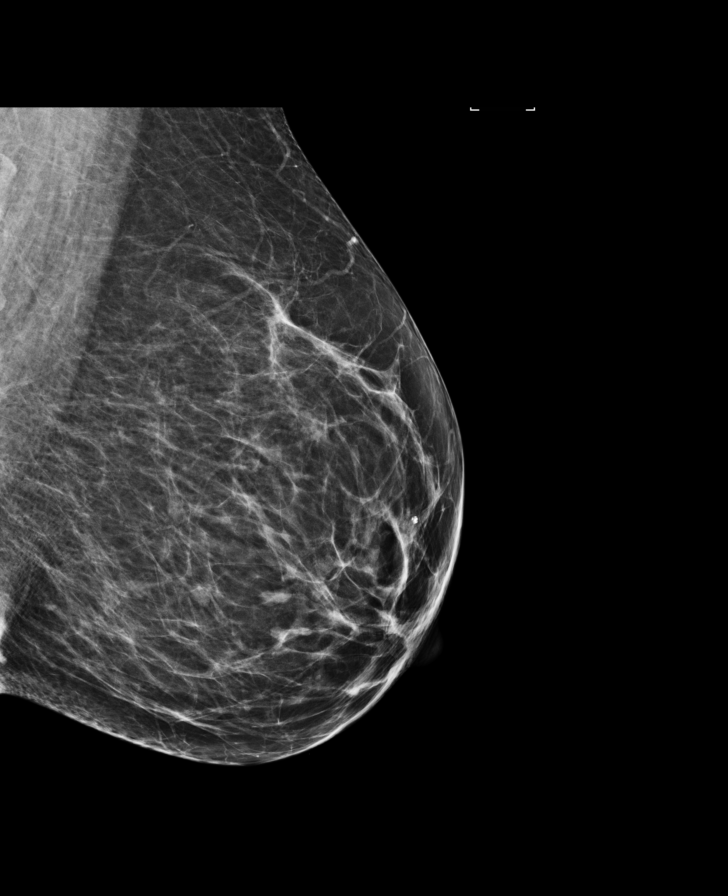

[R MLO]
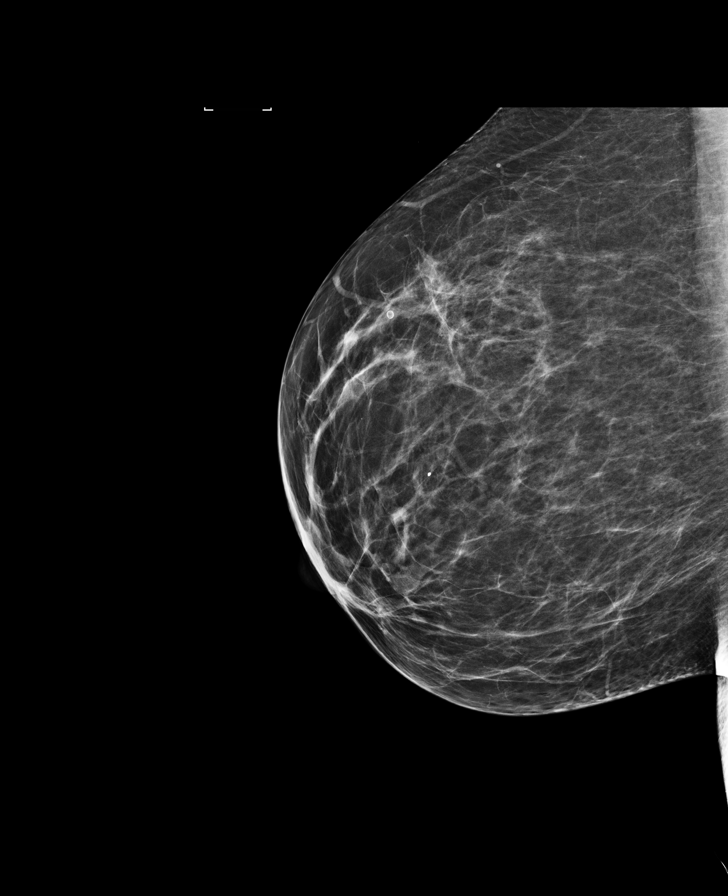

[R MLO synth-2D]
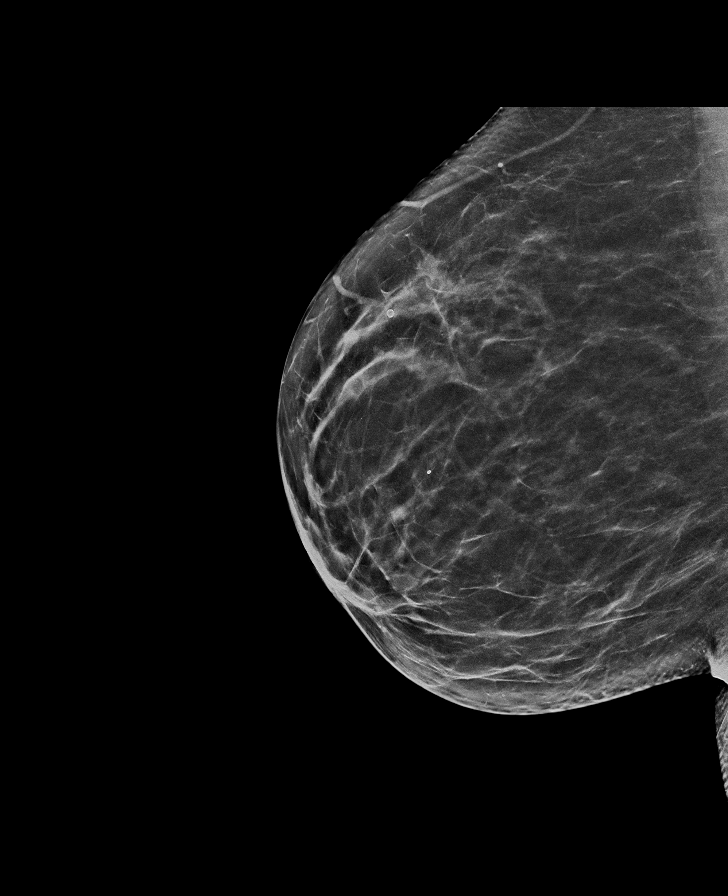

[L CC]
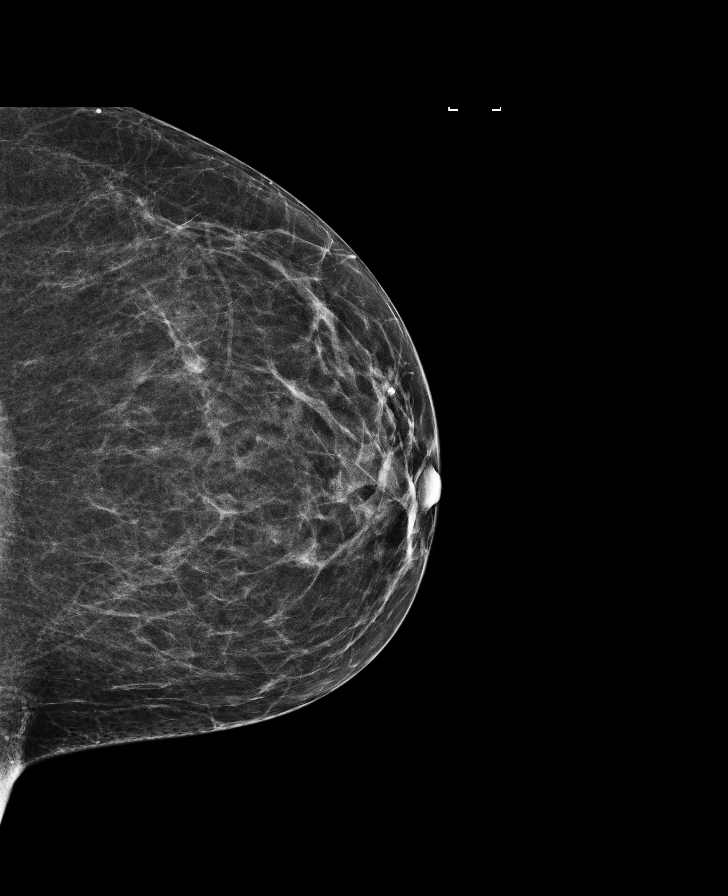

[R CC]
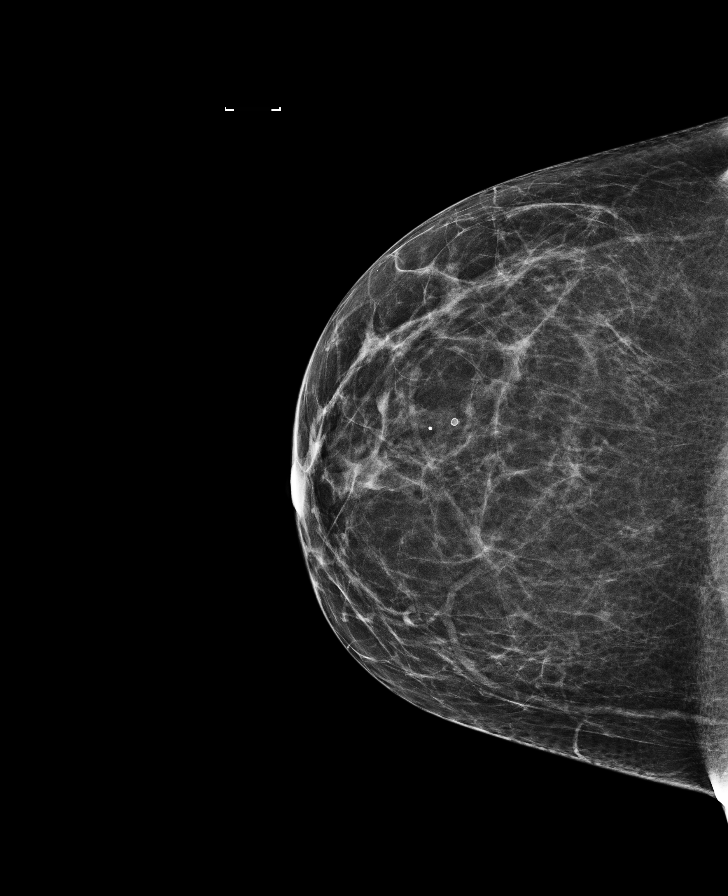

[L CC synth-2D]
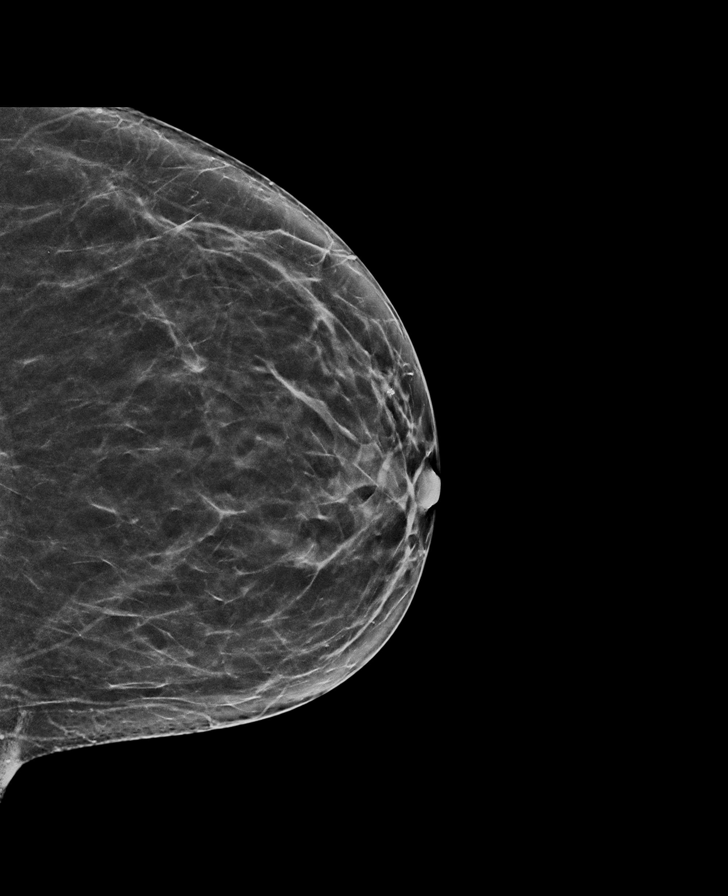

[R CC synth-2D]
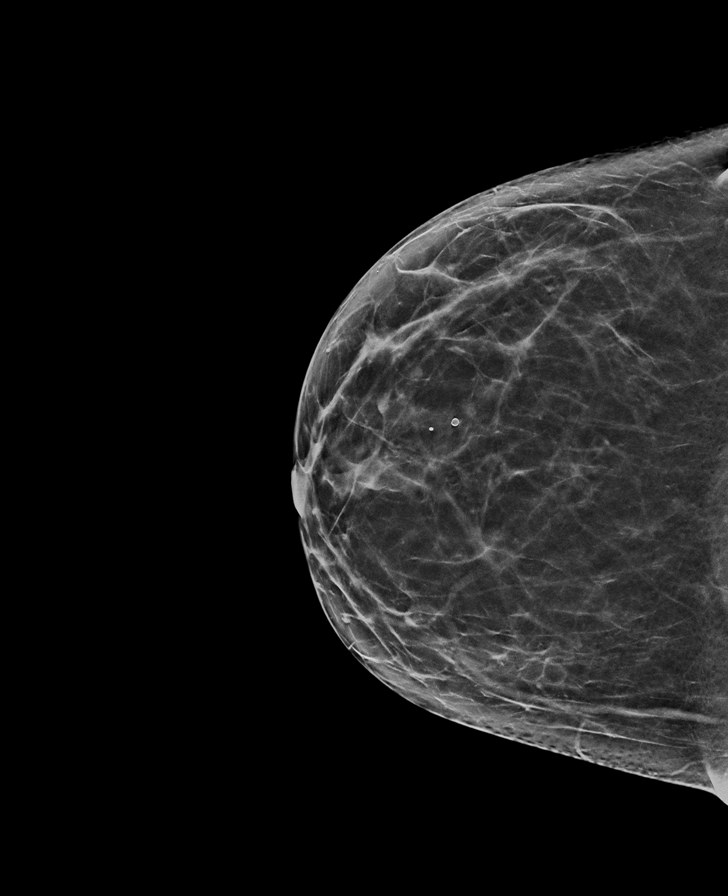

[L MLO synth-2D]
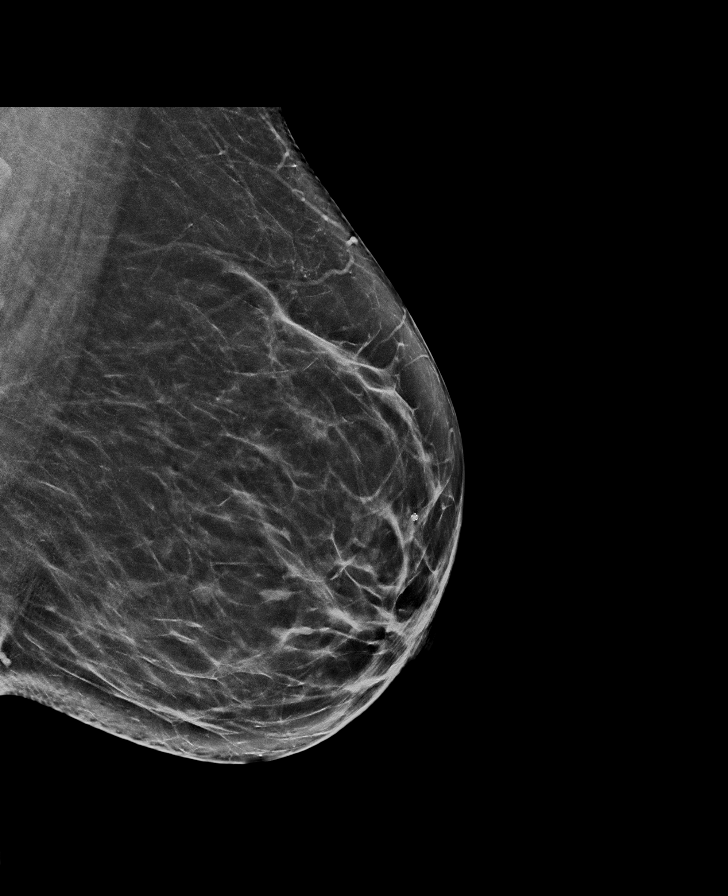

[8 of 28 positions shown; findings below may reference images not displayed]

ACR Breast Density Category b: There are scattered areas of
fibroglandular density.
FINDINGS: The small mass in the slightly lateral right breast, anterior depth
is mammographically stable. No suspicious calcifications, masses or
areas of distortion are seen in the bilateral breasts.

Mammographic images were processed with CAD.

Ultrasound targeted to the right breast at 9 o'clock 1 cm from the
nipple demonstrates a hypoechoic circumscribed oval mass measuring 6
mm, previously 5 mm. This appears more cystic than noted previously
with a thin internal septation.
IMPRESSION: 1. The right breast mass at 9 o'clock is stable in size and appears
more cystic than previously seen consistent with fibrocystic change.

2.  No mammographic evidence of malignancy in the bilateral breasts.

RECOMMENDATION:
Screening mammogram in one year.(Code:[id number])

I have discussed the findings and recommendations with the patient.
Results were also provided in writing at the conclusion of the
visit. If applicable, a reminder letter will be sent to the patient
regarding the next appointment.

BI-RADS CATEGORY  2: Benign.

## 2018-02-01 ENCOUNTER — Other Ambulatory Visit: Payer: Self-pay | Admitting: *Deleted

## 2018-02-01 DIAGNOSIS — E559 Vitamin D deficiency, unspecified: Secondary | ICD-10-CM

## 2018-02-01 LAB — CYTOLOGY - PAP: DIAGNOSIS: NEGATIVE

## 2018-02-02 ENCOUNTER — Ambulatory Visit: Payer: Medicare Other | Admitting: Obstetrics & Gynecology

## 2018-02-03 ENCOUNTER — Ambulatory Visit: Payer: Medicare Other

## 2018-02-15 ENCOUNTER — Ambulatory Visit
Admission: RE | Admit: 2018-02-15 | Discharge: 2018-02-15 | Disposition: A | Discharge status: Home / Self Care | Payer: Medicare Other | Source: Ambulatory Visit | Attending: Obstetrics & Gynecology | Admitting: Obstetrics & Gynecology

## 2018-02-15 DIAGNOSIS — Z1231 Encounter for screening mammogram for malignant neoplasm of breast: Secondary | ICD-10-CM

## 2018-02-19 DIAGNOSIS — S39012A Strain of muscle, fascia and tendon of lower back, initial encounter: Secondary | ICD-10-CM | POA: Diagnosis not present

## 2018-02-19 DIAGNOSIS — M545 Low back pain: Secondary | ICD-10-CM | POA: Diagnosis not present

## 2018-03-05 ENCOUNTER — Ambulatory Visit (INDEPENDENT_AMBULATORY_CARE_PROVIDER_SITE_OTHER): Payer: Medicare Other | Admitting: Internal Medicine

## 2018-03-05 VITALS — BP 100/82 | HR 68 | Temp 97.8°F | Resp 16 | Ht 67.0 in | Wt 192.2 lb

## 2018-03-05 DIAGNOSIS — E782 Mixed hyperlipidemia: Secondary | ICD-10-CM | POA: Diagnosis not present

## 2018-03-05 DIAGNOSIS — Z79899 Other long term (current) drug therapy: Secondary | ICD-10-CM

## 2018-03-05 DIAGNOSIS — R0989 Other specified symptoms and signs involving the circulatory and respiratory systems: Secondary | ICD-10-CM | POA: Diagnosis not present

## 2018-03-05 DIAGNOSIS — E039 Hypothyroidism, unspecified: Secondary | ICD-10-CM

## 2018-03-05 DIAGNOSIS — R7309 Other abnormal glucose: Secondary | ICD-10-CM | POA: Diagnosis not present

## 2018-03-05 DIAGNOSIS — E559 Vitamin D deficiency, unspecified: Secondary | ICD-10-CM | POA: Diagnosis not present

## 2018-03-05 DIAGNOSIS — R7303 Prediabetes: Secondary | ICD-10-CM

## 2018-03-05 NOTE — Progress Notes (Signed)
This very nice 52 y.o. single WF presents for 4 month follow up with HTN, HLD, Pre-Diabetes, Hypothyroidism,  Bipolar Disorder, Panic Disorder  and Vitamin D Deficiency. Patient had recent Thyroid screen which was Normal. Patient is on SS Disability for Panic Disorder and Depression and she reports severe Anxiety at the thought of having to work and interact with people.      Patient has hx/o labile  HTN followed expectantly since 2008  & BP has been controlled at home. Today's BP is at goal - 100/82. Patient has had no complaints of any cardiac type chest pain, palpitations, dyspnea / orthopnea / PND, dizziness, claudication, or dependent edema.     Hyperlipidemia is controlled with diet & meds. Patient denies myalgias or other med SE's. Last Lipids were at goal albeit elevated Trig's: Lab Results  Component Value Date   CHOL 170 11/04/2017   HDL 41 (L) 11/04/2017   LDLCALC 97 11/04/2017   TRIG 230 (H) 11/04/2017   CHOLHDL 4.1 11/04/2017      Also, the patient has history of Morbid Obesity (BMI 30+) and is followed expectantly for  PreDiabetes and has had no symptoms of reactive hypoglycemia, diabetic polys, paresthesias or visual blurring.  Last A1c was Normal & at goal: Lab Results  Component Value Date   HGBA1C 5.1 06/09/2017      Patient has been on Thyroid replacement since 2012. She just had TSH done one month ago at Dr Jerene Bears office (GYN).        Further, the patient also has history of Vitamin D Deficiency ("9"/ Jan.2019)  and does not supplement as recommended. Recent Vitamin D was still very low ()goal  70-100): Lab Results  Component Value Date   VD25OH 20.8 (L) 01/27/2018   Current Outpatient Medications on File Prior to Visit  Medication Sig  . Cholecalciferol (VITAMIN D3) 5000 units CAPS Take by mouth daily.  . clonazePAM (KLONOPIN) 1 MG tablet Take 2 mg by mouth 3 (three) times daily. Take 1/2 by mouth every am, 1/2 at lunch, and 1 every night  . ibuprofen  (ADVIL,MOTRIN) 200 MG tablet Take 200 mg by mouth every 6 (six) hours as needed.  Marland Kitchen levothyroxine (SYNTHROID, LEVOTHROID) 75 MCG tablet Take 1 tablet (75 mcg total) by mouth daily.  . pravastatin (PRAVACHOL) 40 MG tablet Take 1 tablet (40 mg total) by mouth daily.  . sertraline (ZOLOFT) 100 MG tablet Take 200 mg by mouth 2 (two) times daily.    No current facility-administered medications on file prior to visit.    No Known Allergies PMHx:   Past Medical History:  Diagnosis Date  . Anal fissure 1994  . Anxiety   . Arthritis    Dr. Penni Bombard at [facility]  . Depression   . Endometriosis   . Fracture 08/2011   fracture left wrist and elbow  . Hemorrhoids 2008  . Hepatomegaly   . Hypothyroidism   . Mixed hyperlipidemia   . Obesity   . OCD (obsessive compulsive disorder)   . Panic disorder   . Plantar fasciitis, right 03/2014  . RLS (restless legs syndrome) 2014   vis sleep study- no sleep apnea  . Shingles 07/2017  . Tobacco abuse    not tolerated Chantix in the past  . Vitamin D deficiency    Immunization History  Administered Date(s) Administered  . Influenza Split 08/01/2013  . Td 11/18/2012   Past Surgical History:  Procedure Laterality Date  .  CHOLECYSTECTOMY    . COLONOSCOPY  03/25/2007   small external hemorroids  . HEMORROIDECTOMY    . LEFT OOPHORECTOMY    . TONSILLECTOMY     FHx:    Reviewed / unchanged  SHx:    Reviewed / unchanged  Systems Review:  Constitutional: Denies fever, chills, wt changes, headaches, insomnia, fatigue, night sweats, change in appetite. Eyes: Denies redness, blurred vision, diplopia, discharge, itchy, watery eyes.  ENT: Denies discharge, congestion, post nasal drip, epistaxis, sore throat, earache, hearing loss, dental pain, tinnitus, vertigo, sinus pain, snoring.  CV: Denies chest pain, palpitations, irregular heartbeat, syncope, dyspnea, diaphoresis, orthopnea, PND, claudication or edema. Respiratory: denies cough,  dyspnea, DOE, pleurisy, hoarseness, laryngitis, wheezing.  Gastrointestinal: Denies dysphagia, odynophagia, heartburn, reflux, water brash, abdominal pain or cramps, nausea, vomiting, bloating, diarrhea, constipation, hematemesis, melena, hematochezia  or hemorrhoids. Genitourinary: Denies dysuria, frequency, urgency, nocturia, hesitancy, discharge, hematuria or flank pain. Musculoskeletal: Denies arthralgias, myalgias, stiffness, jt. swelling, pain, limping or strain/sprain.  Skin: Denies pruritus, rash, hives, warts, acne, eczema or change in skin lesion(s). Neuro: No weakness, tremor, incoordination, spasms, paresthesia or pain. Psychiatric: Denies confusion, memory loss or sensory loss. Endo: Denies change in weight, skin or hair change.  Heme/Lymph: No excessive bleeding, bruising or enlarged lymph nodes.  Physical Exam  BP 100/82   Pulse 68   Temp 97.8 F (36.6 C)   Resp 16   Ht  (1.702 m)   Wt 192 lb 3.2 oz (87.2 kg)   LMP 08/04/2012   BMI 30.10 kg/m   Appears  well nourished, well groomed  and in no distress.  Eyes: PERRLA, EOMs, conjunctiva no swelling or erythema. Sinuses: No frontal/maxillary tenderness ENT/Mouth: EAC's clear, TM's nl w/o erythema, bulging. Nares clear w/o erythema, swelling, exudates. Oropharynx clear without erythema or exudates. Oral hygiene is good. Tongue normal, non obstructing. Hearing intact.  Neck: Supple. Thyroid not palpable. Car 2+/2+ without bruits, nodes or JVD. Chest: Respirations nl with BS clear & equal w/o rales, rhonchi, wheezing or stridor.  Cor: Heart sounds normal w/ regular rate and rhythm without sig. murmurs, gallops, clicks or rubs. Peripheral pulses normal and equal  without edema.  Abdomen: Soft & bowel sounds normal. Non-tender w/o guarding, rebound, hernias, masses or organomegaly.  Lymphatics: Unremarkable.  Musculoskeletal: Full ROM all peripheral extremities, joint stability, 5/5 strength and normal gait.  Skin: Warm,  dry without exposed rashes, lesions or ecchymosis apparent.  Neuro: Cranial nerves intact, reflexes equal bilaterally. Sensory-motor testing grossly intact. Tendon reflexes grossly intact.  Pysch: Alert & oriented x 3.  Insight and judgement nl & appropriate. No ideations.  Assessment and Plan:  1. Labile hypertension  - Continue medication, monitor blood pressure at home.  - Continue DASH diet.  Reminder to go to the ER if any CP,  SOB, nausea, dizziness, severe HA, changes vision/speech.  - CBC with Differential/Platelet - COMPLETE METABOLIC PANEL WITH GFR - Magnesium  2. Hyperlipidemia, mixed  - Continue diet/meds, exercise,& lifestyle modifications.  - Continue monitor periodic cholesterol/liver & renal functions   - Lipid panel  3. Abnormal glucose  - Continue diet, exercise, lifestyle modifications.  - Monitor appropriate labs.  - Insulin, random - Hemoglobin A1c  4. Vitamin D deficiency  - advised to resume supplements of  5,000 units/daily and recommened continue quarterly monitoring.  Patient was reminded target goal is between 70-100 for greatest protection against malignancy, CVD and Autoimmune Dz.  5. Prediabetes  - Insulin, random  6. Hypothyroidism  7. Medication  management  - CBC with Differential/Platelet - COMPLETE METABOLIC PANEL WITH GFR - Magnesium - Lipid panel - Insulin, random           Recommended take LD bASA 81 mg for Malignancy prophylaxis. Discussed  regular exercise, BP monitoring, weight control to achieve/maintain BMI less than 25 and discussed med and SE's. Recommended labs to assess and monitor clinical status with further disposition pending results of labs. Over 30 minutes of exam, counseling, chart review was performed.

## 2018-03-05 NOTE — Patient Instructions (Addendum)
Recommend Adult Low Dose Aspirin or  coated  Aspirin 81 mg daily   To reduce risk of Colon Cancer 20 %,  Skin Cancer 26 % ,  Melanoma 46%  and  Pancreatic cancer 60% & Prevent breast cancer recurrences by 50%  ++++++++++++++++++++++++++++++++++++++++++++++++++++  Vitamin D goal  is between 70-100.   Please make sure that you are taking your Vitamin D as directed.  It is very important as a natural anti-inflammatory  helping hair, skin, and nails, as well as reducing stroke and heart attack risk.  It helps your bones and helps with mood.  It also decreases numerous cancer risks so please take it as directed.  Low Vit D is associated with a 200-300% higher risk for CANCER  and 200-300% higher risk for HEART   ATTACK  &  STROKE.   ........................................................................................................................................Marland Kitchen It is also associated with higher death rate at younger ages,   higher rates of Birth defects and miscarriages,  autoimmune diseases like Rheumatoid arthritis, Lupus, Multiple Sclerosis.    Also many other serious conditions, like depression, Alzheimer's Dementia, infertility, muscle aches, fatigue, fibromyalgia - just to name a few.  +++++++++++++++++++++++++++++++++++++++++++++++++++++++++++++  Recommend the book "The END of DIETING" by Dr Monico Hoar  & the book "The END of DIABETES " by Dr Monico Hoar At [email or web address] - get book & Audio CD's    Being diabetic has a  300% increased risk for heart attack, stroke, cancer, and alzheimer- type vascular dementia. It is very important that you work harder with diet by avoiding all foods that are white. Avoid white rice (brown & wild rice is OK), white potatoes (sweetpotatoes in moderation is OK), White bread or wheat bread or anything made out of white flour like bagels, donuts, rolls, buns, biscuits, cakes, pastries, cookies, pizza crust, and pasta (made from white flour &  egg whites) - vegetarian pasta or spinach or wheat pasta is OK. Multigrain breads like Arnold's or Pepperidge [employer], or multigrain sandwich thins or flatbreads.  Diet, exercise and weight loss can reverse and cure diabetes in the early stages.  Diet, exercise and weight loss is very important in the control and prevention of complications of diabetes which affects every system in your body, ie. Brain - dementia/stroke, eyes - glaucoma/blindness, heart - heart attack/heart failure, kidneys - dialysis, stomach - gastric paralysis, intestines - malabsorption, nerves - severe painful neuritis, circulation - gangrene & loss of a leg(s), and finally cancer and Alzheimers.    I recommend avoid fried & greasy foods,  sweets/candy, white rice (brown or wild rice or Quinoa is OK), white potatoes (sweet potatoes are OK) - anything made from white flour - bagels, doughnuts, rolls, buns, biscuits,white and wheat breads, pizza crust and traditional pasta made of white flour & egg white(vegetarian pasta or spinach or wheat pasta is OK).  Multi-grain bread is OK - like multi-grain flat bread or sandwich thins. Avoid alcohol in excess. Exercise is also important.    Eat all the vegetables you want - avoid meat, especially red meat and dairy - especially cheese.  Cheese is the most concentrated form of trans-fats which is the worst thing to clog up our arteries. Veggie cheese is OK which can be found in the fresh produce section at [employer] or [employer] or [employer]  +++++++++++++++++++++ DASH Eating Plan  DASH stands for "Dietary Approaches to Stop Hypertension."   The DASH eating plan is a healthy eating plan that has been shown to reduce high blood pressure (hypertension).  Additional health benefits may include reducing the risk of type 2 diabetes mellitus, heart disease, and stroke. The DASH eating plan may also help with weight loss. WHAT DO I NEED TO KNOW ABOUT THE DASH EATING PLAN? For the DASH eating plan,  you will follow these general guidelines:  Choose foods with a percent daily value for sodium of less than 5% (as listed on the food label).  Use salt-free seasonings or herbs instead of table salt or sea salt.  Check with your health care provider or pharmacist before using salt substitutes.  Eat lower-sodium products, often labeled as "lower sodium" or "no salt added."  Eat fresh foods.  Eat more vegetables, fruits, and low-fat dairy products.  Choose whole grains. Look for the word "whole" as the first word in the ingredient list.  Choose fish   Limit sweets, desserts, sugars, and sugary drinks.  Choose heart-healthy fats.  Eat veggie cheese   Eat more home-cooked food and less restaurant, buffet, and fast food.  Limit fried foods.  Cook foods using methods other than frying.  Limit canned vegetables. If you do use them, rinse them well to decrease the sodium.  When eating at a restaurant, ask that your food be prepared with less salt, or no salt if possible.                      WHAT FOODS CAN I EAT? Read Dr Francis Dowse Fuhrman's books on The End of Dieting & The End of Diabetes  Grains Whole grain or whole wheat bread. Brown rice. Whole grain or whole wheat pasta. Quinoa, bulgur, and whole grain cereals. Low-sodium cereals. Corn or whole wheat flour tortillas. Whole grain cornbread. Whole grain crackers. Low-sodium crackers.  Vegetables Fresh or frozen vegetables (raw, steamed, roasted, or grilled). Low-sodium or reduced-sodium tomato and vegetable juices. Low-sodium or reduced-sodium tomato sauce and paste. Low-sodium or reduced-sodium canned vegetables.   Fruits All fresh, canned (in natural juice), or frozen fruits.  Protein Products  All fish and seafood.  Dried beans, peas, or lentils. Unsalted nuts and seeds. Unsalted canned beans.  Dairy Low-fat dairy products, such as skim or 1% milk, 2% or reduced-fat cheeses, low-fat ricotta or cottage cheese, or plain  low-fat yogurt. Low-sodium or reduced-sodium cheeses.  Fats and Oils Tub margarines without trans fats. Light or reduced-fat mayonnaise and salad dressings (reduced sodium). Avocado. Safflower, olive, or canola oils. Natural peanut or almond butter.  Other Unsalted popcorn and pretzels. The items listed above may not be a complete list of recommended foods or beverages. Contact your dietitian for more options.  +++++++++++++++  WHAT FOODS ARE NOT RECOMMENDED? Grains/ White flour or wheat flour White bread. White pasta. White rice. Refined cornbread. Bagels and croissants. Crackers that contain trans fat.  Vegetables  Creamed or fried vegetables. Vegetables in a . Regular canned vegetables. Regular canned tomato sauce and paste. Regular tomato and vegetable juices.  Fruits Dried fruits. Canned fruit in light or heavy syrup. Fruit juice.  Meat and Other Protein Products Meat in general - RED meat & White meat.  Fatty cuts of meat. Ribs, chicken wings, all processed meats as bacon, sausage, bologna, salami, fatback, hot dogs, bratwurst and packaged luncheon meats.  Dairy Whole or 2% milk, cream, half-and-half, and cream cheese. Whole-fat or sweetened yogurt. Full-fat cheeses or blue cheese. Non-dairy creamers and whipped toppings. Processed cheese, cheese spreads, or cheese curds.  Condiments Onion and garlic salt, seasoned salt, table salt, and sea salt. Canned  and packaged gravies. Worcestershire sauce. Tartar sauce. Barbecue sauce. Teriyaki sauce. Soy sauce, including reduced sodium. Steak sauce. Fish sauce. Oyster sauce. Cocktail sauce. Horseradish. Ketchup and mustard. Meat flavorings and tenderizers. Bouillon cubes. Hot sauce. Tabasco sauce. Marinades. Taco seasonings. Relishes.  Fats and Oils Butter, stick margarine, lard, shortening and bacon fat. Coconut, palm kernel, or palm oils. Regular salad dressings.  Pickles and olives. Salted popcorn and pretzels.  The items listed  above may not be a complete list of foods and beverages to avoid.

## 2018-03-06 ENCOUNTER — Other Ambulatory Visit: Payer: Self-pay | Admitting: Internal Medicine

## 2018-03-06 DIAGNOSIS — E782 Mixed hyperlipidemia: Secondary | ICD-10-CM

## 2018-03-06 MED ORDER — ROSUVASTATIN CALCIUM 40 MG PO TABS: ORAL_TABLET | ORAL | 1 refills | Status: DC

## 2018-03-07 ENCOUNTER — Encounter: Payer: Self-pay | Admitting: Internal Medicine

## 2018-03-11 LAB — CBC WITH DIFFERENTIAL/PLATELET
BASOS PCT: 1.2 %
Basophils Absolute: 70 cells/uL (ref 0–200)
EOS ABS: 539 {cells}/uL — AB (ref 15–500)
Eosinophils Relative: 9.3 %
HCT: 43.5 % (ref 35.0–45.0)
HEMOGLOBIN: 14.8 g/dL (ref 11.7–15.5)
LYMPHS ABS: 1775 {cells}/uL (ref 850–3900)
MCH: 30.6 pg (ref 27.0–33.0)
MCHC: 34 g/dL (ref 32.0–36.0)
MCV: 90.1 fL (ref 80.0–100.0)
MPV: 10.8 fL (ref 7.5–12.5)
Monocytes Relative: 5.5 %
NEUTROS ABS: 3097 {cells}/uL (ref 1500–7800)
Neutrophils Relative %: 53.4 %
Platelets: 324 10*3/uL (ref 140–400)
RBC: 4.83 10*6/uL (ref 3.80–5.10)
RDW: 12.9 % (ref 11.0–15.0)
Total Lymphocyte: 30.6 %
WBC: 5.8 10*3/uL (ref 3.8–10.8)
WBCMIX: 319 {cells}/uL (ref 200–950)

## 2018-03-11 LAB — COMPLETE METABOLIC PANEL WITH GFR
AG RATIO: 2 (calc) (ref 1.0–2.5)
ALBUMIN MSPROF: 4.9 g/dL (ref 3.6–5.1)
ALT: 10 U/L (ref 6–29)
AST: 14 U/L (ref 10–35)
Alkaline phosphatase (APISO): 89 U/L (ref 33–130)
BILIRUBIN TOTAL: 0.5 mg/dL (ref 0.2–1.2)
BUN: 9 mg/dL (ref 7–25)
CALCIUM: 10.1 mg/dL (ref 8.6–10.4)
CHLORIDE: 103 mmol/L (ref 98–110)
CO2: 27 mmol/L (ref 20–32)
Creat: 0.81 mg/dL (ref 0.50–1.05)
GFR, EST AFRICAN AMERICAN: 96 mL/min/{1.73_m2} (ref >=60)
GFR, EST NON AFRICAN AMERICAN: 83 mL/min/{1.73_m2} (ref >=60)
Globulin: 2.5 g/dL (calc) (ref 1.9–3.7)
Glucose, Bld: 94 mg/dL (ref 65–99)
POTASSIUM: 4.4 mmol/L (ref 3.5–5.3)
Sodium: 138 mmol/L (ref 135–146)
TOTAL PROTEIN: 7.4 g/dL (ref 6.1–8.1)

## 2018-03-11 LAB — LIPID PANEL
CHOL/HDL RATIO: 4.1 (calc) (ref <5.0)
CHOLESTEROL: 195 mg/dL (ref <200)
HDL: 48 mg/dL — ABNORMAL LOW (ref >50)
LDL CHOLESTEROL (CALC): 122 mg/dL — AB
Non-HDL Cholesterol (Calc): 147 mg/dL (calc) — ABNORMAL HIGH (ref <130)
TRIGLYCERIDES: 137 mg/dL (ref <150)

## 2018-03-11 LAB — HEMOGLOBIN A1C
Hgb A1c MFr Bld: 5.3 % of total Hgb (ref <5.7)
MEAN PLASMA GLUCOSE: 105 (calc)
eAG (mmol/L): 5.8 (calc)

## 2018-03-11 LAB — MAGNESIUM: Magnesium: 2.1 mg/dL (ref 1.5–2.5)

## 2018-03-11 LAB — INSULIN, RANDOM: INSULIN: 7.2 u[IU]/mL (ref 2.0–19.6)

## 2018-03-14 ENCOUNTER — Encounter: Payer: Self-pay | Admitting: Internal Medicine

## 2018-03-15 ENCOUNTER — Other Ambulatory Visit: Payer: Self-pay | Admitting: Internal Medicine

## 2018-03-15 DIAGNOSIS — R300 Dysuria: Secondary | ICD-10-CM

## 2018-03-17 ENCOUNTER — Other Ambulatory Visit: Payer: Self-pay | Admitting: Obstetrics & Gynecology

## 2018-03-17 NOTE — Telephone Encounter (Signed)
Medication refill request: synthroid 75mcg Last AEX:  01-27-18 Next AEX: 04-22-19 Last MMG (if hormonal medication request): 2019 neg Refill authorized: rx last given 3/19 #30 with 2 refills. Please approve more refills if appropriate

## 2018-04-06 ENCOUNTER — Telehealth: Payer: Self-pay | Admitting: Obstetrics & Gynecology

## 2018-04-06 ENCOUNTER — Other Ambulatory Visit: Payer: Self-pay | Admitting: Obstetrics & Gynecology

## 2018-04-06 DIAGNOSIS — R8290 Unspecified abnormal findings in urine: Secondary | ICD-10-CM

## 2018-04-06 NOTE — Progress Notes (Signed)
Orders for urine testing placed.

## 2018-04-06 NOTE — Telephone Encounter (Signed)
Patient would like to have urine culture done when she comes in for her lab appointment tomorrow 04/07/18. She has been experiencing urine frequency, odor, and burning.

## 2018-04-06 NOTE — Telephone Encounter (Signed)
Orders placed for both urine micro and culture.  Thanks.

## 2018-04-06 NOTE — Telephone Encounter (Signed)
Spoke with patient. Patient states that when she was seen for her aex 01/2018 Dr.Miller mentioned checking her urine dur to urinary frequency. States this was not performed at her visit and was notified when she got her results. Was unable to return to the office for another visit at that time. Reports symptoms have persisted. Having urinary frequency, odor, and burning. Has a lab appointment in the office tomorrow at 2 pm. Asking to have a urine culture performed. Advised will need to see MD for evaluation. Patient declines. Advised will review with MD and return call.

## 2018-04-07 ENCOUNTER — Ambulatory Visit: Payer: Medicare Other

## 2018-04-07 ENCOUNTER — Other Ambulatory Visit: Payer: Medicare Other

## 2018-04-07 DIAGNOSIS — R8290 Unspecified abnormal findings in urine: Secondary | ICD-10-CM | POA: Diagnosis not present

## 2018-04-07 DIAGNOSIS — E559 Vitamin D deficiency, unspecified: Secondary | ICD-10-CM

## 2018-04-07 NOTE — Telephone Encounter (Signed)
Left message to call Kaitlyn at [phone].  Appointment changed to nurse visit at 2 pm for urine check and labs.

## 2018-04-07 NOTE — Progress Notes (Unsigned)
Patient states that she is still having symptoms of Urin odor and frequency and burning. Urin micro and culture along with vitamin D labs drawn.

## 2018-04-08 LAB — URINE CULTURE

## 2018-04-08 LAB — URINALYSIS, MICROSCOPIC ONLY: CASTS: NONE SEEN /LPF

## 2018-04-08 LAB — VITAMIN D 25 HYDROXY (VIT D DEFICIENCY, FRACTURES): Vit D, 25-Hydroxy: 20.4 ng/mL — ABNORMAL LOW (ref 30.0–100.0)

## 2018-04-09 NOTE — Telephone Encounter (Signed)
Patient seen in office on 04/07/18 for nurse visit. Encounter closed.

## 2018-04-12 ENCOUNTER — Ambulatory Visit (INDEPENDENT_AMBULATORY_CARE_PROVIDER_SITE_OTHER): Payer: Medicare Other | Admitting: Adult Health

## 2018-04-12 ENCOUNTER — Encounter: Payer: Self-pay | Admitting: Adult Health

## 2018-04-12 VITALS — BP 128/80 | HR 101 | Temp 97.8°F | Resp 14 | Ht 67.0 in | Wt 193.0 lb

## 2018-04-12 DIAGNOSIS — W57XXXA Bitten or stung by nonvenomous insect and other nonvenomous arthropods, initial encounter: Secondary | ICD-10-CM | POA: Diagnosis not present

## 2018-04-12 DIAGNOSIS — S30861A Insect bite (nonvenomous) of abdominal wall, initial encounter: Secondary | ICD-10-CM

## 2018-04-12 MED ORDER — DOXYCYCLINE HYCLATE 100 MG PO CAPS: ORAL_CAPSULE | ORAL | 0 refills | Status: DC

## 2018-04-12 NOTE — Patient Instructions (Addendum)
Get topical benadryl gel and apply to insect bites  Doxycycline tablets or capsules What is this medicine? DOXYCYCLINE (dox i SYE kleen) is a tetracycline antibiotic. It kills certain bacteria or stops their growth. It is used to treat many kinds of infections, like dental, skin, respiratory, and urinary tract infections. It also treats acne, Lyme disease, malaria, and certain sexually transmitted infections. This medicine may be used for other purposes; ask your health care provider or pharmacist if you have questions. COMMON BRAND NAME(S): Acticlate, Adoxa, Adoxa CK, Adoxa Pak, Adoxa TT, Alodox, Avidoxy, Doxal, Mondoxyne NL, Monodox, Morgidox 1x, Morgidox 1x Kit, Morgidox 2x, Morgidox 2x Kit, NutriDox, Ocudox, TARGADOX, Vibra-Tabs, Vibramycin What should I tell my health care provider before I take this medicine? They need to know if you have any of these conditions: -liver disease -long exposure to sunlight like working outdoors -stomach problems like colitis -an unusual or allergic reaction to doxycycline, tetracycline antibiotics, other medicines, foods, dyes, or preservatives -pregnant or trying to get pregnant -breast-feeding How should I use this medicine? Take this medicine by mouth with a full glass of water. Follow the directions on the prescription label. It is best to take this medicine without food, but if it upsets your stomach take it with food. Take your medicine at regular intervals. Do not take your medicine more often than directed. Take all of your medicine as directed even if you think you are better. Do not skip doses or stop your medicine early. Talk to your pediatrician regarding the use of this medicine in children. While this drug may be prescribed for selected conditions, precautions do apply. Overdosage: If you think you have taken too much of this medicine contact a poison control center or emergency room at once. NOTE: This medicine is only for you. Do not share this  medicine with others. What if I miss a dose? If you miss a dose, take it as soon as you can. If it is almost time for your next dose, take only that dose. Do not take double or extra doses. What may interact with this medicine? -antacids -barbiturates -birth control pills -bismuth subsalicylate -carbamazepine -methoxyflurane -other antibiotics -phenytoin -vitamins that contain iron -warfarin This list may not describe all possible interactions. Give your health care provider a list of all the medicines, herbs, non-prescription drugs, or dietary supplements you use. Also tell them if you smoke, drink alcohol, or use illegal drugs. Some items may interact with your medicine. What should I watch for while using this medicine? Tell your doctor or health care professional if your symptoms do not improve. Do not treat diarrhea with over the counter products. Contact your doctor if you have diarrhea that lasts more than 2 days or if it is severe and watery. Do not take this medicine just before going to bed. It may not dissolve properly when you lay down and can cause pain in your throat. Drink plenty of fluids while taking this medicine to also help reduce irritation in your throat. This medicine can make you more sensitive to the sun. Keep out of the sun. If you cannot avoid being in the sun, wear protective clothing and use sunscreen. Do not use sun lamps or tanning beds/booths. Birth control pills may not work properly while you are taking this medicine. Talk to your doctor about using an extra method of birth control. If you are being treated for a sexually transmitted infection, avoid sexual contact until you have finished your treatment. Your sexual partner  may also need treatment. Avoid antacids, aluminum, calcium, magnesium, and iron products for 4 hours before and 2 hours after taking a dose of this medicine. If you are using this medicine to prevent malaria, you should still protect  yourself from contact with mosquitos. Stay in screened-in areas, use mosquito nets, keep your body covered, and use an insect repellent. What side effects may I notice from receiving this medicine? Side effects that you should report to your doctor or health care professional as soon as possible: -allergic reactions like skin rash, itching or hives, swelling of the face, lips, or tongue -difficulty breathing -fever -itching in the rectal or genital area -pain on swallowing -redness, blistering, peeling or loosening of the skin, including inside the mouth -severe stomach pain or cramps -unusual bleeding or bruising -unusually weak or tired -yellowing of the eyes or skin Side effects that usually do not require medical attention (report to your doctor or health care professional if they continue or are bothersome): -diarrhea -loss of appetite -nausea, vomiting This list may not describe all possible side effects. Call your doctor for medical advice about side effects. You may report side effects to FDA at 1-800-FDA-1088. Where should I keep my medicine? Keep out of the reach of children. Store at room temperature, below 30 degrees C (86 degrees F). Protect from light. Keep container tightly closed. Throw away any unused medicine after the expiration date. Taking this medicine after the expiration date can make you seriously ill. NOTE: This sheet is a summary. It may not cover all possible information. If you have questions about this medicine, talk to your doctor, pharmacist, or health care provider.  2018 Elsevier/Gold Standard (2015-10-31 17:11:22)

## 2018-04-12 NOTE — Progress Notes (Signed)
Assessment and Plan:  Susan Bartlett was seen today for acute visit.  Diagnoses and all orders for this visit:  Tick bite of abdomen, initial encounter Unsure of length of attachment, will proceed with 1 week of doxycycline treatment for appearance of mild cellulitis and due to patient anxiety Monitor and report if not improving -     doxycycline (VIBRAMYCIN) 100 MG capsule; Take 1 capsule 2 x/day with food for 7 days.  Other insect bites Discussed appropriate care for insect bites to hands; apply ice, suggested benadryl at night, topical benadryl gel during the day, cool baths - cannot determine exact insect from lesions, reassured not typical for spider bite, scabies, etc.  Cannot rule out fleas vs beg bugs-  Recommend consider exterminator at home.   Further disposition pending results of labs. Discussed med's effects and SE's.   Over 15 minutes of exam, counseling, chart review, and critical decision making was performed.   Future Appointments  Date Time Provider Department Center  06/09/2018 11:30 AM Quentin Mullingollier, Amanda, PA-C [facility] None  11/18/2018 10:00 AM Quentin Mullingollier, Amanda, PA-C [facility] None  04/22/2019  2:45 PM Jerene BearsMiller, [name], MD [facility] None    ------------------------------------------------------------------------------------------------------------------   HPI BP 128/80   Pulse (!) 101   Temp 97.8 F (36.6 C)   Resp 14   Ht 5\' 7"  (1.702 m)   Wt 193 lb (87.5 kg)   LMP 08/04/2012   SpO2 98%   BMI 30.23 kg/m   52 y.o.female , quite anxious, presents for evaluation of a tick bite and insect bites to hands with multiple photos to demonstrate. She reports she noted a tick on lower abdomen 5 days ago on 04/07/2018, not sure how long the tick was present as she was not out in yard, hiking, etc recently. She removed tick with tweezers and applied alcohol but now presents with increasingly injected area surrounding bite - ~2cm area.   She also presents with multiple discrete  injected/raised, pruritic lesions to her hands. She reports they appear overnight and resolve within 5-7 days. Has been applying ice, topical lidocaine which is helpful. She is anxious to make sure these are not contagious as she is visiting family this weekend. Lesions appear classic insect bites, without tunneling or signs of infection. Reassured.   She did have her bed evaluated for bed bugs and was reportedly negative. She does endorse she sleeps with her hands   Past Medical History:  Diagnosis Date  . Anal fissure 1994  . Anxiety   . [facility]th[facility]    [facility]  . Depression   . Endometriosis   . Fracture 08/2011   fracture left wrist and elbow  . Hemorrhoids 2008  . Hepatomegaly   . Hypothyroidism   . Mixed hyperlipidemia   . Obesity   . OCD (obsessive compulsive disorder)   . Panic disorder   . Plantar fasciitis, right 03/2014  . RLS (restless legs syndrome) 2014   vis sleep study- no sleep apnea  . Shingles 07/2017  . Tobacco abuse    not tolerated Chantix in the past  . Vitamin D deficiency      No Known Allergies  Current Outpatient Medications on File Prior to Visit  Medication Sig  . Cholecalciferol (VITAMIN D3) 5000 units CAPS Take by mouth daily.  . clonazePAM (KLONOPIN) 1 MG tablet Take 2 mg by mouth 3 (three) times daily. Take 1/2 by mouth every am, 1/2 at lunch, and 1 every night  . ibuprofen (ADVIL,MOTRIN)  200 MG tablet Take 200 mg by mouth every 6 (six) hours as needed.  Marland Kitchen levothyroxine (SYNTHROID, LEVOTHROID) 75 MCG tablet TAKE 1 TABLET BY MOUTH ONCE DAILY  . sertraline (ZOLOFT) 100 MG tablet Take 200 mg by mouth 2 (two) times daily.   . rosuvastatin (CRESTOR) 40 MG tablet Take 1/2 to 1 tablet daily or as directed for Cholesterol (Patient not taking: Reported on 04/12/2018)   No current facility-administered medications on file prior to visit.     ZOX:WRUEAV of Systems  Constitutional: Negative for chills, fever, malaise/fatigue  and weight loss.  HENT: Negative.   Eyes: Negative for blurred vision and double vision.  Respiratory: Negative for cough, shortness of breath and wheezing.   Cardiovascular: Negative for chest pain and palpitations.  Gastrointestinal: Negative for abdominal pain, blood in stool, constipation, diarrhea, heartburn, melena, nausea and vomiting.  Musculoskeletal: Negative for joint pain and myalgias.  Skin: Positive for itching and rash.  Neurological: Negative for dizziness and headaches.  Psychiatric/Behavioral: The patient is nervous/anxious.       Physical Exam:  BP 128/80   Pulse (!) 101   Temp 97.8 F (36.6 C)   Resp 14   Ht 5\' 7"  (1.702 m)   Wt 193 lb (87.5 kg)   LMP 08/04/2012   SpO2 98%   BMI 30.23 kg/m   General Appearance: Well nourished, in no apparent distress. Eyes: PERRLA, EOMs, conjunctiva no swelling or erythema Sinuses: No Frontal/maxillary tenderness ENT/Mouth: Ext aud canals clear, TMs without erythema, bulging. No erythema, swelling, or exudate on post pharynx.  Tonsils not swollen or erythematous. Hearing normal.  Neck: Supple, thyroid normal.  Respiratory: Respiratory effort normal, BS equal bilaterally without rales, rhonchi, wheezing or stridor.  Cardio: RRR with no MRGs. Brisk peripheral pulses without edema.  Abdomen: Soft, + BS.  Non tender, no guarding, rebound, hernias, masses. Lymphatics: Non tender without lymphadenopathy.  Musculoskeletal: Full ROM, 5/5 strength, normal gait.  Skin: Warm, dry; ~2 cm injected area to right lower abdomen; 3-4 injected areas to hands with central scab; newer lesions with firm subcutaneous swelling; healing lesions have mild remaining injection with small center scab from excoriation.  Psych: Awake and oriented X 3, anxious affect, Insight and Judgment appropriate.     Dan Maker, NP 3:02 PM [facility]

## 2018-06-07 NOTE — Progress Notes (Deleted)
Patient ID: Susan Bartlett, female   DOB: Feb 11, 1966, 52 y.o.   MRN: [id number] Assessment and Plan:   Hypertension -Continue medication, monitor blood pressure at home and call if consistently above 140/90. Continue DASH diet.  Reminder to go to the ER if any CP, SOB, nausea, dizziness, severe HA, changes vision/speech, left arm numbness and tingling and jaw pain.  Cholesterol -Continue diet and exercise. Check cholesterol.    Prediabetes  -Continue diet and exercise. Check A1C  Vitamin D Def - check level and continue medications.  On 50,000 IU daily   Continue diet and meds as discussed. Further disposition pending results of labs. Over 30 minutes of exam, counseling, chart review, and critical decision making was performed  HPI 52 y.o. female  presents for 3 month follow up on hypertension, cholesterol, prediabetes, and vitamin D deficiency.   Her blood pressure has been controlled at home, today their BP is    She does not workout. She denies chest pain, shortness of breath, dizziness.  She is on cholesterol medication and denies myalgias. Her cholesterol is at goal. The cholesterol last visit was:   Lab Results  Component Value Date   CHOL 195 03/05/2018   HDL 48 (L) 03/05/2018   LDLCALC 122 (H) 03/05/2018   TRIG 137 03/05/2018   CHOLHDL 4.1 03/05/2018    Last A1C in the office was:  Lab Results  Component Value Date   HGBA1C 5.3 03/05/2018   Patient is on Vitamin D supplement, 50,000 IU was added twice a week.   Lab Results  Component Value Date   VD25OH 20.4 (L) 04/07/2018    She is on thyroid medication. Her medication was changed last visit, she was put back on once a day.    Lab Results  Component Value Date   TSH 2.740 01/27/2018   She has very BMI is There is no height or weight on file to calculate BMI., she is working on diet and exercise. Wt Readings from Last 3 Encounters:  04/12/18 193 lb (87.5 kg)  04/07/18 192 lb 6.4 oz (87.3 kg)  03/05/18 192 lb 3.2  oz (87.2 kg)   She has imbalance, had normal MRI 2011, she has known lower back pain. Normal stress test 2014.    Current Medications:  Current Outpatient Medications on File Prior to Visit  Medication Sig Dispense Refill  . Cholecalciferol (VITAMIN D3) 5000 units CAPS Take by mouth daily.    . clonazePAM (KLONOPIN) 1 MG tablet Take 2 mg by mouth 3 (three) times daily. Take 1/2 by mouth every am, 1/2 at lunch, and 1 every night    . doxycycline (VIBRAMYCIN) 100 MG capsule Take 1 capsule 2 x/day with food for 7 days. 14 capsule 0  . ibuprofen (ADVIL,MOTRIN) 200 MG tablet Take 200 mg by mouth every 6 (six) hours as needed.    Marland Kitchen. levothyroxine (SYNTHROID, LEVOTHROID) 75 MCG tablet TAKE 1 TABLET BY MOUTH ONCE DAILY 90 tablet 3  . rosuvastatin (CRESTOR) 40 MG tablet Take 1/2 to 1 tablet daily or as directed for Cholesterol (Patient not taking: Reported on 04/12/2018) 90 tablet 1  . sertraline (ZOLOFT) 100 MG tablet Take 200 mg by mouth 2 (two) times daily.      No current facility-administered medications on file prior to visit.    Medical History:  Past Medical History:  Diagnosis Date  . Anal fissure 1994  . Anxiety   . Arthritis    [facility]  .  Depression   . Endometriosis   . Fracture 08/2011   fracture left wrist and elbow  . Hemorrhoids 2008  . Hepatomegaly   . Hypothyroidism   . Mixed hyperlipidemia   . Obesity   . OCD (obsessive compulsive disorder)   . Panic disorder   . Plantar fasciitis, right 03/2014  . RLS (restless legs syndrome) 2014   vis sleep study- no sleep apnea  . Shingles 07/2017  . Tobacco abuse    not tolerated Chantix in the past  . Vitamin D deficiency    Allergies: No Known Allergies   Review of Systems:  Review of Systems  Constitutional: Negative for chills, diaphoresis, fever, malaise/fatigue and weight loss.  Eyes: Negative.   Respiratory: Negative.   Cardiovascular: Negative.   Gastrointestinal: Negative for  abdominal pain, blood in stool, constipation, diarrhea, melena and vomiting.  Genitourinary: Negative.   Musculoskeletal: Positive for back pain, joint pain and neck pain. Negative for falls and myalgias.  Skin: Negative.  Negative for rash.  Neurological: Negative.  Negative for weakness.  Psychiatric/Behavioral: Negative for depression, hallucinations, memory loss, substance abuse and suicidal ideas. The patient is not nervous/anxious and does not have insomnia.     Family history- Review and unchanged Social history- Review and unchanged Physical Exam: LMP 08/04/2012  Wt Readings from Last 3 Encounters:  04/12/18 193 lb (87.5 kg)  04/07/18 192 lb 6.4 oz (87.3 kg)  03/05/18 192 lb 3.2 oz (87.2 kg)   General Appearance: Well nourished, in no apparent distress. Eyes: PERRLA, EOMs, conjunctiva no swelling or erythema Sinuses: No Frontal/maxillary tenderness ENT/Mouth: Ext aud canals clear, TMs without erythema, bulging. No erythema, swelling, or exudate on post pharynx.  Tonsils not swollen or erythematous. Hearing normal.  Neck: Supple, thyroid normal.  Respiratory: Respiratory effort normal, BS equal bilaterally without rales, rhonchi, wheezing or stridor.  Cardio: RRR with no MRGs. Brisk peripheral pulses without edema.  Abdomen: Soft, + BS,  diffusely tender, no guarding, rebound, hernias, masses. Lymphatics: Non tender without lymphadenopathy.  Musculoskeletal: Full ROM, 5/5 strength, Normal gait Skin: Warm, dry without rashes, lesions, ecchymosis.  Neuro: Cranial nerves intact. Normal muscle tone, no cerebellar symptoms. Psych: Awake and oriented X 3, normal affect, Insight and Judgment appropriate.    Quentin Mulling, PA-C 1:45 PM [facility]

## 2018-06-09 ENCOUNTER — Ambulatory Visit: Payer: Self-pay | Admitting: Physician Assistant

## 2018-07-06 ENCOUNTER — Telehealth: Payer: Self-pay | Admitting: Physician Assistant

## 2018-07-06 NOTE — Telephone Encounter (Signed)
Start on 1/2 of the medication and we will recheck at next OV where you are at.

## 2018-07-06 NOTE — Telephone Encounter (Signed)
Pt reports she has not been eating right & has not been working out but since getting her lab results back she has started to eat right & will start working out again. She would to work on her LDL's and stay on the pravastatin for now. She states that if nothing has changed on her follow up labs then she can switch to the CRESTOR please. If you agree to this please send in a rx for pravastatin because she has ran out of them.

## 2018-07-06 NOTE — Telephone Encounter (Signed)
-----   Message from Gregery NaAngela D Duff, CMA sent at 07/06/2018 10:31 AM EDT ----- Regarding: MED QUESTION Pt reports she was taken off pravastatin & then was prescribed Crestor. Pt has not started the Crestor yet due to the pharmacy telling her that she was given a high dose 20 mgs. She reports the pharmacy stated that they usually start pt's off with 10 mgs instead.

## 2018-07-07 ENCOUNTER — Other Ambulatory Visit: Payer: Self-pay | Admitting: *Deleted

## 2018-07-07 MED ORDER — PRAVASTATIN SODIUM 40 MG PO TABS: 40.0000 mg | ORAL_TABLET | Freq: Every day | ORAL | 0 refills | Status: DC

## 2018-07-07 NOTE — Telephone Encounter (Signed)
Per Dr Oneta Rack, the patient can continue the Pravastatin for now, but needs to be strict with a low cholesterol diet. She was informed to avoid beef, pork, eggs and dairy,especially cheese.  A refill for her Pravastatin was sent to her pharmacy. The patient has restarted exercising and will eat a better diet.

## 2018-07-07 NOTE — Telephone Encounter (Signed)
Please discuss with Dr. Oneta Rack, he switched it

## 2018-07-15 NOTE — Progress Notes (Signed)
Patient ID: Susan Bartlett, female   DOB: 1966/10/09, 52 y.o.   MRN: [id number] Assessment and Plan:   Hypertension -Continue medication, monitor blood pressure at home and call if consistently above 140/90. Continue DASH diet.  Reminder to go to the ER if any CP, SOB, nausea, dizziness, severe HA, changes vision/speech, left arm numbness and tingling and jaw pain.  Cholesterol -Continue diet and exercise. Check cholesterol.  Switch pravastatin to crestor low dose Patient has high anxiety about new medications   Prediabetes  -Continue diet and exercise. Check A1C  Vitamin D Def - check level and continue medications.   Imbalance Likely from effusion- get on medications, has seen ENT in the past Normal neuro Went over symptoms to watch out for Stop using google, patient counseled.   Anxiety Continue medciations, suggest CBT therapy  Continue diet and meds as discussed. Further disposition pending results of labs. Over 30 minutes of exam, counseling, chart review, and critical decision making was performed Future Appointments  Date Time Provider Department Center  07/27/2018  2:00 PM Cottle, Steva Ready., MD [facility] None  11/18/2018 10:00 AM Quentin Mulling, PA-C [facility] None  04/22/2019  2:45 PM Jerene Bears, MD [facility] None    HPI 52 y.o. female  presents for 3 month follow up on hypertension, cholesterol, prediabetes, and vitamin D deficiency.   She has some fluid in bilateral ears, has imbalance, has seen ENT in the past, not on any medications at this time. She is very anxious about medicine/doctors and monitors google for her symptoms. She has no HA, no fever, chills, no vision changes, no weakness.    Her blood pressure has been controlled at home, today their BP is BP: 132/74  She does not workout. She denies chest pain, shortness of breath, dizziness.  BMI is Body mass index is 31.7 kg/m., she is working on diet and exercise. Wt Readings from Last 3 Encounters:   07/19/18 202 lb 6.4 oz (91.8 kg)  04/12/18 193 lb (87.5 kg)  04/07/18 192 lb 6.4 oz (87.3 kg)     She is on cholesterol medication and denies myalgias. Her cholesterol is at goal. The cholesterol last visit was:   Lab Results  Component Value Date   CHOL 195 03/05/2018   HDL 48 (L) 03/05/2018   LDLCALC 122 (H) 03/05/2018   TRIG 137 03/05/2018   CHOLHDL 4.1 03/05/2018    Last A1C in the office was:  Lab Results  Component Value Date   HGBA1C 5.3 03/05/2018   Patient is on Vitamin D supplement, 50,000 IU was added twice a week.   Lab Results  Component Value Date   VD25OH 20.4 (L) 04/07/2018    She is on thyroid medication.  Lab Results  Component Value Date   TSH 2.740 01/27/2018    Current Medications:  Current Outpatient Medications on File Prior to Visit  Medication Sig Dispense Refill  . Cholecalciferol (VITAMIN D3) 5000 units CAPS Take by mouth daily.    . clonazePAM (KLONOPIN) 1 MG tablet Take 1/2 tablet by mouth three times a day,1 tablet at bedtime, and can have an additional 1/2 tablet every day as needed 90 tablet 1  . ibuprofen (ADVIL,MOTRIN) 200 MG tablet Take 200 mg by mouth every 6 (six) hours as needed.    Marland Kitchen levothyroxine (SYNTHROID, LEVOTHROID) 75 MCG tablet TAKE 1 TABLET BY MOUTH ONCE DAILY 90 tablet 3  . pravastatin (PRAVACHOL) 40 MG tablet Take 1 tablet (40 mg total) by mouth  daily. 90 tablet 0  . sertraline (ZOLOFT) 100 MG tablet Take 200 mg by mouth 2 (two) times daily.      No current facility-administered medications on file prior to visit.    Medical History:  Past Medical History:  Diagnosis Date  . Anal fissure 1994  . Anxiety   . Arthritis    Dr. Penni Bombard at [facility]  . Depression   . Endometriosis   . Fracture 08/2011   fracture left wrist and elbow  . Hemorrhoids 2008  . Hepatomegaly   . Hypothyroidism   . Mixed hyperlipidemia   . Obesity   . OCD (obsessive compulsive disorder)   . Panic disorder   . Plantar  fasciitis, right 03/2014  . RLS (restless legs syndrome) 2014   vis sleep study- no sleep apnea  . Shingles 07/2017  . Tobacco abuse    not tolerated Chantix in the past  . Vitamin D deficiency    Allergies: No Known Allergies   Review of Systems:  Review of Systems  Constitutional: Negative for chills, diaphoresis, fever, malaise/fatigue and weight loss.  Eyes: Negative.   Respiratory: Negative.   Cardiovascular: Negative.   Gastrointestinal: Negative for abdominal pain, blood in stool, constipation, diarrhea, melena and vomiting.  Genitourinary: Negative.   Musculoskeletal: Positive for back pain, joint pain and neck pain. Negative for falls and myalgias.  Skin: Negative.  Negative for rash.  Neurological: Negative.  Negative for weakness.  Psychiatric/Behavioral: Negative for depression, hallucinations, memory loss, substance abuse and suicidal ideas. The patient is not nervous/anxious and does not have insomnia.     Family history- Review and unchanged Social history- Review and unchanged Physical Exam: BP 132/74   Pulse 88   Temp 97.8 F (36.6 C)   Resp 16   Ht 5\' 7"  (1.702 m)   Wt 202 lb 6.4 oz (91.8 kg)   LMP 08/04/2012   SpO2 96%   BMI 31.70 kg/m  Wt Readings from Last 3 Encounters:  07/19/18 202 lb 6.4 oz (91.8 kg)  04/12/18 193 lb (87.5 kg)  04/07/18 192 lb 6.4 oz (87.3 kg)   General Appearance: Well nourished, in no apparent distress. Eyes: PERRLA, EOMs, conjunctiva no swelling or erythema Sinuses: No Frontal/maxillary tenderness ENT/Mouth: Ext aud canals clear, TMs without erythema, bulging. No erythema, swelling, or exudate on post pharynx.  Tonsils not swollen or erythematous. Hearing normal.  Neck: Supple, thyroid normal.  Respiratory: Respiratory effort normal, BS equal bilaterally without rales, rhonchi, wheezing or stridor.  Cardio: RRR with no MRGs. Brisk peripheral pulses without edema.  Abdomen: Soft, + BS,  diffusely tender, no guarding,  rebound, hernias, masses. Lymphatics: Non tender without lymphadenopathy.  Musculoskeletal: Full ROM, 5/5 strength, Normal gait Skin: Warm, dry without rashes, lesions, ecchymosis.  Neuro: Cranial nerves intact. Normal muscle tone, no cerebellar symptoms. Psych: Awake and oriented X 3, normal affect, Insight and Judgment appropriate.    Quentin Mulling, PA-C 2:32 PM [facility]

## 2018-07-17 ENCOUNTER — Other Ambulatory Visit: Payer: Self-pay

## 2018-07-17 MED ORDER — CLONAZEPAM 1 MG PO TABS: ORAL_TABLET | ORAL | 1 refills | Status: DC

## 2018-07-19 ENCOUNTER — Ambulatory Visit (INDEPENDENT_AMBULATORY_CARE_PROVIDER_SITE_OTHER): Payer: Medicare Other | Admitting: Physician Assistant

## 2018-07-19 ENCOUNTER — Encounter: Payer: Self-pay | Admitting: Physician Assistant

## 2018-07-19 VITALS — BP 132/74 | HR 88 | Temp 97.8°F | Resp 16 | Ht 67.0 in | Wt 202.4 lb

## 2018-07-19 DIAGNOSIS — R0989 Other specified symptoms and signs involving the circulatory and respiratory systems: Secondary | ICD-10-CM | POA: Diagnosis not present

## 2018-07-19 DIAGNOSIS — R7303 Prediabetes: Secondary | ICD-10-CM | POA: Diagnosis not present

## 2018-07-19 DIAGNOSIS — F325 Major depressive disorder, single episode, in full remission: Secondary | ICD-10-CM

## 2018-07-19 DIAGNOSIS — Z79899 Other long term (current) drug therapy: Secondary | ICD-10-CM

## 2018-07-19 DIAGNOSIS — E782 Mixed hyperlipidemia: Secondary | ICD-10-CM

## 2018-07-19 DIAGNOSIS — F17200 Nicotine dependence, unspecified, uncomplicated: Secondary | ICD-10-CM | POA: Diagnosis not present

## 2018-07-19 MED ORDER — ROSUVASTATIN CALCIUM 5 MG PO TABS: 5.0000 mg | ORAL_TABLET | Freq: Every day | ORAL | 11 refills | Status: DC

## 2018-07-19 NOTE — Patient Instructions (Addendum)
Check out  Mini habits for weight loss book Try to start walking/working out again and see if this helps your balance.   Your ears and sinuses are connected by the eustachian tube. When your sinuses are inflamed, this can close off the tube and cause fluid to collect in your middle ear. This can then cause dizziness, popping, clicking, ringing, and echoing in your ears. This is often NOT an infection and does NOT require antibiotics, it is caused by inflammation so the treatments help the inflammation. This can take a long time to get better so please be patient.  Here are things you can do to help with this: - Try the Flonase or Nasonex. Remember to spray each nostril twice towards the outer part of your eye.  Do not sniff but instead pinch your nose and tilt your head back to help the medicine get into your sinuses.  The best time to do this is at bedtime.Stop if you get blurred vision or nose bleeds.  -While drinking fluids, pinch and hold nose close and swallow, to help open eustachian tubes to drain fluid behind ear drums. -Please pick one of the over the counter allergy medications below and take it once daily for allergies.  It will also help with fluid behind ear drums. Claritin or loratadine cheapest but likely the weakest  Zyrtec or certizine at night because it can make you sleepy The strongest is allegra or fexafinadine  Cheapest at [employer], [employer], [employer] -can use decongestant over the counter, please do not use if you have high blood pressure or certain heart conditions.   if worsening HA, changes vision/speech, imbalance, weakness go to the ER   2 apps for tracking food is myfitness pal  loseit OR can take picture of your food     When it comes to diets, agreement about the perfect plan isn't easy to find, even among the experts. Experts at the [facility] of [employer] developed an idea known as the Healthy Eating Plate. Just imagine a plate divided into logical,  healthy portions.  The emphasis is on diet quality:  Load up on vegetables and fruits - one-half of your plate: Aim for color and variety, and remember that potatoes don't count.  Go for whole grains - one-quarter of your plate: Whole wheat, barley, wheat berries, quinoa, oats, brown rice, and foods made with them. If you want pasta, go with whole wheat pasta.  Protein power - one-quarter of your plate: Fish, chicken, beans, and nuts are all healthy, versatile protein sources. Limit red meat.  The diet, however, does go beyond the plate, offering a few other suggestions.  Use healthy plant oils, such as olive, canola, soy, corn, sunflower and peanut. Check the labels, and avoid partially hydrogenated oil, which have unhealthy trans fats.  If you're thirsty, drink water. Coffee and tea are good in moderation, but skip sugary drinks and limit milk and dairy products to one or two daily servings.  The type of carbohydrate in the diet is more important than the amount. Some sources of carbohydrates, such as vegetables, fruits, whole grains, and beans-are healthier than others.  Finally, stay active.

## 2018-07-20 LAB — COMPLETE METABOLIC PANEL WITH GFR
AG Ratio: 2.1 (calc) (ref 1.0–2.5)
ALBUMIN MSPROF: 4.8 g/dL (ref 3.6–5.1)
ALKALINE PHOSPHATASE (APISO): 95 U/L (ref 33–130)
ALT: 13 U/L (ref 6–29)
AST: 15 U/L (ref 10–35)
BUN: 9 mg/dL (ref 7–25)
CO2: 27 mmol/L (ref 20–32)
CREATININE: 0.71 mg/dL (ref 0.50–1.05)
Calcium: 10 mg/dL (ref 8.6–10.4)
Chloride: 103 mmol/L (ref 98–110)
GFR, Est African American: 113 mL/min/{1.73_m2} (ref >=60)
GFR, Est Non African American: 97 mL/min/{1.73_m2} (ref >=60)
GLOBULIN: 2.3 g/dL (ref 1.9–3.7)
GLUCOSE: 85 mg/dL (ref 65–99)
Potassium: 4.4 mmol/L (ref 3.5–5.3)
SODIUM: 138 mmol/L (ref 135–146)
Total Bilirubin: 0.5 mg/dL (ref 0.2–1.2)
Total Protein: 7.1 g/dL (ref 6.1–8.1)

## 2018-07-20 LAB — CBC WITH DIFFERENTIAL/PLATELET
BASOS ABS: 62 {cells}/uL (ref 0–200)
Basophils Relative: 0.8 %
EOS PCT: 5.4 %
Eosinophils Absolute: 416 cells/uL (ref 15–500)
HCT: 42.9 % (ref 35.0–45.0)
Hemoglobin: 14.5 g/dL (ref 11.7–15.5)
Lymphs Abs: 1632 cells/uL (ref 850–3900)
MCH: 30.4 pg (ref 27.0–33.0)
MCHC: 33.8 g/dL (ref 32.0–36.0)
MCV: 89.9 fL (ref 80.0–100.0)
MPV: 11.1 fL (ref 7.5–12.5)
Monocytes Relative: 5.2 %
NEUTROS PCT: 67.4 %
Neutro Abs: 5190 cells/uL (ref 1500–7800)
Platelets: 281 10*3/uL (ref 140–400)
RBC: 4.77 10*6/uL (ref 3.80–5.10)
RDW: 13.1 % (ref 11.0–15.0)
Total Lymphocyte: 21.2 %
WBC mixed population: 400 cells/uL (ref 200–950)
WBC: 7.7 10*3/uL (ref 3.8–10.8)

## 2018-07-20 LAB — LIPID PANEL
CHOL/HDL RATIO: 4 (calc) (ref <5.0)
Cholesterol: 186 mg/dL (ref <200)
HDL: 46 mg/dL — ABNORMAL LOW (ref >50)
LDL CHOLESTEROL (CALC): 110 mg/dL — AB
NON-HDL CHOLESTEROL (CALC): 140 mg/dL — AB (ref <130)
TRIGLYCERIDES: 187 mg/dL — AB (ref <150)

## 2018-07-20 LAB — HEMOGLOBIN A1C
Hgb A1c MFr Bld: 5.3 % of total Hgb (ref <5.7)
MEAN PLASMA GLUCOSE: 105 (calc)
eAG (mmol/L): 5.8 (calc)

## 2018-07-20 LAB — TSH: TSH: 3.24 m[IU]/L

## 2018-07-20 LAB — MAGNESIUM: Magnesium: 1.8 mg/dL (ref 1.5–2.5)

## 2018-07-27 ENCOUNTER — Ambulatory Visit: Payer: Medicare Other | Admitting: Psychiatry

## 2018-07-27 DIAGNOSIS — F422 Mixed obsessional thoughts and acts: Secondary | ICD-10-CM

## 2018-07-27 DIAGNOSIS — F4001 Agoraphobia with panic disorder: Secondary | ICD-10-CM | POA: Diagnosis not present

## 2018-07-27 DIAGNOSIS — F40298 Other specified phobia: Secondary | ICD-10-CM | POA: Diagnosis not present

## 2018-07-27 NOTE — Progress Notes (Signed)
[facility]  Patient ID: Susan Bartlett,  MRN: [id number]  PCP: Lucky Cowboy, MD  Date of Evaluation: 07/27/2018 Time spent:30 minutes   HISTORY/CURRENT STATUS: HPI CC: Anxiety and problems with generic clonazepam Pleased that Teva generic clonazepam is available again.  I can tell a big difference", feels more alert and less anxious.  Was anxious all the time with the other generics and was obs counting and having somatic fear of the heart px.  Better energy now.  Able to take less of the clonazepam using the Teva generic.  Chronic severe anxiety.  Sleep overall better but not normal. Go right to sleep, then EFA nocturia.  Recognizes too sedentary.  The longer on the other generic the more fearful she became of doing things.  Still avoidant but improving.  Chronic fear of meds.   Previous medication trials include topiramate, gabapentin, risperidone, cyclobenzaprine, quetiapine, alprazolam, buspirone, paroxetine 50 mg, fluvoxamine, pindolol, Lexapro among others for anxiety.  She is taking ropinirole for restless legs in the past.  Individual Medical History/ Review of Systems: Changes? :Yes Saw PCP last week and trying to eat and exercise better. RLS.  Allergies: Patient has no known allergies.  Current Medications:  Current Outpatient Medications:  .  Cholecalciferol (VITAMIN D3) 5000 units CAPS, Take by mouth daily., Disp: , Rfl:  .  clonazePAM (KLONOPIN) 1 MG tablet, Take 1/2 tablet by mouth three times a day,1 tablet at bedtime, and can have an additional 1/2 tablet every day as needed, Disp: 90 tablet, Rfl: 1 .  levothyroxine (SYNTHROID, LEVOTHROID) 75 MCG tablet, TAKE 1 TABLET BY MOUTH ONCE DAILY, Disp: 90 tablet, Rfl: 3 .  rosuvastatin (CRESTOR) 5 MG tablet, Take 1 tablet (5 mg total) by mouth at bedtime., Disp: 30 tablet, Rfl: 11 .  sertraline (ZOLOFT) 100 MG tablet, Take 200 mg by mouth 2 (two) times daily. , Disp: , Rfl:  .  ibuprofen (ADVIL,MOTRIN) 200 MG  tablet, Take 200 mg by mouth every 6 (six) hours as needed., Disp: , Rfl:  Medication Side Effects: None  Family Medical/ Social History: Changes? Yes Foreclosure notice.  Fighting it. Son has anxiety and marginal function.  Not working on disability.  MENTAL HEALTH EXAM:  Last menstrual period 08/04/2012.There is no height or weight on file to calculate BMI.  General Appearance: Casual  Eye Contact:  Good  Speech:  Clear and Coherent and Normal Rate  Volume:  Normal  Mood:  Anxious  Affect:  Appropriate and Full Range  Thought Process:  Goal Directed  Orientation:  Full (Time, Place, and Person)  Thought Content: WDL and somatic fears   Suicidal Thoughts:  No  Homicidal Thoughts:  No  Memory:  Recent  Judgement:  Fair  Insight:  Fair  Psychomotor Activity:  Normal  Concentration:  Concentration: Good  Recall:  Good  Fund of Knowledge: Good  Language: Good  Akathisia:  No  AIMS (if indicated): not done  Assets:  Therapist, sports  ADL's:  Intact  Cognition: WNL  Prognosis:  Fair    DIAGNOSES:    ICD-10-CM   1. Panic disorder with agoraphobia F40.01   2. Mixed obsessional thoughts and acts F42.2   3. 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 We discussed her phobia of medications and other psych dxes.  I believe the difference she notices between the  Teva generic and other generics of clonazepam is a real and legitimate physiological effect as I have heard other patients report the same.  However we have not been able to control her anxiety despite the use of an SSRI plus benzodiazepine.  Her anxiety remains treatment resistant she is better with these medicines then without them.  She is fearful of medication changes generally.  She has had psychotherapy with limited additional progress. She remains disabled  Can  increase HS dose if needed to maintain sleep.  Disc risk of Bz LT, including dementia but her anxiety  Supportive therapy dealing with the above and her son's anxiety.    Lauraine Rinne, MD

## 2018-08-17 ENCOUNTER — Other Ambulatory Visit: Payer: Self-pay | Admitting: Psychiatry

## 2018-08-18 ENCOUNTER — Other Ambulatory Visit: Payer: Self-pay | Admitting: Psychiatry

## 2018-08-18 ENCOUNTER — Other Ambulatory Visit: Payer: Self-pay

## 2018-08-18 NOTE — Progress Notes (Signed)
Pt wants only Teva generic clonazepam.

## 2018-09-30 ENCOUNTER — Other Ambulatory Visit: Payer: Self-pay | Admitting: Internal Medicine

## 2018-11-02 DIAGNOSIS — J342 Deviated nasal septum: Secondary | ICD-10-CM | POA: Diagnosis not present

## 2018-11-02 DIAGNOSIS — J310 Chronic rhinitis: Secondary | ICD-10-CM | POA: Diagnosis not present

## 2018-11-02 DIAGNOSIS — H903 Sensorineural hearing loss, bilateral: Secondary | ICD-10-CM | POA: Diagnosis not present

## 2018-11-17 NOTE — Progress Notes (Deleted)
MEDICARE ANNUAL WELLNESS VISIT   Assessment:    Hypothyroidism, unspecified hypothyroidism type Hypothyroidism-check TSH level, continue medications the same, reminded to take on an empty stomach 30-35mins before food.  - TSH   Hepatomegaly Weight loss advised, no alcohol - Hepatic function panel  Mixed hyperlipidemia -continue medications, check lipids, decrease fatty foods, increase activity. 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   Prediabetes Discussed general issues about diabetes pathophysiology and management., Educational material distributed., Suggested low cholesterol diet., Encouraged aerobic exercise., Discussed foot care., Reminded to get yearly retinal exam. - Hemoglobin A1c - Insulin, fasting  TMJ (dislocation of temporomandibular joint), subsequent encounter information given to the patient, no gum/decrease hard foods, warm wet wash clothes, decrease stress, talk with dentist about possible night guard, can do massage, and exercise.   Hypokalemia -     BASIC METABOLIC PANEL WITH GFR  Benign paroxysmal positional vertigo due to bilateral vestibular disorder Normal neuro, if not better may get MRI mastoid/head, if worse go to ER- check labs   Over 40 minutes of exam, counseling, chart review and critical  decision making was performed  Future Appointments  Date Time Provider Department Center  11/18/2018 10:00 AM Quentin Mulling, PA-C [facility] None  01/26/2019  1:00 PM Cottle, Steva Ready., MD [facility] None  04/22/2019  2:45 PM Jerene Bears, MD [facility] None     Plan:   During the course of the visit the patient was educated and counseled about appropriate screening and preventive services including:    Pneumococcal vaccine   Influenza vaccine  Td vaccine  Screening electrocardiogram  Bone densitometry screening  Colorectal cancer screening  Diabetes screening  Glaucoma screening  Nutrition counseling   Advanced directives: requested   Subjective:  Susan Bartlett is a 53 y.o. female who presents for Medicare Annual Wellness Visit.    Her blood pressure has not been controlled at home, today their BP is  .  She does not workout. She denies chest pain, shortness of breath, dizziness.  She is on cholesterol medication, pravastatin 40 and denies myalgias. Her cholesterol is not at goal. The cholesterol last visit was:   Lab Results  Component Value Date   CHOL 186 07/19/2018   HDL 46 (L) 07/19/2018   LDLCALC 110 (H) 07/19/2018   TRIG 187 (H) 07/19/2018   CHOLHDL 4.0 07/19/2018   Last E0E in the office was:  Lab Results  Component Value Date   HGBA1C 5.3 07/19/2018   Patient is on Vitamin D supplement.   Lab Results  Component Value Date   VD25OH 20.4 (L) 04/07/2018   She is on thyroid medication. Her medication was not changed last visit.   Lab Results  Component Value Date   TSH 3.24 07/19/2018  She is on gabapentin for RLS.    BMI is There is no height or weight on file to calculate BMI., she is working on  diet and exercise. Wt Readings from Last 3 Encounters:  07/19/18 202 lb 6.4 oz (91.8 kg)  04/12/18 193 lb (87.5 kg)  04/07/18 192 lb 6.4 oz (87.3 kg)   Follows with Dr. Jennelle Humanottle and gets klonopin from them, it has changed manufatuer, having bilateral  tinnitus, some dizziness, no decreased hearing.  Will go between diarrhea/contstipation.   Medication Review: Current Outpatient Medications on File Prior to Visit  Medication Sig Dispense Refill  . Cholecalciferol (VITAMIN D3) 5000 units CAPS Take by mouth daily.    . clonazePAM (KLONOPIN) 1 MG tablet Take 1/2 tablet by mouth three times a day,1 tablet at bedtime, and can have an additional 1/2 tablet every day as needed 90 tablet 1  . ibuprofen (ADVIL,MOTRIN) 200 MG tablet Take 200 mg by mouth every 6 (six) hours as needed.    Marland Kitchen. levothyroxine (SYNTHROID, LEVOTHROID) 75 MCG tablet TAKE 1 TABLET BY MOUTH ONCE DAILY 90 tablet 3  . pravastatin (PRAVACHOL) 40 MG tablet TAKE 1 TABLET BY MOUTH ONCE DAILY ***DISCONTINUE  CRESTOR*** 90 tablet 0  . rosuvastatin (CRESTOR) 5 MG tablet Take 1 tablet (5 mg total) by mouth at bedtime. 30 tablet 11  . sertraline (ZOLOFT) 100 MG tablet TAKE 1 TABLET BY MOUTH TWICE DAILY 60 tablet 5   No current facility-administered medications on file prior to visit.     Current Problems (verified) Patient Active Problem List   Diagnosis Date Noted  . Labile hypertension 03/05/2018  . Tobacco use disorder 01/16/2015  . Prediabetes 01/16/2015  . TMJ (dislocation of temporomandibular joint) 01/16/2015  . Vitamin D deficiency   . Hepatomegaly   . Mixed hyperlipidemia   . Panic disorder   . Anal fissure   . Depression, major, in remission (HCC)   . Hypothyroidism   . Obesity     Screening Tests Immunization History  Administered Date(s) Administered  . Influenza Split 08/01/2013  . Td 11/18/2012   Preventative care: Tetanus: 11/2012 Pneumovax: N/A Prevnar 13: N/A Flu vaccines declines Zostavax: N/A  Pap:  08/2015, Dr. Hyacinth MeekerMiller MGM: 01/2016 DEXA: 2008, heel neg Colonoscopy: 11/2014 due 10 years EGD: N/A PFT: 01/2013 Stress test: 12/2012 [facility]([place]) Echo: 12/2012 CT head 2013  MRI brain 2011  Names of Other Physician/Practitioners you currently  use: 1. [facility] here for primary care 2.  [facility], eye doctor, last visit 1999 3. Dr. Lequita AsalBen Turner , dentist, last visit 6 years Patient Care Team: Lucky CowboyMcKeown, William, MD as PCP - General (Internal Medicine) Wendall StadeNishan, Peter C, MD as Consulting Physician (Cardiology) Iva BoopGessner, Carl E, MD as Consulting Physician (Gastroenterology) Jerene BearsMiller, [name], MD as Consulting Physician (Gynecology)  Allergies No Known Allergies  SURGICAL HISTORY She  has a past surgical history that includes Colonoscopy (03/25/2007); Hemorroidectomy; Cholecystectomy; Left oophorectomy; and Tonsillectomy. FAMILY HISTORY Her family history includes Alcohol abuse in her sister; Breast cancer in her cousin and mother; Cirrhosis in her father; Colon cancer (age of onset: 6850) in her maternal grandmother; Diabetes in her mother; Drug abuse in her brother; Heart disease in her father. SOCIAL HISTORY She  reports that she has been smoking cigarettes. She has been smoking about 1.00 pack per day. She has never used smokeless tobacco. She reports that she does not drink alcohol or use drugs.  MEDICARE WELLNESS OBJECTIVES: Physical activity:   Cardiac risk factors:   Depression/mood screen:   Depression screen [facility] 2/9 03/07/2018  Decreased Interest 0  Down, Depressed, Hopeless 0  PHQ - 2 Score  0  Altered sleeping -  Tired, decreased energy -  Change in appetite -  Feeling bad or failure about yourself  -  Trouble concentrating -  Moving slowly or fidgety/restless -  Suicidal thoughts -  PHQ-9 Score -  Difficult doing work/chores -    ADLs:  In your present state of health, do you have any difficulty performing the following activities: 03/07/2018  Hearing? N  Vision? N  Difficulty concentrating or making decisions? N  Walking or climbing stairs? N  Dressing or bathing? N  Doing errands, shopping? N  Some recent data might be hidden     Cognitive Testing  Alert? Yes   Normal Appearance?Yes  Oriented to person? Yes  Place? Yes   Time? Yes  Recall of three objects?  Yes  Can perform simple calculations? Yes  Displays appropriate judgment?Yes  Can read the correct time from a watch face?Yes  EOL planning:     Review of Systems  Constitutional: Positive for malaise/fatigue. Negative for chills and fever.  HENT: Negative for congestion, ear pain, hearing loss and sore throat.   Respiratory: Negative for cough, sputum production, shortness of breath and wheezing.   Cardiovascular: Negative for chest pain, palpitations and leg swelling.  Neurological: Positive for headaches.  Psychiatric/Behavioral: Positive for depression.    Objective:     Last menstrual period 08/04/2012. There is no height or weight on file to calculate BMI.  General appearance: alert, no distress, WD/WN, female HEENT: normocephalic, sclerae anicteric, TMs pearly, nares patent, no discharge or erythema, pharynx normal, + TMJ tenderness bilateral.  Oral cavity: MMM, no lesions Neck: supple, no lymphadenopathy, no thyromegaly, no masses Heart: RRR, normal S1, S2, no murmurs Lungs: CTA bilaterally, no wheezes, rhonchi, or rales Abdomen: +bs, soft, non tender, non distended, no masses, no hepatomegaly, no splenomegaly Musculoskeletal: nontender, no swelling, no obvious deformity Extremities: no edema, no cyanosis, no clubbing Pulses: 2+ symmetric, upper and lower extremities, normal cap refill Neurological: alert, oriented x 3, CN2-12 intact, strength normal upper extremities and lower extremities, sensation normal throughout, DTRs 2+ throughout, no cerebellar signs, normalgait Psychiatric: normal affect, behavior normal, pleasant   Medicare Attestation I have personally reviewed: The patient's medical and social history Their use of alcohol, tobacco or illicit drugs Their current medications and supplements The patient's functional ability including ADLs,fall risks, home  safety risks, cognitive, and hearing and visual impairment Diet and physical activities Evidence for depression or mood disorders  The patient's weight, height, BMI, and visual acuity have been recorded in the chart.  I have made referrals, counseling, and provided education to the patient based on review of the above and I have provided the patient with a written personalized care plan for preventive services.     Quentin Mulling, PA-C   11/17/2018

## 2018-11-18 ENCOUNTER — Encounter: Payer: Self-pay | Admitting: Physician Assistant

## 2018-11-24 ENCOUNTER — Ambulatory Visit (INDEPENDENT_AMBULATORY_CARE_PROVIDER_SITE_OTHER): Payer: Medicare Other | Admitting: Physician Assistant

## 2018-11-24 ENCOUNTER — Encounter: Payer: Self-pay | Admitting: Physician Assistant

## 2018-11-24 VITALS — BP 124/88 | HR 60 | Temp 98.1°F | Ht 67.0 in | Wt 215.8 lb

## 2018-11-24 DIAGNOSIS — F17200 Nicotine dependence, unspecified, uncomplicated: Secondary | ICD-10-CM

## 2018-11-24 DIAGNOSIS — E782 Mixed hyperlipidemia: Secondary | ICD-10-CM

## 2018-11-24 DIAGNOSIS — Z0001 Encounter for general adult medical examination with abnormal findings: Secondary | ICD-10-CM

## 2018-11-24 DIAGNOSIS — R0989 Other specified symptoms and signs involving the circulatory and respiratory systems: Secondary | ICD-10-CM | POA: Diagnosis not present

## 2018-11-24 DIAGNOSIS — R7309 Other abnormal glucose: Secondary | ICD-10-CM | POA: Diagnosis not present

## 2018-11-24 DIAGNOSIS — F410 Panic disorder [episodic paroxysmal anxiety] without agoraphobia: Secondary | ICD-10-CM

## 2018-11-24 DIAGNOSIS — F325 Major depressive disorder, single episode, in full remission: Secondary | ICD-10-CM | POA: Diagnosis not present

## 2018-11-24 DIAGNOSIS — E039 Hypothyroidism, unspecified: Secondary | ICD-10-CM

## 2018-11-24 DIAGNOSIS — R8290 Unspecified abnormal findings in urine: Secondary | ICD-10-CM

## 2018-11-24 DIAGNOSIS — J0100 Acute maxillary sinusitis, unspecified: Secondary | ICD-10-CM

## 2018-11-24 DIAGNOSIS — R6889 Other general symptoms and signs: Secondary | ICD-10-CM | POA: Diagnosis not present

## 2018-11-24 DIAGNOSIS — Z0000 Encounter for general adult medical examination without abnormal findings: Secondary | ICD-10-CM

## 2018-11-24 DIAGNOSIS — E559 Vitamin D deficiency, unspecified: Secondary | ICD-10-CM

## 2018-11-24 DIAGNOSIS — R160 Hepatomegaly, not elsewhere classified: Secondary | ICD-10-CM | POA: Diagnosis not present

## 2018-11-24 DIAGNOSIS — S0300XD Dislocation of jaw, unspecified side, subsequent encounter: Secondary | ICD-10-CM

## 2018-11-24 DIAGNOSIS — Z79899 Other long term (current) drug therapy: Secondary | ICD-10-CM

## 2018-11-24 MED ORDER — FLUCONAZOLE 150 MG PO TABS: 150.0000 mg | ORAL_TABLET | Freq: Every day | ORAL | 0 refills | Status: DC

## 2018-11-24 MED ORDER — DOXYCYCLINE HYCLATE 100 MG PO CAPS: ORAL_CAPSULE | ORAL | 0 refills | Status: DC

## 2018-11-24 MED ORDER — TRIAMCINOLONE ACETONIDE 0.5 % EX CREA: 1.0000 "application " | TOPICAL_CREAM | Freq: Two times a day (BID) | CUTANEOUS | 2 refills | Status: DC

## 2018-11-24 NOTE — Progress Notes (Signed)
MEDICARE ANNUAL WELLNESS VISIT   Assessment:     Hypothyroidism, unspecified hypothyroidism type Hypothyroidism-check TSH level, continue medications the same, reminded to take on an empty stomach 30-3460mins before food.  - TSH   Hepatomegaly Weight loss advised, no alcohol - Hepatic function panel  Mixed hyperlipidemia -continue medications, check lipids, decrease fatty foods, increase activity. 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 Able to add wellbutrin for weight loss/smoking will send message   Obesity Obesity with co morbidities- long discussion about weight loss, diet, and exercise Will contact Dr. Jennelle Bartlett, want to add Wellbutrin  Panic disorder Follow up psych, continue meds  Vitamin D deficiency - Magnesium - Vit D  25 hydroxy (rtn osteoporosis monitoring)  Anal fissure Controlled, increase water/fiber  Tobacco use disorder Smoking cessation-  instruction/counseling given, counseled patient on the dangers of tobacco use, advised patient to stop smoking, and reviewed strategies to maximize success, patient not ready to quit at this time.   Prediabetes Discussed general issues about diabetes pathophysiology and management., Educational material distributed., Suggested low cholesterol diet., Encouraged aerobic exercise., Discussed foot care., Reminded to get yearly retinal exam. - Hemoglobin A1c - Insulin, fasting  TMJ (dislocation of temporomandibular joint), subsequent encounter information given to the patient, no gum/decrease hard foods, warm wet wash clothes, decrease stress, talk with dentist about possible night guard, can do massage, and exercise.   Hypokalemia -     BASIC METABOLIC PANEL WITH GFR  Acute non-recurrent maxillary sinusitis -     doxycycline (VIBRAMYCIN) 100 MG capsule; Take 1 capsule twice daily with food  Abnormal  urinalysis -     Urinalysis, Routine w reflex microscopic -     Urine Culture -     fluconazole (DIFLUCAN) 150 MG tablet; Take 1 tablet (150 mg total) by mouth daily. -     triamcinolone cream (KENALOG) 0.5 %; Apply 1 application topically 2 (two) times daily.  Over 40 minutes of exam, counseling, chart review and critical decision making was performed  Future Appointments  Date Time Provider Department Center  01/26/2019  1:00 PM Cottle, Steva Readyarey G Jr., MD [facility] None  01/31/2019  3:00 PM Quentin Mullingollier, [name], PA-C [facility] None  04/22/2019  2:45 PM Jerene BearsMiller, [name], MD [facility] None     Plan:   During the course of the visit the patient was educated and counseled about appropriate screening and preventive services including:    Pneumococcal vaccine   Influenza vaccine  Td vaccine  Screening electrocardiogram  Bone densitometry screening  Colorectal cancer screening  Diabetes screening  Glaucoma screening  Nutrition counseling   Advanced directives: requested   Subjective:  Susan Bartlett is a 53 y.o. female who presents for Medicare Annual Wellness Visit.   Saw Dr. Ezzard StandingNewman Jan 21st for right ear pain, had sinus congestion x 3 weeks, getting worse. Has not had any antibiotic or prednisone, She is on nasocort x the 21st and nasal spray.    Her blood pressure has not been controlled at home, today their BP is BP: 124/88.  She does not workout. She denies chest pain, shortness of breath, dizziness.  She is on cholesterol medication, pravastatin 40 and denies myalgias. Her cholesterol is not at goal. The cholesterol last visit was:   Lab Results  Component Value Date   CHOL 213 (H) 11/24/2018   HDL 46 (L) 11/24/2018   LDLCALC 129 (H) 11/24/2018  TRIG 233 (H) 11/24/2018   CHOLHDL 4.6 11/24/2018   Last E4VA1C in the office was:  Lab Results  Component Value Date   HGBA1C 5.2 11/24/2018   Patient is on Vitamin D supplement.   Lab Results  Component Value Date   VD25OH 20.4 (L)  04/07/2018   She is on thyroid medication, she is on 75mcg. Her medication was not changed last visit.   Lab Results  Component Value Date   TSH 4.40 11/24/2018   BMI is Body mass index is 33.8 kg/m., she is working on diet and exercise. Wt Readings from Last 3 Encounters:  11/24/18 215 lb 12.8 oz (97.9 kg)  07/19/18 202 lb 6.4 oz (91.8 kg)  04/12/18 193 lb (87.5 kg)   Follows with Dr. Jennelle Bartlett and gets klonopin from them, it has changed manufatuer, having bilateral tinnitus, some dizziness, no decreased hearing.  Will go between diarrhea/contstipation.   Medication Review: Current Outpatient Medications on File Prior to Visit  Medication Sig Dispense Refill  . Cholecalciferol (VITAMIN D3) 5000 units CAPS Take by mouth daily.    . clonazePAM (KLONOPIN) 1 MG tablet Take 1/2 tablet by mouth three times a day,1 tablet at bedtime, and can have an additional 1/2 tablet every day as needed 90 tablet 1  . ibuprofen (ADVIL,MOTRIN) 200 MG tablet Take 200 mg by mouth every 6 (six) hours as needed.    Marland Kitchen. levothyroxine (SYNTHROID, LEVOTHROID) 75 MCG tablet TAKE 1 TABLET BY MOUTH ONCE DAILY 90 tablet 3  . pravastatin (PRAVACHOL) 40 MG tablet TAKE 1 TABLET BY MOUTH ONCE DAILY  90 tablet 0  . sertraline (ZOLOFT) 100 MG tablet TAKE 1 TABLET BY MOUTH TWICE DAILY 60 tablet 5   No current facility-administered medications on file prior to visit.     Current Problems (verified) Patient Active Problem List   Diagnosis Date Noted  . Abnormal glucose 11/24/2018  . Labile hypertension 03/05/2018  . Tobacco use disorder 01/16/2015  . TMJ (dislocation of temporomandibular joint) 01/16/2015  . Vitamin D deficiency   . Hepatomegaly   . Mixed hyperlipidemia   . Panic disorder   . Anal fissure   . Depression, major, in remission (HCC)   . Hypothyroidism   . Obesity     Screening Tests Immunization History  Administered Date(s) Administered  . Influenza Split 08/01/2013  . Td 11/18/2012    Preventative care: Tetanus: 11/2012 Pneumovax: N/A Prevnar 13: N/A Flu vaccines declines Zostavax: N/A  Pap:  08/2015, Dr. Hyacinth MeekerMiller MGM: 02/2018  DEXA: 2008, heel neg Colonoscopy: 11/2014 due 10 years EGD: N/A PFT: 01/2013 Stress test: 12/2012 [facility]([place]) Echo: 12/2012 CT head 2013  MRI brain 2011  Names of Other Physician/Practitioners you currently use: 1. [facility] here for primary care 2.  [facility], eye doctor, last visit 1999 3. Dr. Lequita AsalBen Turner , dentist, last visit 6 years Patient Care Team: Lucky CowboyMcKeown, William, MD as PCP - General (Internal Medicine) Wendall StadeNishan, Peter C, MD as Consulting Physician (Cardiology) Iva BoopGessner, Carl E, MD as Consulting Physician (Gastroenterology) Jerene BearsMiller, [name], MD as Consulting Physician (Gynecology) Cottle, Steva Readyarey G Jr., MD as Attending Physician (Psychiatry)  Allergies No Known Allergies  SURGICAL HISTORY She  has a past surgical history that includes Colonoscopy (03/25/2007); Hemorroidectomy; Cholecystectomy; Left oophorectomy; and Tonsillectomy. FAMILY HISTORY Her family history includes Alcohol abuse in her sister; Breast cancer in her cousin and mother; Cirrhosis in her father; Colon cancer (age of onset: 4250) in her maternal grandmother; Diabetes in  her mother; Drug abuse in her brother; Heart disease in her father. SOCIAL HISTORY She  reports that she has been smoking cigarettes. She has been smoking about 1.00 pack per day. She has never used smokeless tobacco. She reports that she does not drink alcohol or use drugs.  MEDICARE WELLNESS OBJECTIVES: Physical activity: Current Exercise Habits: The patient does not participate in regular exercise at present Cardiac risk factors: Cardiac Risk Factors include: advanced age (>59men, >37 women);dyslipidemia;hypertension;smoking/ tobacco exposure Depression/mood screen:   Depression screen [facility] 2/9 11/24/2018  Decreased Interest 1  Down, Depressed,  Hopeless 1  PHQ - 2 Score 2  Altered sleeping 0  Tired, decreased energy 2  Change in appetite 2  Feeling bad or failure about yourself  0  Trouble concentrating 1  Moving slowly or fidgety/restless 0  Suicidal thoughts 0  PHQ-9 Score 7  Difficult doing work/chores Very difficult    ADLs:  In your present state of health, do you have any difficulty performing the following activities: 11/24/2018 03/07/2018  Hearing? N N  Vision? N N  Difficulty concentrating or making decisions? N N  Walking or climbing stairs? N N  Dressing or bathing? N N  Doing errands, shopping? N N  Some recent data might be hidden     Cognitive Testing  Alert? Yes  Normal Appearance?Yes  Oriented to person? Yes  Place? Yes   Time? Yes  Recall of three objects?  Yes  Can perform simple calculations? Yes  Displays appropriate judgment?Yes  Can read the correct time from a watch face?Yes  EOL planning: Does Patient Have a Medical Advance Directive?: No Would patient like information on creating a medical advance directive?: Yes (MAU/Ambulatory/Procedural Areas - Information given)   Review of Systems  Constitutional: Positive for malaise/fatigue. Negative for chills and fever.  HENT: Negative for congestion, ear pain, hearing loss and sore throat.   Respiratory: Negative for cough, sputum production, shortness of breath and wheezing.   Cardiovascular: Negative for chest pain, palpitations and leg swelling.  Neurological: Positive for headaches.  Psychiatric/Behavioral: Positive for depression.    Objective:     Blood pressure 124/88, pulse 60, temperature 98.1 F (36.7 C), height 5\' 7"  (1.702 m), weight 215 lb 12.8 oz (97.9 kg), last menstrual period 08/04/2012, SpO2 99 %. Body mass index is 33.8 kg/m.  General appearance: alert, no distress, WD/WN, female HEENT: normocephalic, sclerae anicteric, TMs pearly, nares patent, no discharge or erythema, pharynx normal, + TMJ tenderness bilateral.   Oral cavity: MMM, no lesions Neck: supple, no lymphadenopathy, no thyromegaly, no masses Heart: RRR, normal S1, S2, no murmurs Lungs: CTA bilaterally, no wheezes, rhonchi, or rales Abdomen: +bs, soft, non tender, non distended, no masses, no hepatomegaly, no splenomegaly Musculoskeletal: nontender, no swelling, no obvious deformity Extremities: no edema, no cyanosis, no clubbing Pulses: 2+ symmetric, upper and lower extremities, normal cap refill Neurological: alert, oriented x 3, CN2-12 intact, strength normal upper extremities and lower extremities, sensation normal throughout, DTRs 2+ throughout, no cerebellar signs, normalgait Psychiatric: normal affect, behavior normal, pleasant   Medicare Attestation I have personally reviewed: The patient's medical and social history Their use of alcohol, tobacco or illicit drugs Their current medications and supplements The patient's functional ability including ADLs,fall risks, home safety risks, cognitive, and hearing and visual impairment Diet and physical activities Evidence for depression or mood disorders  The patient's weight, height, BMI, and visual acuity have been recorded in the chart.  I have made referrals, counseling, and provided education  to the patient based on review of the above and I have provided the patient with a written personalized care plan for preventive services.     Quentin Mulling, PA-C   11/25/2018

## 2018-11-24 NOTE — Patient Instructions (Addendum)
Check out eatright.org for a dietician We can also potential do counseling  Would like to try to add the wellbutrin for smoking cessation, depression and weight loss Ask your psych if you can cut back on the zoloft to 50 mg if possible, if not than make sure you do not get these symptoms below  Google cognitive behavioral therapy and check out on youtube Restart these things to help with your anxiety.   Okay to go to personal training  Please be aware that some of the medications that you are on can sometimes cause a rare and potentially dangerous adverse reaction, called SEROTONIN SYNDROME: Symptoms of this condition include (but are not limited to):  Agitation or restlessness, confusion, rapid heart rate and high blood pressure, dilated pupils, loss of muscle coordination or twitching muscles, muscle rigidity/stiffness, sweating and/or flushing, diarrhea, headache, shivering, goose bumps. If you have any of these symptoms you may have to stop the medication. Call your health care provider immediately.  Severe serotonin syndrome can be life-threatening emergency. Signs and symptoms of a severe reaction may include: high fever, seizures, irregular heartbeat, unconsciousness or altered level of awareness or personality changes.  If you have any of these new symptoms, call 911 or have someone take you to the emergency room.     VITAMIN D IS IMPORTANT  Vitamin D goal is between 60-80  Please make sure that you are taking your Vitamin D as directed.   It is very important as a natural anti-inflammatory   helping hair, skin, and nails, as well as reducing stroke and heart attack risk.   It helps your bones and helps with mood.  We want you on at least 5000 IU daily  It also decreases numerous cancer risks so please take it as directed.   Low Vit D is associated with a 200-300% higher risk for CANCER   and 200-300% higher risk for HEART   ATTACK  &  STROKE.     .....................................Marland Kitchen  It is also associated with higher death rate at younger ages,   autoimmune diseases like Rheumatoid arthritis, Lupus, Multiple Sclerosis.     Also many other serious conditions, like depression, Alzheimer's  Dementia, infertility, muscle aches, fatigue, fibromyalgia - just to name a few.  +++++++++++++++++++  Can get liquid vitamin D from [place]  OR here in [place] at  [facility] alternatives [address], [place], [place] [address] Or you can try earth fare  Can do a steroid nasal spary 1-2 sparys at night each nostril.  Remember to spray each nostril twice towards the outer part of your eye.   Do not sniff but instead pinch your nose and tilt your head back to help the medicine get into your sinuses.   The best time to do this is at bedtime.  Stop if you get blurred vision or nose bleeds.   THIS WILL TAKE 7 DAYS TO WORK AND IS BETTER IF YOU START BEFORE SYMPTOMS SO IF YOU HAVE A SEASON OR TIME OF THE YEAR YOU ALWAYS GET A COLD, START BEFORE THAT!   Your ears and sinuses are connected by the eustachian tube. When your sinuses are inflamed, this can close off the tube and cause fluid to collect in your middle ear. This can then cause dizziness, popping, clicking, ringing, and echoing in your ears. This is often NOT an infection and does NOT require antibiotics, it is caused by inflammation so the treatments help the inflammation. This can take a long time to  get better so please be patient.  Here are things you can do to help with this: - Try the Flonase or Nasonex. Remember to spray each nostril twice towards the outer part of your eye.  Do not sniff but instead pinch your nose and tilt your head back to help the medicine get into your sinuses.  The best time to do this is at bedtime.Stop if you get blurred vision or nose bleeds.  -While drinking fluids, pinch and hold nose close and swallow, to help open eustachian tubes to drain fluid behind ear  drums. -Please pick one of the over the counter allergy medications below and take it once daily for allergies.  It will also help with fluid behind ear drums. Claritin or loratadine cheapest but likely the weakest  Zyrtec or certizine at night because it can make you sleepy The strongest is allegra or fexafinadine  Cheapest at [facility], [employer], [employer] -can use decongestant over the counter, please do not use if you have high blood pressure or certain heart conditions.    if worsening HA, changes vision/speech, imbalance, weakness go to the ER

## 2018-11-25 ENCOUNTER — Encounter: Payer: Self-pay | Admitting: Physician Assistant

## 2018-11-25 ENCOUNTER — Telehealth: Payer: Self-pay | Admitting: *Deleted

## 2018-11-25 LAB — CBC WITH DIFFERENTIAL/PLATELET
Absolute Monocytes: 439 cells/uL (ref 200–950)
BASOS ABS: 69 {cells}/uL (ref 0–200)
BASOS PCT: 0.9 %
EOS ABS: 362 {cells}/uL (ref 15–500)
Eosinophils Relative: 4.7 %
HEMATOCRIT: 42.6 % (ref 35.0–45.0)
HEMOGLOBIN: 14.5 g/dL (ref 11.7–15.5)
LYMPHS ABS: 1555 {cells}/uL (ref 850–3900)
MCH: 30.9 pg (ref 27.0–33.0)
MCHC: 34 g/dL (ref 32.0–36.0)
MCV: 90.6 fL (ref 80.0–100.0)
MPV: 10.8 fL (ref 7.5–12.5)
Monocytes Relative: 5.7 %
NEUTROS ABS: 5275 {cells}/uL (ref 1500–7800)
Neutrophils Relative %: 68.5 %
Platelets: 337 10*3/uL (ref 140–400)
RBC: 4.7 10*6/uL (ref 3.80–5.10)
RDW: 13.1 % (ref 11.0–15.0)
Total Lymphocyte: 20.2 %
WBC: 7.7 10*3/uL (ref 3.8–10.8)

## 2018-11-25 LAB — URINE CULTURE
MICRO NUMBER: 187929
SPECIMEN QUALITY:: ADEQUATE

## 2018-11-25 LAB — COMPLETE METABOLIC PANEL WITH GFR
AG Ratio: 2 (calc) (ref 1.0–2.5)
ALBUMIN MSPROF: 4.8 g/dL (ref 3.6–5.1)
ALKALINE PHOSPHATASE (APISO): 87 U/L (ref 37–153)
ALT: 13 U/L (ref 6–29)
AST: 15 U/L (ref 10–35)
BILIRUBIN TOTAL: 0.4 mg/dL (ref 0.2–1.2)
BUN / CREAT RATIO: 7 (calc) (ref 6–22)
BUN: 6 mg/dL — ABNORMAL LOW (ref 7–25)
CHLORIDE: 100 mmol/L (ref 98–110)
CO2: 27 mmol/L (ref 20–32)
CREATININE: 0.81 mg/dL (ref 0.50–1.05)
Calcium: 10.1 mg/dL (ref 8.6–10.4)
GFR, Est African American: 95 mL/min/{1.73_m2} (ref >=60)
GFR, Est Non African American: 82 mL/min/{1.73_m2} (ref >=60)
GLOBULIN: 2.4 g/dL (ref 1.9–3.7)
Glucose, Bld: 83 mg/dL (ref 65–99)
POTASSIUM: 4.8 mmol/L (ref 3.5–5.3)
Sodium: 136 mmol/L (ref 135–146)
TOTAL PROTEIN: 7.2 g/dL (ref 6.1–8.1)

## 2018-11-25 LAB — URINALYSIS, ROUTINE W REFLEX MICROSCOPIC
BILIRUBIN URINE: NEGATIVE
Glucose, UA: NEGATIVE
HGB URINE DIPSTICK: NEGATIVE
Ketones, ur: NEGATIVE
Leukocytes,Ua: NEGATIVE
Nitrite: NEGATIVE
Protein, ur: NEGATIVE
Specific Gravity, Urine: 1.008 (ref 1.001–1.03)
pH: <=5 (ref 5.0–8.0)

## 2018-11-25 LAB — LIPID PANEL
Cholesterol: 213 mg/dL — ABNORMAL HIGH (ref <200)
HDL: 46 mg/dL — ABNORMAL LOW (ref >=50)
LDL Cholesterol (Calc): 129 mg/dL (calc) — ABNORMAL HIGH
Non-HDL Cholesterol (Calc): 167 mg/dL (calc) — ABNORMAL HIGH (ref <130)
Total CHOL/HDL Ratio: 4.6 (calc) (ref <5.0)
Triglycerides: 233 mg/dL — ABNORMAL HIGH (ref <150)

## 2018-11-25 LAB — MAGNESIUM: Magnesium: 1.9 mg/dL (ref 1.5–2.5)

## 2018-11-25 LAB — HEMOGLOBIN A1C
Hgb A1c MFr Bld: 5.2 % of total Hgb (ref <5.7)
Mean Plasma Glucose: 103 (calc)
eAG (mmol/L): 5.7 (calc)

## 2018-11-25 LAB — TSH: TSH: 4.4 mIU/L

## 2018-11-25 NOTE — Telephone Encounter (Signed)
Left message on voicemail to call and reschedule cancelled appointment. °

## 2018-12-28 ENCOUNTER — Other Ambulatory Visit: Payer: Self-pay

## 2018-12-28 MED ORDER — CLONAZEPAM 1 MG PO TABS: ORAL_TABLET | ORAL | 2 refills | Status: DC

## 2019-01-02 ENCOUNTER — Other Ambulatory Visit: Payer: Self-pay | Admitting: Internal Medicine

## 2019-01-06 DIAGNOSIS — J309 Allergic rhinitis, unspecified: Secondary | ICD-10-CM | POA: Diagnosis not present

## 2019-01-07 NOTE — Telephone Encounter (Signed)
THIS ENCOUNTER IS A VIRTUAL VISIT DUE TO COVID-19 - PATIENT WAS NOT SEEN IN THE OFFICE.  PATIENT HAS CONSENTED TO VIRTUAL VISIT / TELEMEDICINE VISIT   Virtual Visit via telephone Note  I connected with Susan Bartlett on 01/07/19 by telephone.  I verified that I am speaking with the correct person using two identifiers.    I discussed the limitations of evaluation and management by telemedicine and the availability of in person appointments. The patient expressed understanding and agreed to proceed.  History of Present Illness: She is taking care of elderly gentleman, 84.   She has no travel history and no possible exposure to COVID 19 patient.  Have been having symptoms x 1-2 weeks. Having pressure in her right ear, stopped up nose, she is on zyrtec x 2 days and it is helping. She has headache right side at night. She denies fever, chills, SOB, cough.     Medications  Current Outpatient Medications (Endocrine & Metabolic):  .  levothyroxine (SYNTHROID, LEVOTHROID) 75 MCG tablet, TAKE 1 TABLET BY MOUTH ONCE DAILY  Current Outpatient Medications (Cardiovascular):  .  pravastatin (PRAVACHOL) 40 MG tablet, Take 1 tablet at Bedtime for Cholesterol   Current Outpatient Medications (Analgesics):  .  ibuprofen (ADVIL,MOTRIN) 200 MG tablet, Take 200 mg by mouth every 6 (six) hours as needed.   Current Outpatient Medications (Other):  Marland Kitchen  Cholecalciferol (VITAMIN D3) 5000 units CAPS, Take by mouth daily. .  clonazePAM (KLONOPIN) 1 MG tablet, Take 1/2 tablet by mouth three times a day,1 tablet at bedtime, and can have an additional 1/2 tablet every day as needed .  doxycycline (VIBRAMYCIN) 100 MG capsule, Take 1 capsule twice daily with food .  fluconazole (DIFLUCAN) 150 MG tablet, Take 1 tablet (150 mg total) by mouth daily. .  sertraline (ZOLOFT) 100 MG tablet, TAKE 1 TABLET BY MOUTH TWICE DAILY .  triamcinolone cream (KENALOG) 0.5 %, Apply 1 application topically 2 (two) times  daily.  Problem list She has Hepatomegaly; Mixed hyperlipidemia; Panic disorder; Anal fissure; Depression, major, in remission (HCC); Hypothyroidism; Obesity; Vitamin D deficiency; Tobacco use disorder; TMJ (dislocation of temporomandibular joint); Labile hypertension; and Abnormal glucose on their problem list.   Observations/Objective: General Appearance:, in no apparent distress.  ENT/Mouth: No hoarseness, No cough for duration of visit.  Respiratory: completing full sentences without distress on the phone   Neuro: Awake and oriented X 3,  Psych:  Insight and Judgment appropriate.    Assessment and Plan: Saline spray can do more frequently, zyrtec at night, nasonex can try twice a day, can do decongestant, take tylenol as needed, If any fever, chills, teeth pain, gum she will call for ABX.   Follow Up Instructions:    I discussed the assessment and treatment plan with the patient. The patient was provided an opportunity to ask questions and all were answered. The patient agreed with the plan and demonstrated an understanding of the instructions.   The patient was advised to call back or seek an in-person evaluation if the symptoms worsen or if the condition fails to improve as anticipated.  I provided 30 minutes of non-face-to-face time during this encounter.   Quentin Mulling, PA-C

## 2019-01-13 ENCOUNTER — Other Ambulatory Visit: Payer: Self-pay | Admitting: Physician Assistant

## 2019-01-13 DIAGNOSIS — J0100 Acute maxillary sinusitis, unspecified: Secondary | ICD-10-CM

## 2019-01-13 MED ORDER — DOXYCYCLINE HYCLATE 100 MG PO CAPS: ORAL_CAPSULE | ORAL | 0 refills | Status: DC

## 2019-01-22 ENCOUNTER — Other Ambulatory Visit: Payer: Self-pay | Admitting: Physician Assistant

## 2019-01-22 MED ORDER — DEXAMETHASONE 0.5 MG PO TABS: ORAL_TABLET | ORAL | 0 refills | Status: DC

## 2019-01-26 ENCOUNTER — Ambulatory Visit: Payer: Medicare Other | Admitting: Psychiatry

## 2019-01-26 ENCOUNTER — Other Ambulatory Visit: Payer: Self-pay

## 2019-01-26 ENCOUNTER — Encounter: Payer: Self-pay | Admitting: Psychiatry

## 2019-01-26 DIAGNOSIS — F422 Mixed obsessional thoughts and acts: Secondary | ICD-10-CM | POA: Diagnosis not present

## 2019-01-26 DIAGNOSIS — G2581 Restless legs syndrome: Secondary | ICD-10-CM

## 2019-01-26 DIAGNOSIS — F411 Generalized anxiety disorder: Secondary | ICD-10-CM

## 2019-01-26 DIAGNOSIS — F4001 Agoraphobia with panic disorder: Secondary | ICD-10-CM

## 2019-01-26 MED ORDER — GABAPENTIN 100 MG PO CAPS: ORAL_CAPSULE | ORAL | 5 refills | Status: DC

## 2019-01-26 MED ORDER — SERTRALINE HCL 100 MG PO TABS: 250.0000 mg | ORAL_TABLET | Freq: Every day | ORAL | 1 refills | Status: DC

## 2019-01-26 NOTE — Progress Notes (Signed)
URI COVEY [id number] 01-Feb-1966 53 y.o.  Subjective:   Patient ID:  Susan Bartlett is a 53 y.o. (DOB 12/12/1965) female.  Chief Complaint:  Chief Complaint  Patient presents with  . Follow-up    Medication management  . Anxiety  . Medication Problem    Treatment resistant anxiety and panic disorder    HPI Susan Bartlett presents to the office today for follow-up of treatment resistant panic disorder and generalized anxiety disorder.  Rough DT Covid and fear of it.  A whole lot more anxiety and more panic.  Stopped watching news about 10 days ago.  Was watching it all the time. It is somewhat better.  Has bad sinuses and ears full of fluid.  Scares me then panics.  Watches her church services.  Also RLS has returned after a long time.  Hard to sleep.  Sometimes extra clonazepam.  Averaging 1/2 mg TID.  Consistent with sertraline.  Avoiding caffeine after lunch.  Previous medication trials include topiramate, gabapentin, risperidone, cyclobenzaprine, quetiapine, alprazolam, buspirone, paroxetine 50 mg, fluvoxamine, pindolol, sertraline 200, Lexapro among others for anxiety.  She is taking ropinirole for restless legs in the past.  Review of Systems:  Review of Systems  Neurological: Negative for tremors and weakness.  Psychiatric/Behavioral: Positive for sleep disturbance. Negative for agitation, behavioral problems, confusion, decreased concentration, dysphoric mood, hallucinations and suicidal ideas. The patient is nervous/anxious. The patient is not hyperactive.     Medications: I have reviewed the patient's current medications.  Current Outpatient Medications  Medication Sig Dispense Refill  . Cetirizine HCl (ZYRTEC ALLERGY) 10 MG CAPS Take by mouth.    . Cholecalciferol (VITAMIN D3) 5000 units CAPS Take by mouth daily.    . clonazePAM (KLONOPIN) 1 MG tablet Take 1/2 tablet by mouth three times a day,1 tablet at bedtime, and can have an additional 1/2 tablet every day as needed  90 tablet 2  . Fexofenadine HCl (MUCINEX ALLERGY PO) Take by mouth.    Marland Kitchen ibuprofen (ADVIL,MOTRIN) 200 MG tablet Take 200 mg by mouth every 6 (six) hours as needed.    Marland Kitchen levothyroxine (SYNTHROID, LEVOTHROID) 75 MCG tablet TAKE 1 TABLET BY MOUTH ONCE DAILY 90 tablet 3  . mometasone (NASONEX) 50 MCG/ACT nasal spray Place 2 sprays into the nose daily.    . pravastatin (PRAVACHOL) 40 MG tablet Take 1 tablet at Bedtime for Cholesterol 90 tablet 1  . Pseudoephed-APAP-Guaifenesin (TYLENOL SINUS SEVERE CONGEST PO) Take by mouth.    . sertraline (ZOLOFT) 100 MG tablet TAKE 1 TABLET BY MOUTH TWICE DAILY 60 tablet 5  . triamcinolone cream (KENALOG) 0.5 % Apply 1 application topically 2 (two) times daily. (Patient not taking: Reported on 01/26/2019) 80 g 2   No current facility-administered medications for this visit.     Medication Side Effects: None  Allergies: No Known Allergies  Past Medical History:  Diagnosis Date  . Anal fissure 1994  . Anxiety   . Arthritis    Dr. Penni Bombard at [facility]  . Depression   . Endometriosis   . Fracture 08/2011   fracture left wrist and elbow  . Hemorrhoids 2008  . Hepatomegaly   . Hypothyroidism   . Mixed hyperlipidemia   . Obesity   . OCD (obsessive compulsive disorder)   . Panic disorder   . Plantar fasciitis, right 03/2014  . Prediabetes 01/16/2015  . RLS (restless legs syndrome) 2014   vis sleep study- no sleep apnea  . Shingles 07/2017  . Tobacco  abuse    not tolerated Chantix in the past  . Vitamin D deficiency     Family History  Problem Relation Age of Onset  . Breast cancer Mother   . Diabetes Mother   . Drug abuse Brother        and alcohol  . Cirrhosis Father        alcohol  . Heart disease Father        MI  . Colon cancer Maternal Grandmother 50  . Alcohol abuse Sister   . Breast cancer Cousin   . Thyroid disease Neg Hx     Social History   Socioeconomic History  . Marital status: Single    Spouse name: Not on  file  . Number of children: 1  . Years of education: Not on file  . Highest education level: Not on file  Occupational History  . Occupation: Agricultural consultantdisabilty    Employer: [employer]  Social Needs  . Financial resource strain: Not on file  . Food insecurity:    Worry: Not on file    Inability: Not on file  . Transportation needs:    Medical: Not on file    Non-medical: Not on file  Tobacco Use  . Smoking status: Current Every Day Smoker    Packs/day: 1.00    Types: Cigarettes  . Smokeless tobacco: Never Used  Substance and Sexual Activity  . Alcohol use: No    Alcohol/week: 0.0 standard drinks  . Drug use: No  . Sexual activity: Not Currently    Birth control/protection: Post-menopausal  Lifestyle  . Physical activity:    Days per week: Not on file    Minutes per session: Not on file  . Stress: Not on file  Relationships  . Social connections:    Talks on phone: Not on file    Gets together: Not on file    Attends religious service: Not on file    Active member of club or organization: Not on file    Attends meetings of clubs or organizations: Not on file    Relationship status: Not on file  . Intimate partner violence:    Fear of current or ex partner: Not on file    Emotionally abused: Not on file    Physically abused: Not on file    Forced sexual activity: Not on file  Other Topics Concern  . Not on file  Social History Narrative  . Not on file    Past Medical History, Surgical history, Social history, and Family history were reviewed and updated as appropriate.   Please see review of systems for further details on the patient's review from today.   Objective:   Physical Exam:  LMP 08/04/2012   Physical Exam Neurological:     Mental Status: She is alert and oriented to person, place, and time.     Cranial Nerves: No dysarthria.  Psychiatric:        Attention and Perception: Attention normal.        Mood and Affect: Mood is anxious. Mood is not depressed.         Speech: Speech normal.        Behavior: Behavior is cooperative.        Thought Content: Thought content is not paranoid or delusional. Thought content does not include homicidal or suicidal ideation. Thought content does not include homicidal or suicidal plan.        Cognition and Memory: Cognition and memory normal.  Judgment: Judgment normal.     Comments: Increased obsessiveness over fear of Covid and more anxiety.   A lot of questions and reassurance seeking. Lab Review:     Component Value Date/Time   NA 136 11/24/2018 1701   K 4.8 11/24/2018 1701   CL 100 11/24/2018 1701   CO2 27 11/24/2018 1701   GLUCOSE 83 11/24/2018 1701   BUN 6 (L) 11/24/2018 1701   CREATININE 0.81 11/24/2018 1701   CALCIUM 10.1 11/24/2018 1701   PROT 7.2 11/24/2018 1701   ALBUMIN 4.3 11/04/2016 1402   AST 15 11/24/2018 1701   ALT 13 11/24/2018 1701   ALKPHOS 81 11/04/2016 1402   BILITOT 0.4 11/24/2018 1701   GFRNONAA 82 11/24/2018 1701   GFRAA 95 11/24/2018 1701       Component Value Date/Time   WBC 7.7 11/24/2018 1701   RBC 4.70 11/24/2018 1701   HGB 14.5 11/24/2018 1701   HGB 14.1 02/03/2013 1523   HCT 42.6 11/24/2018 1701   PLT 337 11/24/2018 1701   MCV 90.6 11/24/2018 1701   MCH 30.9 11/24/2018 1701   MCHC 34.0 11/24/2018 1701   RDW 13.1 11/24/2018 1701   LYMPHSABS 1,555 11/24/2018 1701   MONOABS 426 09/16/2016 1249   EOSABS 362 11/24/2018 1701   BASOSABS 69 11/24/2018 1701    No results found for: POCLITH, LITHIUM   No results found for: PHENYTOIN, PHENOBARB, VALPROATE, CBMZ   .res Assessment: Plan:    Panic disorder with agoraphobia  Mixed obsessional thoughts and acts  Generalized anxiety disorder  Restless leg syndrome, uncontrolled   Greater than 50% of face to face time with patient was spent on counseling and coordination of care. We discussed her phobia of medications and other psych dxes.  I believe the difference she notices between the Teva generic  and other generics of clonazepam is a real and legitimate physiological effect as I have heard other patients report the same.  However we have not been able to control her anxiety despite the use of an SSRI plus benzodiazepine.  Her anxiety remains treatment resistant she is better with these medicines then without them.  She is fearful of medication changes generally.  She has had psychotherapy with limited additional progress.  She has also been med sensitive.  Chronic somatic fears worse with Covid.  Reassurance and CBT for chronic anxiety and obsessiveness and fears of anxiety.  She remains disabled  She uses self-care and relaxation techniques.   In short-term increase clonazepam for anxiety.  For longer term, exceed the usual dose of sertraline for TR anxieety to 250 mg daily.  Disc SE and she agrees.  Disc anxiety and insomnia risks with pseudofed.  Watch caffeine, avoid after noon.  FOR RLS,  Gabapentin prn 100-300 mg PM  This appt was 30 mins.  FU 2 mos  I connected with patient by a video enabled telemedicine application or telephone, with their informed consent, and verified patient privacy and that I am speaking with the correct person using two identifiers.  I was located at office and patient at home.  Meredith Staggers, MD, DFAPA  Please see After Visit Summary for patient specific instructions.  Future Appointments  Date Time Provider Department Center  01/31/2019 12:30 PM Quentin Mulling, PA-C [facility] None  02/01/2020  3:00 PM Quentin Mulling, PA-C [facility] None    No orders of the defined types were placed in this encounter.     -------------------------------

## 2019-01-31 ENCOUNTER — Encounter: Payer: Self-pay | Admitting: Physician Assistant

## 2019-03-29 ENCOUNTER — Encounter: Payer: Self-pay | Admitting: Psychiatry

## 2019-03-29 ENCOUNTER — Ambulatory Visit (INDEPENDENT_AMBULATORY_CARE_PROVIDER_SITE_OTHER): Payer: Medicare Other | Admitting: Psychiatry

## 2019-03-29 ENCOUNTER — Other Ambulatory Visit: Payer: Self-pay

## 2019-03-29 DIAGNOSIS — F422 Mixed obsessional thoughts and acts: Secondary | ICD-10-CM | POA: Diagnosis not present

## 2019-03-29 DIAGNOSIS — F411 Generalized anxiety disorder: Secondary | ICD-10-CM | POA: Diagnosis not present

## 2019-03-29 DIAGNOSIS — G2581 Restless legs syndrome: Secondary | ICD-10-CM

## 2019-03-29 DIAGNOSIS — F4001 Agoraphobia with panic disorder: Secondary | ICD-10-CM

## 2019-03-29 DIAGNOSIS — F5105 Insomnia due to other mental disorder: Secondary | ICD-10-CM

## 2019-03-29 NOTE — Progress Notes (Signed)
Susan Bartlett [id number] 04-24-1966 53 y.o.  Virtual Visit via Telephone Note  I connected with pt by telephone and verified that I am speaking with the correct person using two identifiers.   I discussed the limitations, risks, security and privacy concerns of performing an evaluation and management service by telephone and the availability of in person appointments. I also discussed with the patient that there may be a patient responsible charge related to this service. The patient expressed understanding and agreed to proceed.  I discussed the assessment and treatment plan with the patient. The patient was provided an opportunity to ask questions and all were answered. The patient agreed with the plan and demonstrated an understanding of the instructions.   The patient was advised to call back or seek an in-person evaluation if the symptoms worsen or if the condition fails to improve as anticipated.  I provided 30 minutes of non-face-to-face time during this encounter. The call started at 130 and ended at 2:00. The patient was located at home and the provider was located office.  Subjective:   Patient ID:  Susan KosKaren L Stowers is a 53 y.o. (DOB 04-24-1966) female.  Chief Complaint:  Chief Complaint  Patient presents with  . Follow-up    Medication Management  . Anxiety    Medication Management  . Depression    Medication Management    Anxiety Symptoms include nervous/anxious behavior. Patient reports no confusion, decreased concentration or suicidal ideas.    Depression        Associated symptoms include no decreased concentration and no suicidal ideas.  Past medical history includes anxiety.    Susan KosKaren L Roscoe presents to the office today for follow-up of treatment resistant panic disorder and generalized anxiety disorder.  Last visit was January 26, 2019.  The following changes were recommended. In short-term increase clonazepam for anxiety. For longer term, exceed the usual dose of  sertraline for TR anxieety to 250 mg daily.  Disc SE and she agrees. Watch caffeine, avoid after noon. FOR RLS,  Gabapentin prn 100-300 mg PM  Forgot to increase sertraline.  Did increase the Klonopin for panic.  Now taking 1/2 of 1mg   TID and 1 at HS.  Panic varies from 0 to 3 daily.  Not watching news nor social media. Allerigies and off balance feelings will trigger panic.  All physical sx trigger her to panic.  Using an App called [employer] with meditation.  Felt panicky talking to asst today about her meds.  Not leaving the house.  Scared to drive DT protests and Covid.  No SE with meds.    Rough DT Covid and fear of it.  A whole lot more anxiety and more panic.  Stopped watching news about 10 days ago.  Was watching it all the time. It is somewhat better.  Has bad sinuses and ears full of fluid.  Scares me then panics.  Watches her church services.  Gabapentin helps restless legs and anxiety but feels groggy a bit the next day. She can tell an affect when she takes it.  Previous medication trials include topiramate, gabapentin, risperidone, cyclobenzaprine, quetiapine, alprazolam, buspirone, paroxetine 50 mg, fluvoxamine, pindolol, sertraline 200, Lexapro among others for anxiety.  She is taking ropinirole for restless legs in the past.  Review of Systems:  Review of Systems  Neurological: Negative for tremors and weakness.  Psychiatric/Behavioral: Positive for depression and sleep disturbance. Negative for agitation, behavioral problems, confusion, decreased concentration, dysphoric mood, hallucinations and suicidal ideas. The  patient is nervous/anxious. The patient is not hyperactive.     Medications: I have reviewed the patient's current medications.  Current Outpatient Medications  Medication Sig Dispense Refill  . Cetirizine HCl (ZYRTEC ALLERGY) 10 MG CAPS Take by mouth.    . Cholecalciferol (VITAMIN D3) 5000 units CAPS Take by mouth daily.    . clonazePAM (KLONOPIN) 1 MG tablet  Take 1/2 tablet by mouth three times a day,1 tablet at bedtime, and can have an additional 1/2 tablet every day as needed 90 tablet 2  . gabapentin (NEURONTIN) 100 MG capsule 1-3 capsules as needed for restless legs 90 capsule 5  . ibuprofen (ADVIL,MOTRIN) 200 MG tablet Take 200 mg by mouth every 6 (six) hours as needed.    Marland Kitchen levothyroxine (SYNTHROID, LEVOTHROID) 75 MCG tablet TAKE 1 TABLET BY MOUTH ONCE DAILY 90 tablet 3  . mometasone (NASONEX) 50 MCG/ACT nasal spray Place 2 sprays into the nose daily.    . pravastatin (PRAVACHOL) 40 MG tablet Take 1 tablet at Bedtime for Cholesterol 90 tablet 1  . Pseudoephedrine-guaiFENesin (MUCINEX D PO) Take by mouth.    . sertraline (ZOLOFT) 100 MG tablet Take 2.5 tablets (250 mg total) by mouth daily. (Patient taking differently: Take 200 mg by mouth daily. ) 225 tablet 1  . sodium chloride (OCEAN) 0.65 % SOLN nasal spray Place 1 spray into both nostrils as needed for congestion.     No current facility-administered medications for this visit.     Medication Side Effects: None  Allergies: No Known Allergies  Past Medical History:  Diagnosis Date  . Anal fissure 1994  . Anxiety   . Arthritis    Dr. Delilah Shan at [facility]  . Depression   . Endometriosis   . Fracture 08/2011   fracture left wrist and elbow  . Hemorrhoids 2008  . Hepatomegaly   . Hypothyroidism   . Mixed hyperlipidemia   . Obesity   . OCD (obsessive compulsive disorder)   . Panic disorder   . Plantar fasciitis, right 03/2014  . Prediabetes 01/16/2015  . RLS (restless legs syndrome) 2014   vis sleep study- no sleep apnea  . Shingles 07/2017  . Tobacco abuse    not tolerated Chantix in the past  . Vitamin D deficiency     Family History  Problem Relation Age of Onset  . Breast cancer Mother   . Diabetes Mother   . Drug abuse Brother        and alcohol  . Cirrhosis Father        alcohol  . Heart disease Father        MI  . Colon cancer Maternal  Grandmother 37  . Alcohol abuse Sister   . Breast cancer Cousin   . Thyroid disease Neg Hx     Social History   Socioeconomic History  . Marital status: Single    Spouse name: Not on file  . Number of children: 1  . Years of education: Not on file  . Highest education level: Not on file  Occupational History  . Occupation: Occupational psychologist: [employer]  . Financial resource strain: Not on file  . Food insecurity    Worry: Not on file    Inability: Not on file  . Transportation needs    Medical: Not on file    Non-medical: Not on file  Tobacco Use  . Smoking status: Current Every Day Smoker    Packs/day: 1.00  Types: Cigarettes  . Smokeless tobacco: Never Used  Substance and Sexual Activity  . Alcohol use: No    Alcohol/week: 0.0 standard drinks  . Drug use: No  . Sexual activity: Not Currently    Birth control/protection: Post-menopausal  Lifestyle  . Physical activity    Days per week: Not on file    Minutes per session: Not on file  . Stress: Not on file  Relationships  . Social Musicianconnections    Talks on phone: Not on file    Gets together: Not on file    Attends religious service: Not on file    Active member of club or organization: Not on file    Attends meetings of clubs or organizations: Not on file    Relationship status: Not on file  . Intimate partner violence    Fear of current or ex partner: Not on file    Emotionally abused: Not on file    Physically abused: Not on file    Forced sexual activity: Not on file  Other Topics Concern  . Not on file  Social History Narrative  . Not on file    Past Medical History, Surgical history, Social history, and Family history were reviewed and updated as appropriate.   Please see review of systems for further details on the patient's review from today.   Objective:   Physical Exam:  LMP 08/04/2012   Physical Exam Neurological:     Mental Status: She is alert and oriented to person,  place, and time.     Cranial Nerves: No dysarthria.  Psychiatric:        Attention and Perception: Attention normal.        Mood and Affect: Mood is anxious. Mood is not depressed.        Speech: Speech normal.        Behavior: Behavior is cooperative.        Thought Content: Thought content is not paranoid or delusional. Thought content does not include homicidal or suicidal ideation. Thought content does not include homicidal or suicidal plan.        Cognition and Memory: Cognition and memory normal.        Judgment: Judgment normal.     Comments: Increased obsessiveness over fear of Covid and more anxiety.   A lot of questions and reassurance seeking. Lab Review:     Component Value Date/Time   NA 136 11/24/2018 1701   K 4.8 11/24/2018 1701   CL 100 11/24/2018 1701   CO2 27 11/24/2018 1701   GLUCOSE 83 11/24/2018 1701   BUN 6 (L) 11/24/2018 1701   CREATININE 0.81 11/24/2018 1701   CALCIUM 10.1 11/24/2018 1701   PROT 7.2 11/24/2018 1701   ALBUMIN 4.3 11/04/2016 1402   AST 15 11/24/2018 1701   ALT 13 11/24/2018 1701   ALKPHOS 81 11/04/2016 1402   BILITOT 0.4 11/24/2018 1701   GFRNONAA 82 11/24/2018 1701   GFRAA 95 11/24/2018 1701       Component Value Date/Time   WBC 7.7 11/24/2018 1701   RBC 4.70 11/24/2018 1701   HGB 14.5 11/24/2018 1701   HGB 14.1 02/03/2013 1523   HCT 42.6 11/24/2018 1701   PLT 337 11/24/2018 1701   MCV 90.6 11/24/2018 1701   MCH 30.9 11/24/2018 1701   MCHC 34.0 11/24/2018 1701   RDW 13.1 11/24/2018 1701   LYMPHSABS 1,555 11/24/2018 1701   MONOABS 426 09/16/2016 1249   EOSABS 362 11/24/2018 1701   BASOSABS  69 11/24/2018 1701    No results found for: POCLITH, LITHIUM   No results found for: PHENYTOIN, PHENOBARB, VALPROATE, CBMZ   .res Assessment: Plan:    Clydie BraunKaren was seen today for follow-up, anxiety and depression.  Diagnoses and all orders for this visit:  Panic disorder with agoraphobia  Mixed obsessional thoughts and  acts  Generalized anxiety disorder  Restless leg syndrome, uncontrolled  Insomnia due to mental condition   Greater than 50% of face to face time with patient was spent on counseling and coordination of care. We discussed her phobia of medications and other psych dxes.  I believe the difference she notices between the Teva generic and other generics of clonazepam is a real and legitimate physiological effect as I have heard other patients report the same.  However we have not been able to control her anxiety despite the use of an SSRI plus benzodiazepine.  Her anxiety remains treatment resistant she is better with these medicines then without them.  She is fearful of medication changes generally.  She has had psychotherapy with limited additional progress.  She has also been med sensitive.  Often afraid of med changes.  Chronic somatic fears worse with Covid.  Reassurance and CBT for chronic anxiety and obsessiveness and fears of anxiety.  She remains disabled  She uses self-care and relaxation techniques.   Continue current clonazepam for anxiety. We discussed the short-term risks associated with benzodiazepines including sedation and increased fall risk among others.  Discussed long-term side effect risk including dependence, potential withdrawal symptoms, and the potential eventual dose-related risk of dementia.  For longer term, exceed the usual dose of sertraline for TR anxieety to 250 mg daily.  Disc SE and she agrees though she didn't do it last time.    Disc anxiety and insomnia risks with pseudofed.  Watch caffeine, avoid after noon.  FOR RLS,  Gabapentin prn 100-300 mg PM This has been affective.  Consider increase trial again for anxiety.  This appt was 30 mins.  FU 4 mos  Meredith Staggersarey Cottle, MD, DFAPA  Please see After Visit Summary for patient specific instructions.  Future Appointments  Date Time Provider Department Center  04/04/2019  3:00 PM Quentin Mullingollier, Amanda, PA-C  [facility] None    No orders of the defined types were placed in this encounter.     -------------------------------

## 2019-03-30 NOTE — Progress Notes (Deleted)
MEDICARE ANNUAL WELLNESS VISIT   Assessment:     Hypothyroidism, unspecified hypothyroidism type Hypothyroidism-check TSH level, continue medications the same, reminded to take on an empty stomach 30-3460mins before food.  - TSH   Hepatomegaly Weight loss advised, no alcohol - Hepatic function panel  Mixed hyperlipidemia -continue medications, check lipids, decrease fatty foods, increase activity. 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 Able to add wellbutrin for weight loss/smoking will send message   Obesity Obesity with co morbidities- long discussion about weight loss, diet, and exercise Will contact Dr. Jennelle Humanottle, want to add Wellbutrin  Panic disorder Follow up psych, continue meds  Vitamin D deficiency - Magnesium - Vit D  25 hydroxy (rtn osteoporosis monitoring)  Anal fissure Controlled, increase water/fiber  Tobacco use disorder Smoking cessation-  instruction/counseling given, counseled patient on the dangers of tobacco use, advised patient to stop smoking, and reviewed strategies to maximize success, patient not ready to quit at this time.   Prediabetes Discussed general issues about diabetes pathophysiology and management., Educational material distributed., Suggested low cholesterol diet., Encouraged aerobic exercise., Discussed foot care., Reminded to get yearly retinal exam. - Hemoglobin A1c - Insulin, fasting  TMJ (dislocation of temporomandibular joint), subsequent encounter information given to the patient, no gum/decrease hard foods, warm wet wash clothes, decrease stress, talk with dentist about possible night guard, can do massage, and exercise.   Hypokalemia -     BASIC METABOLIC PANEL WITH GFR  Acute non-recurrent maxillary sinusitis -     doxycycline (VIBRAMYCIN) 100 MG capsule; Take 1 capsule twice daily with food  Abnormal  urinalysis -     Urinalysis, Routine w reflex microscopic -     Urine Culture -     fluconazole (DIFLUCAN) 150 MG tablet; Take 1 tablet (150 mg total) by mouth daily. -     triamcinolone cream (KENALOG) 0.5 %; Apply 1 application topically 2 (two) times daily.  Over 40 minutes of exam, counseling, chart review and critical decision making was performed  Future Appointments  Date Time Provider Department Center  04/04/2019  3:00 PM Quentin MullingCollier, Arad Burston, PA-C [facility] None  04/09/2020  3:00 PM Quentin Mullingollier, Makhari Dovidio, PA-C [facility] None     Plan:   During the course of the visit the patient was educated and counseled about appropriate screening and preventive services including:    Pneumococcal vaccine   Influenza vaccine  Td vaccine  Screening electrocardiogram  Bone densitometry screening  Colorectal cancer screening  Diabetes screening  Glaucoma screening  Nutrition counseling   Advanced directives: requested   Subjective:  Susan Bartlett is a 53 y.o. female who presents for Medicare Annual Wellness Visit.   Saw Dr. Ezzard StandingNewman Jan 21st for right ear pain, had sinus congestion x 3 weeks, getting worse. Has not had any antibiotic or prednisone, She is on nasocort x the 21st and nasal spray.    Her blood pressure has not been controlled at home, today their BP is  .  She does not workout. She denies chest pain, shortness of breath, dizziness.  She is on cholesterol medication, pravastatin 40 and denies myalgias. Her cholesterol is not at goal. The cholesterol last visit was:   Lab Results  Component Value Date   CHOL 213 (H) 11/24/2018   HDL 46 (L) 11/24/2018   LDLCALC 129 (H) 11/24/2018   TRIG 233 (H) 11/24/2018   CHOLHDL 4.6 11/24/2018  Last A1C in the office was:  Lab Results  Component Value Date   HGBA1C 5.2 11/24/2018   Patient is on Vitamin D supplement.   Lab Results  Component Value Date   VD25OH 20.4 (L) 04/07/2018   She is on thyroid medication, she is on  75mcg. Her medication was not changed last visit.   Lab Results  Component Value Date   TSH 4.40 11/24/2018   BMI is There is no height or weight on file to calculate BMI., she is working on diet and exercise. Wt Readings from Last 3 Encounters:  11/24/18 215 lb 12.8 oz (97.9 kg)  07/19/18 202 lb 6.4 oz (91.8 kg)  04/12/18 193 lb (87.5 kg)   Follows with Dr. Jennelle Humanottle and gets klonopin from them, it has changed manufatuer, having bilateral tinnitus, some dizziness, no decreased hearing.  Will go between diarrhea/contstipation.   Medication Review: Current Outpatient Medications on File Prior to Visit  Medication Sig Dispense Refill  . Cholecalciferol (VITAMIN D3) 5000 units CAPS Take by mouth daily.    . clonazePAM (KLONOPIN) 1 MG tablet Take 1/2 tablet by mouth three times a day,1 tablet at bedtime, and can have an additional 1/2 tablet every day as needed 90 tablet 1  . ibuprofen (ADVIL,MOTRIN) 200 MG tablet Take 200 mg by mouth every 6 (six) hours as needed.    Marland Kitchen. levothyroxine (SYNTHROID, LEVOTHROID) 75 MCG tablet TAKE 1 TABLET BY MOUTH ONCE DAILY 90 tablet 3  . pravastatin (PRAVACHOL) 40 MG tablet TAKE 1 TABLET BY MOUTH ONCE DAILY  90 tablet 0  . sertraline (ZOLOFT) 100 MG tablet TAKE 1 TABLET BY MOUTH TWICE DAILY 60 tablet 5   No current facility-administered medications on file prior to visit.     Current Problems (verified) Patient Active Problem List   Diagnosis Date Noted  . Abnormal glucose 11/24/2018  . Labile hypertension 03/05/2018  . Tobacco use disorder 01/16/2015  . TMJ (dislocation of temporomandibular joint) 01/16/2015  . Vitamin D deficiency   . Hepatomegaly   . Mixed hyperlipidemia   . Panic disorder   . Anal fissure   . Depression, major, in remission (HCC)   . Hypothyroidism   . Obesity     Screening Tests Immunization History  Administered Date(s) Administered  . Influenza Split 08/01/2013  . Td 11/18/2012   Preventative care: Tetanus:  11/2012 Pneumovax: N/A Prevnar 13: N/A Flu vaccines declines Zostavax: N/A  Pap:  08/2015, Dr. Hyacinth MeekerMiller MGM: 02/2018  DEXA: 2008, heel neg Colonoscopy: 11/2014 due 10 years EGD: N/A PFT: 01/2013 Stress test: 12/2012 [facility]([place]) Echo: 12/2012 CT head 2013  MRI brain 2011  Names of Other Physician/Practitioners you currently use: 1. [facility] here for primary care 2.  [facility], eye doctor, last visit 1999 3. Dr. Lequita AsalBen Turner , dentist, last visit 6 years Patient Care Team: Lucky CowboyMcKeown, William, MD as PCP - General (Internal Medicine) Wendall StadeNishan, Peter C, MD as Consulting Physician (Cardiology) Iva BoopGessner, Carl E, MD as Consulting Physician (Gastroenterology) Jerene BearsMiller, [name], MD as Consulting Physician (Gynecology) Cottle, Steva Readyarey G Jr., MD as Attending Physician (Psychiatry)  Allergies No Known Allergies  SURGICAL HISTORY She  has a past surgical history that includes Colonoscopy (03/25/2007); Hemorroidectomy; Cholecystectomy; Left oophorectomy; and Tonsillectomy. FAMILY HISTORY Her family history includes Alcohol abuse in her sister; Breast cancer in her cousin and mother; Cirrhosis in her father; Colon cancer (age of onset: 8850) in her maternal grandmother; Diabetes in her mother; Drug abuse in her  brother; Heart disease in her father. SOCIAL HISTORY She  reports that she has been smoking cigarettes. She has been smoking about 1.00 pack per day. She has never used smokeless tobacco. She reports that she does not drink alcohol or use drugs.  MEDICARE WELLNESS OBJECTIVES: Physical activity:   Cardiac risk factors:   Depression/mood screen:   Depression screen [facility] 2/9 11/24/2018  Decreased Interest 1  Down, Depressed, Hopeless 1  PHQ - 2 Score 2  Altered sleeping 0  Tired, decreased energy 2  Change in appetite 2  Feeling bad or failure about yourself  0  Trouble concentrating 1  Moving slowly or fidgety/restless 0  Suicidal thoughts 0   PHQ-9 Score 7  Difficult doing work/chores Very difficult    ADLs:  In your present state of health, do you have any difficulty performing the following activities: 11/24/2018  Hearing? N  Vision? N  Difficulty concentrating or making decisions? N  Walking or climbing stairs? N  Dressing or bathing? N  Doing errands, shopping? N  Some recent data might be hidden     Cognitive Testing  Alert? Yes  Normal Appearance?Yes  Oriented to person? Yes  Place? Yes   Time? Yes  Recall of three objects?  Yes  Can perform simple calculations? Yes  Displays appropriate judgment?Yes  Can read the correct time from a watch face?Yes  EOL planning:     Review of Systems  Constitutional: Positive for malaise/fatigue. Negative for chills and fever.  HENT: Negative for congestion, ear pain, hearing loss and sore throat.   Respiratory: Negative for cough, sputum production, shortness of breath and wheezing.   Cardiovascular: Negative for chest pain, palpitations and leg swelling.  Neurological: Positive for headaches.  Psychiatric/Behavioral: Positive for depression.    Objective:     Last menstrual period 08/04/2012. There is no height or weight on file to calculate BMI.  General appearance: alert, no distress, WD/WN, female HEENT: normocephalic, sclerae anicteric, TMs pearly, nares patent, no discharge or erythema, pharynx normal, + TMJ tenderness bilateral.  Oral cavity: MMM, no lesions Neck: supple, no lymphadenopathy, no thyromegaly, no masses Heart: RRR, normal S1, S2, no murmurs Lungs: CTA bilaterally, no wheezes, rhonchi, or rales Abdomen: +bs, soft, non tender, non distended, no masses, no hepatomegaly, no splenomegaly Musculoskeletal: nontender, no swelling, no obvious deformity Extremities: no edema, no cyanosis, no clubbing Pulses: 2+ symmetric, upper and lower extremities, normal cap refill Neurological: alert, oriented x 3, CN2-12 intact, strength normal upper extremities  and lower extremities, sensation normal throughout, DTRs 2+ throughout, no cerebellar signs, normalgait Psychiatric: normal affect, behavior normal, pleasant   Medicare Attestation I have personally reviewed: The patient's medical and social history Their use of alcohol, tobacco or illicit drugs Their current medications and supplements The patient's functional ability including ADLs,fall risks, home safety risks, cognitive, and hearing and visual impairment Diet and physical activities Evidence for depression or mood disorders  The patient's weight, height, BMI, and visual acuity have been recorded in the chart.  I have made referrals, counseling, and provided education to the patient based on review of the above and I have provided the patient with a written personalized care plan for preventive services.     Vicie Mutters, PA-C   03/30/2019

## 2019-04-04 ENCOUNTER — Encounter: Payer: Self-pay | Admitting: Physician Assistant

## 2019-04-12 ENCOUNTER — Other Ambulatory Visit: Payer: Self-pay | Admitting: Obstetrics & Gynecology

## 2019-04-12 NOTE — Telephone Encounter (Signed)
Medication refill request: Levothyroxine 75 mcg  Last AEX:  01/27/18 Next AEX: nothing scheduled. Called patient left message advising patient to schedule AEX  Last MMG (if hormonal medication request): 02/15/18 Bi-rads 1 neg  Refill authorized: #90 with 0 RF

## 2019-04-22 ENCOUNTER — Ambulatory Visit: Payer: Medicare Other | Admitting: Obstetrics & Gynecology

## 2019-04-22 ENCOUNTER — Encounter

## 2019-04-29 ENCOUNTER — Other Ambulatory Visit: Payer: Self-pay

## 2019-04-29 DIAGNOSIS — Z20822 Contact with and (suspected) exposure to covid-19: Secondary | ICD-10-CM

## 2019-05-04 LAB — NOVEL CORONAVIRUS, NAA: SARS-CoV-2, NAA: NOT DETECTED

## 2019-05-24 ENCOUNTER — Other Ambulatory Visit: Payer: Self-pay | Admitting: Obstetrics & Gynecology

## 2019-05-24 DIAGNOSIS — Z1231 Encounter for screening mammogram for malignant neoplasm of breast: Secondary | ICD-10-CM

## 2019-06-28 ENCOUNTER — Other Ambulatory Visit: Payer: Self-pay | Admitting: Psychiatry

## 2019-07-05 ENCOUNTER — Other Ambulatory Visit: Payer: Self-pay | Admitting: Internal Medicine

## 2019-07-12 ENCOUNTER — Ambulatory Visit: Payer: Medicare Other

## 2019-07-18 ENCOUNTER — Other Ambulatory Visit: Payer: Self-pay | Admitting: Obstetrics & Gynecology

## 2019-07-20 ENCOUNTER — Other Ambulatory Visit: Payer: Self-pay | Admitting: Obstetrics & Gynecology

## 2019-07-26 ENCOUNTER — Other Ambulatory Visit: Payer: Self-pay | Admitting: Obstetrics & Gynecology

## 2019-07-26 NOTE — Telephone Encounter (Signed)
Medication refill request: Euthyrox  Last AEX:  01/27/18 SM Next AEX: 09/19/19 Last MMG (if hormonal medication request): 02/15/18 BIRADS 1 negative/density b Refill authorized: Please advise on refill; Order pended #90 w/0 refills if authorized

## 2019-08-06 ENCOUNTER — Encounter (INDEPENDENT_AMBULATORY_CARE_PROVIDER_SITE_OTHER): Payer: Self-pay

## 2019-08-29 ENCOUNTER — Other Ambulatory Visit: Payer: Self-pay | Admitting: Psychiatry

## 2019-09-16 ENCOUNTER — Other Ambulatory Visit: Payer: Self-pay

## 2019-09-19 ENCOUNTER — Telehealth: Payer: Self-pay

## 2019-09-19 ENCOUNTER — Ambulatory Visit: Payer: Medicare Other | Admitting: Obstetrics & Gynecology

## 2019-09-19 NOTE — Telephone Encounter (Signed)
Tried calling patient regarding rescheduling annual. No answer, left message for patient to call our office back.

## 2019-09-20 ENCOUNTER — Other Ambulatory Visit: Payer: Self-pay

## 2019-09-20 DIAGNOSIS — Z20822 Contact with and (suspected) exposure to covid-19: Secondary | ICD-10-CM

## 2019-09-22 LAB — NOVEL CORONAVIRUS, NAA: SARS-CoV-2, NAA: NOT DETECTED

## 2019-09-26 ENCOUNTER — Ambulatory Visit: Payer: Medicare Other | Admitting: Obstetrics & Gynecology

## 2019-09-26 NOTE — Progress Notes (Deleted)
53 y.o. G1P1 Single White or Caucasian female here for annual exam.    Patient's last menstrual period was 08/04/2012.          Sexually active: {yes no:314532}  The current method of family planning is post menopausal status.    Exercising: {yes no:314532}  {types:19826} Smoker:  yes  Health Maintenance: Pap:   01/27/18 Neg  11/04/16 Neg. HR HPV:neg             09/04/15 Neg History of abnormal Pap:  Yes, ASCUS MMG:  02/15/18 BIRADS 1 negative/density b Colonoscopy:  11/17/14 f/u 10 years BMD:   n/a TDaP:  Td 11/18/12 Pneumonia vaccine(s):  n/a Shingrix:   n/a Hep C testing: *** Screening Labs: ***   reports that she has been smoking cigarettes. She has been smoking about 1.00 pack per day. She has never used smokeless tobacco. She reports that she does not drink alcohol or use drugs.  Past Medical History:  Diagnosis Date  . Anal fissure 1994  . Anxiety   . Arthritis    [facility]. Penni Bombard [facility]  . Depression   . Endometriosis   . Fracture 08/2011   fracture left wrist and elbow  . Hemorrhoids 2008  . Hepatomegaly   . Hypothyroidism   . Mixed hyperlipidemia   . Obesity   . OCD (obsessive compulsive disorder)   . Panic disorder   . Plantar fasciitis, right 03/2014  . Prediabetes 01/16/2015  . RLS (restless legs syndrome) 2014   vis sleep study- no sleep apnea  . Shingles 07/2017  . Tobacco abuse    not tolerated Chantix in the past  . Vitamin D deficiency     Past Surgical History:  Procedure Laterality Date  . CHOLECYSTECTOMY    . COLONOSCOPY  03/25/2007   small external hemorroids  . HEMORROIDECTOMY    . LEFT OOPHORECTOMY    . TONSILLECTOMY      Current Outpatient Medications  Medication Sig Dispense Refill  . Cetirizine HCl (ZYRTEC ALLERGY) 10 MG CAPS Take by mouth.    . Cholecalciferol (VITAMIN D3) 5000 units CAPS Take by mouth daily.    . clonazePAM (KLONOPIN) 1 MG tablet TAKE 1/2 TAB 3 TIMES DAILY,1 TAB AT BEDTIME, AND 1/2 TAB DAILY IF NEEDED 90  tablet 2  . EUTHYROX 75 MCG tablet Take 1 tablet by mouth once daily 90 tablet 0  . gabapentin (NEURONTIN) 100 MG capsule 1-3 capsules as needed for restless legs 90 capsule 5  . ibuprofen (ADVIL,MOTRIN) 200 MG tablet Take 200 mg by mouth every 6 (six) hours as needed.    . mometasone (NASONEX) 50 MCG/ACT nasal spray Place 2 sprays into the nose daily.    . pravastatin (PRAVACHOL) 40 MG tablet Take 1 tablet at Bedtime for Cholesterol 90 tablet 3  . Pseudoephedrine-guaiFENesin (MUCINEX D PO) Take by mouth.    . sertraline (ZOLOFT) 100 MG tablet TAKE 2 AND 1/2 TABLETS BY MOUTH DAILY 225 tablet 0  . sodium chloride (OCEAN) 0.65 % SOLN nasal spray Place 1 spray into both nostrils as needed for congestion.     No current facility-administered medications for this visit.    Family History  Problem Relation Age of Onset  . Breast cancer Mother   . Diabetes Mother   . Drug abuse Brother        and alcohol  . Cirrhosis Father        alcohol  . Heart disease Father  MI  . Colon cancer Maternal Grandmother 23  . Alcohol abuse Sister   . Breast cancer Cousin   . Thyroid disease Neg Hx     Review of Systems  Exam:   LMP 08/04/2012   Height:      Ht Readings from Last 3 Encounters:  11/24/18 5\' 7"  (1.702 m)  07/19/18 5\' 7"  (1.702 m)  04/12/18 5\' 7"  (1.702 m)    General appearance: alert, cooperative and appears stated age Head: Normocephalic, without obvious abnormality, atraumatic Neck: no adenopathy, supple, symmetrical, trachea midline and thyroid {EXAM; THYROID:18604} Lungs: clear to auscultation bilaterally Breasts: {Exam; breast:13139::"normal appearance, no masses or tenderness"} Heart: regular rate and rhythm Abdomen: soft, non-tender; bowel sounds normal; no masses,  no organomegaly Extremities: extremities normal, atraumatic, no cyanosis or edema Skin: Skin color, texture, turgor normal. No rashes or lesions Lymph nodes: Cervical, supraclavicular, and axillary nodes  normal. No abnormal inguinal nodes palpated Neurologic: Grossly normal   Pelvic: External genitalia:  no lesions              Urethra:  normal appearing urethra with no masses, tenderness or lesions              Bartholins and Skenes: normal                 Vagina: normal appearing vagina with normal color and discharge, no lesions              Cervix: {exam; cervix:14595}              Pap taken: {yes no:314532} Bimanual Exam:  Uterus:  {exam; uterus:12215}              Adnexa: {exam; adnexa:12223}               Rectovaginal: Confirms               Anus:  normal sphincter tone, no lesions  Chaperone was present for exam.  A:  Well Woman with normal exam  P:   {plan; gyn:5269::"mammogram","pap smear","return annually or prn"}

## 2019-10-17 ENCOUNTER — Ambulatory Visit (INDEPENDENT_AMBULATORY_CARE_PROVIDER_SITE_OTHER): Payer: Medicare Other | Admitting: Obstetrics & Gynecology

## 2019-10-17 ENCOUNTER — Other Ambulatory Visit: Payer: Self-pay

## 2019-10-17 ENCOUNTER — Other Ambulatory Visit (HOSPITAL_COMMUNITY)
Admission: RE | Admit: 2019-10-17 | Discharge: 2019-10-17 | Disposition: A | Discharge status: Home / Self Care | Payer: Medicare Other | Source: Ambulatory Visit | Attending: Obstetrics & Gynecology | Admitting: Obstetrics & Gynecology

## 2019-10-17 ENCOUNTER — Encounter: Payer: Self-pay | Admitting: Obstetrics & Gynecology

## 2019-10-17 VITALS — BP 118/70 | HR 80 | Temp 96.3°F | Ht 67.0 in | Wt 222.6 lb

## 2019-10-17 DIAGNOSIS — Z0000 Encounter for general adult medical examination without abnormal findings: Secondary | ICD-10-CM | POA: Diagnosis not present

## 2019-10-17 DIAGNOSIS — R300 Dysuria: Secondary | ICD-10-CM | POA: Diagnosis not present

## 2019-10-17 DIAGNOSIS — Z01419 Encounter for gynecological examination (general) (routine) without abnormal findings: Secondary | ICD-10-CM

## 2019-10-17 DIAGNOSIS — Z124 Encounter for screening for malignant neoplasm of cervix: Secondary | ICD-10-CM | POA: Diagnosis not present

## 2019-10-17 NOTE — Progress Notes (Signed)
54 y.o. G1P1 Single White or Caucasian female here for annual exam.  Has experienced much worse depression this year.  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   Denies vaginal bleeding.  Did not get flu shot.  Typically does not get these.    Patient's last menstrual period was 08/04/2012.          Sexually active: No.  The current method of family planning is post menopausal status.    Exercising: No.  The patient does not participate in regular exercise at present. Smoker:  yes  Health Maintenance: Pap:   01/27/18 Neg  11/04/16 Neg. HR HPV:neg             09/04/15 Neg History of abnormal Pap:  Yes, ASCUS MMG:  02/15/18 BIRADS 1 negative/density b Colonoscopy:  11/17/14 Normal f/u 10 years BMD:   Heel test TDaP:  2014  Pneumonia vaccine(s):  n/a Shingrix:   Never Hep C testing: n/a Screening Labs: discuss today   reports that she has been smoking cigarettes. She has been smoking about 1.00 pack per day. She has never used smokeless tobacco. She reports current alcohol use. She reports that she does not use drugs.  Past Medical History:  Diagnosis Date  . Anal fissure 1994  . Anxiety   . Arthritis    Dr. Delilah Shan at [facility]  . Depression   . Endometriosis   . Fracture 08/2011   fracture left wrist and elbow  . Hemorrhoids 2008  . Hepatomegaly   . Hypothyroidism   . Mixed hyperlipidemia   . Obesity   . OCD (obsessive compulsive disorder)   . Panic disorder   . Plantar fasciitis, right 03/2014  . Prediabetes 01/16/2015  . RLS (restless legs syndrome) 2014   vis sleep study- no sleep apnea  . Shingles 07/2017  . Tobacco abuse    not tolerated Chantix in the past  . Vitamin D deficiency     Past Surgical History:  Procedure Laterality Date  . CHOLECYSTECTOMY    . COLONOSCOPY  03/25/2007   small external hemorroids  . HEMORROIDECTOMY    . LEFT OOPHORECTOMY    . TONSILLECTOMY      Current Outpatient Medications  Medication Sig Dispense Refill  .  Cetirizine HCl (ZYRTEC ALLERGY) 10 MG CAPS Take by mouth.    . Cholecalciferol (VITAMIN D3) 5000 units CAPS Take by mouth daily.    . clonazePAM (KLONOPIN) 1 MG tablet TAKE 1/2 TAB 3 TIMES DAILY,1 TAB AT BEDTIME, AND 1/2 TAB DAILY IF NEEDED 90 tablet 2  . EUTHYROX 75 MCG tablet Take 1 tablet by mouth once daily 90 tablet 0  . ibuprofen (ADVIL,MOTRIN) 200 MG tablet Take 200 mg by mouth every 6 (six) hours as needed.    . mometasone (NASONEX) 50 MCG/ACT nasal spray Place 2 sprays into the nose daily.    . pravastatin (PRAVACHOL) 40 MG tablet Take 1 tablet at Bedtime for Cholesterol 90 tablet 3  . Pseudoephedrine-guaiFENesin (MUCINEX D PO) Take by mouth.    . sertraline (ZOLOFT) 100 MG tablet TAKE 2 AND 1/2 TABLETS BY MOUTH DAILY 225 tablet 0  . sodium chloride (OCEAN) 0.65 % SOLN nasal spray Place 1 spray into both nostrils as needed for congestion.    . gabapentin (NEURONTIN) 100 MG capsule 1-3 capsules as needed for restless legs (Patient not taking: Reported on 10/17/2019) 90 capsule 5   No current facility-administered medications for this visit.    Family History  Problem Relation Age of Onset  . Breast cancer Mother   . Diabetes Mother   . Drug abuse Brother        and alcohol  . Cirrhosis Father        alcohol  . Heart disease Father        MI  . Colon cancer Maternal Grandmother 50  . Alcohol abuse Sister   . Breast cancer Cousin   . Thyroid disease Neg Hx     Review of Systems  All other systems reviewed and are negative.   Exam:   BP 118/70 (BP Location: Right Arm, Patient Position: Sitting, Cuff Size: Large)   Pulse 80   Temp (!) 96.3 F (35.7 C) (Temporal)   Ht 5\' 7"  (1.702 m)   Wt 222 lb 9.6 oz (101 kg)   LMP 08/04/2012   BMI 34.86 kg/m   Height: 5\' 7"  (170.2 cm)  Ht Readings from Last 3 Encounters:  10/17/19 5\' 7"  (1.702 m)  11/24/18 5\' 7"  (1.702 m)  07/19/18 5\' 7"  (1.702 m)    General appearance: alert, cooperative and appears stated age Head:  Normocephalic, without obvious abnormality, atraumatic Neck: no adenopathy, supple, symmetrical, trachea midline and thyroid normal to inspection and palpation Lungs: clear to auscultation bilaterally Breasts: normal appearance, no masses or tenderness Heart: regular rate and rhythm Abdomen: soft, non-tender; bowel sounds normal; no masses,  no organomegaly Extremities: extremities normal, atraumatic, no cyanosis or edema Skin: Skin color, texture, turgor normal. No rashes or lesions Lymph nodes: Cervical, supraclavicular, and axillary nodes normal. No abnormal inguinal nodes palpated Neurologic: Grossly normal   Pelvic: External genitalia:  no lesions              Urethra:  normal appearing urethra with no masses, tenderness or lesions              Bartholins and Skenes: normal                 Vagina: normal appearing vagina with normal color and discharge, no lesions              Cervix: no lesions              Pap taken: Yes.   Bimanual Exam:  Uterus:  normal size, contour, position, consistency, mobility, non-tender              Adnexa: normal adnexa and no mass, fullness, tenderness               Rectovaginal: Confirms               Anus:  normal sphincter tone, no lesions  Chaperone, , CMA, was present for exam.  A:  Well Woman with normal exam PMP, no HRT Smoker Anxiety, depression and panic disorder Hypothyroidism Vit D Dysuria Vaginal atrophy  P:   Mammogram guidelines reviewed.  Has MMG scheduled 5/19. pap smear neg 2019 and neg HR HPV 2018.  Pt desires yearly testing.  Guidelines reviewed.   Colonoscopy is UTD Pneumonvax and shingrix vaccination discussed. CBC, CMP, lipids, Vit D, HbA1C Urine micro and culture pending She will start using coconut oil vaginally twice weekly. Return annually or prn

## 2019-10-18 LAB — TSH: TSH: 8.83 u[IU]/mL — ABNORMAL HIGH (ref 0.450–4.500)

## 2019-10-18 LAB — URINALYSIS, MICROSCOPIC ONLY: Casts: NONE SEEN /lpf

## 2019-10-18 LAB — COMPREHENSIVE METABOLIC PANEL
ALT: 19 IU/L (ref 0–32)
AST: 20 IU/L (ref 0–40)
Albumin/Globulin Ratio: 2.2 (ref 1.2–2.2)
Albumin: 4.3 g/dL (ref 3.8–4.9)
Alkaline Phosphatase: 111 IU/L (ref 39–117)
BUN/Creatinine Ratio: 10 (ref 9–23)
BUN: 7 mg/dL (ref 6–24)
Bilirubin Total: <0.2 mg/dL (ref 0.0–1.2)
CO2: 22 mmol/L (ref 20–29)
Calcium: 9.6 mg/dL (ref 8.7–10.2)
Chloride: 103 mmol/L (ref 96–106)
Creatinine, Ser: 0.68 mg/dL (ref 0.57–1.00)
GFR calc Af Amer: 115 mL/min/{1.73_m2} (ref >59)
GFR calc non Af Amer: 99 mL/min/{1.73_m2} (ref >59)
Globulin, Total: 2 g/dL (ref 1.5–4.5)
Glucose: 96 mg/dL (ref 65–99)
Potassium: 4.5 mmol/L (ref 3.5–5.2)
Sodium: 137 mmol/L (ref 134–144)
Total Protein: 6.3 g/dL (ref 6.0–8.5)

## 2019-10-18 LAB — CBC
Hematocrit: 41.7 % (ref 34.0–46.6)
Hemoglobin: 13.9 g/dL (ref 11.1–15.9)
MCH: 30.8 pg (ref 26.6–33.0)
MCHC: 33.3 g/dL (ref 31.5–35.7)
MCV: 93 fL (ref 79–97)
Platelets: 260 10*3/uL (ref 150–450)
RBC: 4.51 x10E6/uL (ref 3.77–5.28)
RDW: 14.1 % (ref 11.7–15.4)
WBC: 7.6 10*3/uL (ref 3.4–10.8)

## 2019-10-18 LAB — LIPID PANEL
Chol/HDL Ratio: 5 ratio — ABNORMAL HIGH (ref 0.0–4.4)
Cholesterol, Total: 235 mg/dL — ABNORMAL HIGH (ref 100–199)
HDL: 47 mg/dL (ref >39)
LDL Chol Calc (NIH): 144 mg/dL — ABNORMAL HIGH (ref 0–99)
Triglycerides: 243 mg/dL — ABNORMAL HIGH (ref 0–149)
VLDL Cholesterol Cal: 44 mg/dL — ABNORMAL HIGH (ref 5–40)

## 2019-10-18 LAB — CYTOLOGY - PAP: Diagnosis: NEGATIVE

## 2019-10-18 LAB — VITAMIN D 25 HYDROXY (VIT D DEFICIENCY, FRACTURES): Vit D, 25-Hydroxy: 9 ng/mL — ABNORMAL LOW (ref 30.0–100.0)

## 2019-10-18 LAB — HEMOGLOBIN A1C
Est. average glucose Bld gHb Est-mCnc: 105 mg/dL
Hgb A1c MFr Bld: 5.3 % (ref 4.8–5.6)

## 2019-10-19 LAB — URINE CULTURE

## 2019-10-26 ENCOUNTER — Other Ambulatory Visit: Payer: Self-pay | Admitting: Obstetrics & Gynecology

## 2019-10-26 LAB — SPECIMEN STATUS REPORT

## 2019-10-26 LAB — T4, FREE: Free T4: 1.05 ng/dL (ref 0.82–1.77)

## 2019-10-26 NOTE — Telephone Encounter (Signed)
Medication refill request: Synthroid 75 mcg  Last AEX:  10-17-19 SM Next AEX: 12-28-20  Last MMG (if hormonal medication request): n/a Refill authorized: Today, please advise.   Medication pended for #90, 0RF. Please refill if appropriate.

## 2019-10-28 ENCOUNTER — Telehealth: Payer: Self-pay | Admitting: *Deleted

## 2019-10-28 NOTE — Telephone Encounter (Signed)
Message left to return call to Myleigh Amara at [phone].   Please route to Triage if I am unavailable.  

## 2019-10-28 NOTE — Telephone Encounter (Signed)
Call to patient. Results reviewed with patient as seen below from Dr. Hyacinth Meeker. Patient states she had stopped taking her vitamin d for "a good 6 months." Has tried the last month and a half to take it again, but states, "I sometimes miss a day." Patient scheduled for 3 month lab recheck on 01-24-20 at 1315. Patient agreeable to date and time of appointment.   Routing to provider and will close encounter.

## 2019-10-28 NOTE — Telephone Encounter (Signed)
-----   Message from Jerene Bears, MD sent at 10/26/2019  1:00 PM EST ----- Pt has seen some of her lab results but I don't know if she's seen them all.  Please let her know her urine micro and culture were negative.  Her CBC was normal.  Her CMP and HbA1C were normal.  Her lipids are elevated at 235 and LDLs 144 but this is still ok to watch.  She does not need treatment for this at this time.  Her Vit D is 9.  It got up to 20 last year.  Is she taking the OTC 5000 IU daily?  Also, her TSH was elevated but I added on a free T4 and this was normal.  I would recommend rechecking these levels in 3 months to ensure they are not changing further.  TSH and free t4 recheck needed 3 months.  Thanks.

## 2019-11-21 ENCOUNTER — Encounter: Payer: Self-pay | Admitting: Internal Medicine

## 2019-11-21 ENCOUNTER — Ambulatory Visit (INDEPENDENT_AMBULATORY_CARE_PROVIDER_SITE_OTHER): Payer: Medicare Other | Admitting: Internal Medicine

## 2019-11-21 ENCOUNTER — Other Ambulatory Visit: Payer: Self-pay

## 2019-11-21 VITALS — BP 116/74 | HR 80 | Temp 97.0°F | Resp 16 | Ht 67.0 in | Wt 225.4 lb

## 2019-11-21 DIAGNOSIS — J0100 Acute maxillary sinusitis, unspecified: Secondary | ICD-10-CM | POA: Diagnosis not present

## 2019-11-21 DIAGNOSIS — H6500 Acute serous otitis media, unspecified ear: Secondary | ICD-10-CM

## 2019-11-21 MED ORDER — DEXAMETHASONE 4 MG PO TABS: ORAL_TABLET | ORAL | 0 refills | Status: DC

## 2019-11-21 MED ORDER — AZITHROMYCIN 250 MG PO TABS: ORAL_TABLET | ORAL | 1 refills | Status: DC

## 2019-11-21 NOTE — Progress Notes (Signed)
Subjective:    Patient ID: Susan Bartlett, female    DOB: 09-09-1966, 54 y.o.   MRN: [id number]  HPI   This is a very nice 54 yo single WF presents with c/o pressure -like discomfort of her rt ear and also discomfort of her rt maxillary cheek. Denies fever, chills, sweats, rash or suspect Covid exposure. No alteration of taste or smell. No cough, SOB or chest congestion.   Medication Sig  . Cetirizine HCl (ZYRTEC ALLERGY) 10 MG CAPS Take by mouth.  . Cholecalciferol (VITAMIN D3) 5000 units CAPS Take by mouth daily.  . clonazePAM (KLONOPIN) 1 MG tablet TAKE 1/2 TAB 3 TIMES DAILY,1 TAB AT BEDTIME, AND 1/2 TAB DAILY IF NEEDED  . gabapentin (NEURONTIN) 100 MG capsule 1-3 capsules as needed for restless legs  . ibuprofen (ADVIL,MOTRIN) 200 MG tablet Take 200 mg by mouth every 6 (six) hours as needed.  Marland Kitchen levothyroxine (SYNTHROID) 75 MCG tablet Take 1 tablet by mouth once daily  . mometasone (NASONEX) 50 MCG/ACT nasal spray Place 2 sprays into the nose daily.  . pravastatin (PRAVACHOL) 40 MG tablet Take 1 tablet at Bedtime for Cholesterol  . Pseudoephedrine-guaiFENesin (MUCINEX D PO) Take by mouth.  . sertraline (ZOLOFT) 100 MG tablet TAKE 2 AND 1/2 TABLETS BY MOUTH DAILY  . sodium chloride (OCEAN) 0.65 % SOLN nasal spray Place 1 spray into both nostrils as needed for congestion.    No Known Allergies   Past Medical History:  Diagnosis Date  . Anal fissure 1994  . Anxiety   . Arthritis    Dr. Penni Bombard at [facility]  . Depression   . Endometriosis   . Fracture 08/2011   fracture left wrist and elbow  . Hemorrhoids 2008  . Hepatomegaly   . Hypothyroidism   . Mixed hyperlipidemia   . Obesity   . OCD (obsessive compulsive disorder)   . Panic disorder   . Plantar fasciitis, right 03/2014  . Prediabetes 01/16/2015  . RLS (restless legs syndrome) 2014   vis sleep study- no sleep apnea  . Shingles 07/2017  . Tobacco abuse    not tolerated Chantix in the past  . Vitamin D  deficiency    Past Surgical History:  Procedure Laterality Date  . CHOLECYSTECTOMY    . HEMORROIDECTOMY    . LEFT OOPHORECTOMY    . TONSILLECTOMY     Review of Systems   10 point systems review negative except as above.    Objective:   Physical Exam  BP 116/74   Pulse 80   Temp (!) 97 F (36.1 C)   Resp 16   Ht 5\' 7"  (1.702 m)   Wt 225 lb 6.4 oz (102.2 kg)   LMP 08/04/2012   BMI 35.30 kg/m   HEENT -  EAC's patent . Lt TM - Nl. Rt TM sl retracted.  N/O/P - clear . Sl tenderness over rt Maxillary area.  Neck - supple.  Chest - Clear equal BS. Cor - Nl HS. RRR w/o sig MGR. PP 1(+). No edema. MS- FROM w/o deformities.  Gait Nl. Neuro -  Nl w/o focal abnormalities.    Assessment & Plan:   1. Acute maxillary sinusitis  - azithromycin (ZITHROMAX) 250 MG tablet; Take 2 tablets with Food on  Day 1, then 1 tablet Daily with Food for Infection  Dispense: 6 each; Refill: 1  - dexamethasone (DECADRON) 4 MG tablet; Take 1 tab 3 x day - 3 days, then 2 x day -  3 days, then 1 tab daily  Dispense: 20 tablet  2. Acute serous otitis media   - Discussed meds & SE's and if sx's persist advised call to schedule ENT f/u.

## 2019-11-25 ENCOUNTER — Ambulatory Visit (INDEPENDENT_AMBULATORY_CARE_PROVIDER_SITE_OTHER): Payer: Medicare Other | Admitting: Otolaryngology

## 2019-12-09 ENCOUNTER — Other Ambulatory Visit: Payer: Self-pay | Admitting: Psychiatry

## 2019-12-13 ENCOUNTER — Ambulatory Visit (INDEPENDENT_AMBULATORY_CARE_PROVIDER_SITE_OTHER): Payer: Medicare Other | Admitting: Otolaryngology

## 2019-12-13 ENCOUNTER — Encounter (INDEPENDENT_AMBULATORY_CARE_PROVIDER_SITE_OTHER): Payer: Self-pay | Admitting: Otolaryngology

## 2019-12-13 ENCOUNTER — Other Ambulatory Visit: Payer: Self-pay

## 2019-12-13 VITALS — Temp 97.7°F

## 2019-12-13 DIAGNOSIS — J310 Chronic rhinitis: Secondary | ICD-10-CM

## 2019-12-13 DIAGNOSIS — H6981 Other specified disorders of Eustachian tube, right ear: Secondary | ICD-10-CM | POA: Diagnosis not present

## 2019-12-13 NOTE — Progress Notes (Signed)
HPI: Susan Bartlett is a 54 y.o. female who returns today for evaluation of right ear symptoms.  She feels like the right ear is full and stuffy but is doing better today.  She had symptoms for several weeks back in February.  She also had a sinus headache and was treated with antibiotics and prednisone.  But the headache was also doing better today.  She has had episodes where she felt acutely dizzy and fell but really did not describe vertigo or spinning sensation.  No significant change in her hearing.  She is actually hearing better today with resolution of the fullness in the ear. She has been using Nasacort as well as Xlear saline rinse which seems to help.  Is also using Zyrtec.Marland Kitchen  Past Medical History:  Diagnosis Date  . Anal fissure 1994  . Anxiety   . Arthritis    Dr. Penni Bombard at [facility]  . Depression   . Endometriosis   . Fracture 08/2011   fracture left wrist and elbow  . Hemorrhoids 2008  . Hepatomegaly   . Hypothyroidism   . Mixed hyperlipidemia   . Obesity   . OCD (obsessive compulsive disorder)   . Panic disorder   . Plantar fasciitis, right 03/2014  . Prediabetes 01/16/2015  . RLS (restless legs syndrome) 2014   vis sleep study- no sleep apnea  . Shingles 07/2017  . Tobacco abuse    not tolerated Chantix in the past  . Vitamin D deficiency    Past Surgical History:  Procedure Laterality Date  . CHOLECYSTECTOMY    . HEMORROIDECTOMY    . LEFT OOPHORECTOMY    . TONSILLECTOMY     Social History   Socioeconomic History  . Marital status: Single    Spouse name: Not on file  . Number of children: 1  . Years of education: Not on file  . Highest education level: Not on file  Occupational History  . Occupation: Agricultural consultant: [employer]  Tobacco Use  . Smoking status: Current Every Day Smoker    Packs/day: 1.00    Years: 31.00    Pack years: 31.00    Types: Cigarettes    Start date: 55  . Smokeless tobacco: Never Used  Substance and  Sexual Activity  . Alcohol use: Yes    Alcohol/week: 0.0 standard drinks    Comment: occ glass of wine  . Drug use: No  . Sexual activity: Not Currently    Birth control/protection: Post-menopausal  Other Topics Concern  . Not on file  Social History Narrative  . Not on file   Social Determinants of Health   Financial Resource Strain:   . Difficulty of Paying Living Expenses: Not on file  Food Insecurity:   . Worried About Programme researcher, broadcasting/film/video in the Last Year: Not on file  . Ran Out of Food in the Last Year: Not on file  Transportation Needs:   . Lack of Transportation (Medical): Not on file  . Lack of Transportation (Non-Medical): Not on file  Physical Activity:   . Days of Exercise per Week: Not on file  . Minutes of Exercise per Session: Not on file  Stress:   . Feeling of Stress : Not on file  Social Connections:   . Frequency of Communication with Friends and Family: Not on file  . Frequency of Social Gatherings with Friends and Family: Not on file  . Attends Religious Services: Not on file  .  Active Member of Clubs or Organizations: Not on file  . Attends Banker Meetings: Not on file  . Marital Status: Not on file   Family History  Problem Relation Age of Onset  . Breast cancer Mother   . Diabetes Mother   . Drug abuse Brother        and alcohol  . Cirrhosis Father        alcohol  . Heart disease Father        MI  . Colon cancer Maternal Grandmother 50  . Alcohol abuse Sister   . Breast cancer Cousin   . Thyroid disease Neg Hx    No Known Allergies Prior to Admission medications   Medication Sig Start Date End Date Taking? Authorizing Provider  azithromycin (ZITHROMAX) 250 MG tablet Take 2 tablets with Food on  Day 1, then 1 tablet Daily with Food for Infection 11/21/19  Yes Lucky Cowboy, MD  Cetirizine HCl (ZYRTEC ALLERGY) 10 MG CAPS Take by mouth.   Yes [provider]  Cholecalciferol (VITAMIN D3) 5000 units CAPS Take by mouth  daily.   Yes [provider]  clonazePAM (KLONOPIN) 1 MG tablet TAKE 1/2 TAB 3 TIMES DAILY,1 TAB AT BEDTIME, AND 1/2 TAB DAILY IF NEEDED 06/29/19  Yes Cottle, Steva Ready., MD  dexamethasone (DECADRON) 4 MG tablet Take 1 tab 3 x day - 3 days, then 2 x day - 3 days, then 1 tab daily 11/21/19  Yes Lucky Cowboy, MD  gabapentin (NEURONTIN) 100 MG capsule 1-3 capsules as needed for restless legs 01/26/19  Yes Cottle, Steva Ready., MD  ibuprofen (ADVIL,MOTRIN) 200 MG tablet Take 200 mg by mouth every 6 (six) hours as needed.   Yes [provider]  levothyroxine (SYNTHROID) 75 MCG tablet Take 1 tablet by mouth once daily 10/30/19  Yes Jerene Bears, MD  mometasone (NASONEX) 50 MCG/ACT nasal spray Place 2 sprays into the nose daily.   Yes [provider]  pravastatin (PRAVACHOL) 40 MG tablet Take 1 tablet at Bedtime for Cholesterol 07/05/19  Yes Lucky Cowboy, MD  Pseudoephedrine-guaiFENesin [facility] D PO) Take by mouth.   Yes [provider]  sertraline (ZOLOFT) 100 MG tablet TAKE 2 & 1/2 (TWO & ONE-HALF) TABLETS BY MOUTH ONCE DAILY 12/09/19  Yes Cottle, Steva Ready., MD  sodium chloride (OCEAN) 0.65 % SOLN nasal spray Place 1 spray into both nostrils as needed for congestion.   Yes [provider]     Positive ROS: Otherwise negative  All other systems have been reviewed and were otherwise negative with the exception of those mentioned in the HPI and as above.  Physical Exam: Constitutional: Alert, well-appearing, no acute distress Ears: External ears without lesions or tenderness. Ear canals are clear bilaterally with intact, clear TMs.  Presently no evidence of ear canal inflammation or infection.  TMs are clear bilaterally with good mobility on pneumatic otoscopy.  Hearing screen with a 512 1024 tuning fork revealed good hearing in both ears which was symmetric with AC > BC bilaterally. Nasal: External nose without lesions. Septum is deviated to the right  with mild rhinitis..  Both middle meatus regions are clear with no signs of active infection or mucopurulent discharge. Oral: Lips and gums without lesions. Tongue and palate mucosa without lesions. Posterior oropharynx clear. Neck: No palpable adenopathy or masses Respiratory: Breathing comfortably  Skin: No facial/neck lesions or rash noted.  Procedures  Assessment: Ears are clear on evaluation today. Mild rhinitis  with septal deviation to the right  Plan: Recommended continue use of the Nasacort clear and Zyrtec as needed.  If she has worsening of her symptoms she will follow-up as needed.  If she continues to have ear problems would recommend follow-up with audiologic testing.  But on clinical exam today ear appears clear.   Radene Journey, MD

## 2019-12-21 DIAGNOSIS — S52121A Displaced fracture of head of right radius, initial encounter for closed fracture: Secondary | ICD-10-CM | POA: Diagnosis not present

## 2019-12-21 DIAGNOSIS — M79601 Pain in right arm: Secondary | ICD-10-CM | POA: Diagnosis not present

## 2019-12-23 ENCOUNTER — Other Ambulatory Visit: Payer: Self-pay | Admitting: Psychiatry

## 2019-12-23 DIAGNOSIS — S52124A Nondisplaced fracture of head of right radius, initial encounter for closed fracture: Secondary | ICD-10-CM | POA: Diagnosis not present

## 2019-12-25 NOTE — Telephone Encounter (Signed)
Last apt 03/2019 was due back 4 months, nothing scheduled

## 2019-12-30 DIAGNOSIS — S52124A Nondisplaced fracture of head of right radius, initial encounter for closed fracture: Secondary | ICD-10-CM | POA: Diagnosis not present

## 2020-01-23 ENCOUNTER — Other Ambulatory Visit: Payer: Self-pay

## 2020-01-24 ENCOUNTER — Other Ambulatory Visit: Payer: Self-pay | Admitting: *Deleted

## 2020-01-24 ENCOUNTER — Other Ambulatory Visit: Payer: Medicare Other

## 2020-01-24 DIAGNOSIS — E559 Vitamin D deficiency, unspecified: Secondary | ICD-10-CM

## 2020-01-24 DIAGNOSIS — R7989 Other specified abnormal findings of blood chemistry: Secondary | ICD-10-CM

## 2020-01-24 DIAGNOSIS — S52124A Nondisplaced fracture of head of right radius, initial encounter for closed fracture: Secondary | ICD-10-CM | POA: Diagnosis not present

## 2020-01-24 DIAGNOSIS — M25531 Pain in right wrist: Secondary | ICD-10-CM | POA: Diagnosis not present

## 2020-01-24 NOTE — Addendum Note (Signed)
Addended by: Leda Min on: 01/24/2020 09:10 AM   Modules accepted: Orders

## 2020-01-27 ENCOUNTER — Other Ambulatory Visit (INDEPENDENT_AMBULATORY_CARE_PROVIDER_SITE_OTHER): Payer: Medicare Other

## 2020-01-27 ENCOUNTER — Other Ambulatory Visit: Payer: Self-pay

## 2020-01-27 DIAGNOSIS — E559 Vitamin D deficiency, unspecified: Secondary | ICD-10-CM | POA: Diagnosis not present

## 2020-01-27 DIAGNOSIS — M79601 Pain in right arm: Secondary | ICD-10-CM | POA: Diagnosis not present

## 2020-01-27 DIAGNOSIS — R7989 Other specified abnormal findings of blood chemistry: Secondary | ICD-10-CM

## 2020-01-30 LAB — VITAMIN D 25 HYDROXY (VIT D DEFICIENCY, FRACTURES): Vit D, 25-Hydroxy: 8.7 ng/mL — ABNORMAL LOW (ref 30.0–100.0)

## 2020-01-30 LAB — T4, FREE: Free T4: 1.08 ng/dL (ref 0.82–1.77)

## 2020-01-30 LAB — TSH: TSH: 7.23 u[IU]/mL — ABNORMAL HIGH (ref 0.450–4.500)

## 2020-02-01 ENCOUNTER — Encounter: Payer: Medicare Other | Admitting: Physician Assistant

## 2020-02-07 ENCOUNTER — Telehealth: Payer: Self-pay

## 2020-02-07 DIAGNOSIS — R7989 Other specified abnormal findings of blood chemistry: Secondary | ICD-10-CM

## 2020-02-07 DIAGNOSIS — E559 Vitamin D deficiency, unspecified: Secondary | ICD-10-CM

## 2020-02-07 MED ORDER — LEVOTHYROXINE SODIUM 88 MCG PO TABS: 88.0000 ug | ORAL_TABLET | Freq: Every day | ORAL | 2 refills | Status: DC

## 2020-02-07 MED ORDER — VITAMIN D (ERGOCALCIFEROL) 1.25 MG (50000 UNIT) PO CAPS: 50000.0000 [IU] | ORAL_CAPSULE | ORAL | 0 refills | Status: DC

## 2020-02-07 NOTE — Telephone Encounter (Signed)
Spoke with pt. Pt given results and recommendations per Dr Hyacinth Meeker. Pt agreeable and verbalized understanding.   Rx Synthroid # 30, 2 RF sent to pharmacy Rx Vitamin D 50,000 IU twice weekly # 30, 0RF sent to pharmacy   Pt has lab appt for Vit D, TSH and freeT4 on 05/15/2020 at 1:15 pm. Future orders placed.   Pt would like Dr Hyacinth Meeker to send labs to Dr Oneta Rack. Thank you.   Routing to Dr Hyacinth Meeker for review.  Encounter closed.

## 2020-02-07 NOTE — Telephone Encounter (Signed)
-----   Message from Jerene Bears, MD sent at 02/07/2020 12:58 PM EDT ----- Please let pt know her TSH is a little higher.  Free T4 is still in the normal range but on the lower side of normal.  It would be appropriate to increase the synthroid just a little bit to daily and repeat the TSH and free t4 in 3 months.  Dr. Oneta Rack is her PCP so I can forward the lab work to him if she desires.  Her Vit D is still low.  I can never seem to get her above 20.  She needs to be taking the 50K dosing twice weekly if she can tolerate it.  Ok to send in rx if she needs it.

## 2020-02-09 ENCOUNTER — Other Ambulatory Visit: Payer: Self-pay

## 2020-02-09 ENCOUNTER — Encounter: Payer: Self-pay | Admitting: Psychiatry

## 2020-02-09 ENCOUNTER — Ambulatory Visit (INDEPENDENT_AMBULATORY_CARE_PROVIDER_SITE_OTHER): Payer: Medicare Other | Admitting: Psychiatry

## 2020-02-09 DIAGNOSIS — G2581 Restless legs syndrome: Secondary | ICD-10-CM

## 2020-02-09 DIAGNOSIS — F4001 Agoraphobia with panic disorder: Secondary | ICD-10-CM

## 2020-02-09 DIAGNOSIS — F422 Mixed obsessional thoughts and acts: Secondary | ICD-10-CM | POA: Diagnosis not present

## 2020-02-09 DIAGNOSIS — F5105 Insomnia due to other mental disorder: Secondary | ICD-10-CM

## 2020-02-09 DIAGNOSIS — F411 Generalized anxiety disorder: Secondary | ICD-10-CM | POA: Diagnosis not present

## 2020-02-09 MED ORDER — SERTRALINE HCL 100 MG PO TABS: 250.0000 mg | ORAL_TABLET | Freq: Every day | ORAL | 1 refills | Status: DC

## 2020-02-09 MED ORDER — CLONAZEPAM 1 MG PO TABS: ORAL_TABLET | ORAL | 5 refills | Status: DC

## 2020-02-09 NOTE — Progress Notes (Signed)
Susan Bartlett [id number] [birth year] 54 y.o.    Subjective:   Patient ID:  Susan Bartlett is a 54 y.o. (DOB 08-Feb-1966) female.  Chief Complaint:  Chief Complaint  Patient presents with  . grief  . Anxiety  . Follow-up    Anxiety Symptoms include dizziness and nervous/anxious behavior. Patient reports no confusion, decreased concentration or suicidal ideas.    Depression        Associated symptoms include no decreased concentration and no suicidal ideas.  Past medical history includes anxiety.    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  visit  January 26, 2019.  The following changes were recommended. In short-term increase clonazepam for anxiety. For longer term, exceed the usual dose of sertraline for TR anxieety to 250 mg daily.  Disc SE and she agrees. Watch caffeine, avoid after noon. FOR RLS,  Gabapentin prn 100-300 mg PM  seen June 2020.  The following was noted: Forgot to increase sertraline.  Did increase the Klonopin for panic.  Now taking 1/2 of 1mg   TID and 1 at HS.  Panic varies from 0 to 3 daily.  Not watching news nor social media. Allerigies and off balance feelings will trigger panic.  All physical sx trigger her to panic.  Using an App called with meditation. Felt panicky talking to asst today about her meds.  Not leaving the house.  Scared to drive DT protests and Covid.  No SE with meds.  Rough DT Covid and fear of it.  A whole lot more anxiety and more panic.  Stopped watching news about 10 days ago.  Was watching it all the time. It is somewhat better.  Has bad sinuses and ears full of fluid.  Scares me then panics.  Watches her church services. Gabapentin helps restless legs and anxiety but feels groggy a bit the next day. She can tell an affect when she takes it. Plan:For longer term, exceed the usual dose of sertraline for TR anxieety to 250 mg daily.  Disc SE and  she agrees though she didn't do it last time.    February 09, 2020 appointment the following is noted: "I need your help".  55 yo neice died March 19, 2024abruptly unclear reason but attributed to GI causes.  Died in the hospital.   Triggered fears about her son dying.  Some days not getting OOB.  March 12 and broke her arm DT balance problems chronically.  Anxiety remains severe.   Didn't get Covid vaccine yet.  Stopped gabapentin and RLS is worse.  Afraid of meds.   Anxiety and not that depressed but grieving.    Previous medication trials include topiramate, risperidone, cyclobenzaprine, quetiapine,  paroxetine 50 mg, fluvoxamine, sertraline 200, Lexapro among others for anxiety.   ropinirole  restless legs in the past.  gabapentin,  Clonazepam, alprazolam, buspirone,  pindolol, Review of Systems:  Review of Systems  Neurological: Positive for dizziness. Negative for tremors and weakness.  Psychiatric/Behavioral: Positive for depression and sleep disturbance. Negative for agitation, behavioral problems, confusion, decreased concentration, dysphoric mood, hallucinations and suicidal ideas. The patient is nervous/anxious. The patient is not hyperactive.     Medications: I have reviewed the patient's current medications.  Current Outpatient Medications  Medication Sig Dispense Refill  . azithromycin (ZITHROMAX) 250 MG tablet Take 2 tablets with Food on  Day 1, then 1 tablet Daily with Food for Infection 6 each 1  .  Cetirizine HCl (ZYRTEC ALLERGY) 10 MG CAPS Take by mouth.    . Cholecalciferol (VITAMIN D3) 5000 units CAPS Take by mouth daily.    . clonazePAM (KLONOPIN) 1 MG tablet TAKE 1/2 TABLET 3 TIMES A DAY, 1 TABLET AT BEDTIME AND 1/2 TABLET DAILY AS NEEDED 90 tablet 0  . dexamethasone (DECADRON) 4 MG tablet Take 1 tab 3 x day - 3 days, then 2 x day - 3 days, then 1 tab daily 20 tablet 0  . ibuprofen (ADVIL,MOTRIN) 200 MG tablet Take 200 mg by mouth every 6 (six) hours as needed.    Marland Kitchen levothyroxine  (SYNTHROID) 88 MCG tablet Take 1 tablet (88 mcg total) by mouth daily before breakfast. 30 tablet 2  . mometasone (NASONEX) 50 MCG/ACT nasal spray Place 2 sprays into the nose daily.    . pravastatin (PRAVACHOL) 40 MG tablet Take 1 tablet at Bedtime for Cholesterol 90 tablet 3  . Pseudoephedrine-guaiFENesin (MUCINEX D PO) Take by mouth.    . sertraline (ZOLOFT) 100 MG tablet TAKE 2 & 1/2 (TWO & ONE-HALF) TABLETS BY MOUTH ONCE DAILY 225 tablet 0  . sodium chloride (OCEAN) 0.65 % SOLN nasal spray Place 1 spray into both nostrils as needed for congestion.    . Vitamin D, Ergocalciferol, (DRISDOL) 1.25 MG (50000 UNIT) CAPS capsule Take 1 capsule (50,000 Units total) by mouth 2 (two) times a week. 30 capsule 0  . gabapentin (NEURONTIN) 100 MG capsule 1-3 capsules as needed for restless legs (Patient not taking: Reported on 02/09/2020) 90 capsule 5   No current facility-administered medications for this visit.    Medication Side Effects: None  Allergies: No Known Allergies  Past Medical History:  Diagnosis Date  . Anal fissure 1994  . Anxiety   . Arthritis    Dr. Penni Bombard at [facility]  . Depression   . Endometriosis   . Fracture 08/2011   fracture left wrist and elbow  . Hemorrhoids 2008  . Hepatomegaly   . Hypothyroidism   . Mixed hyperlipidemia   . Obesity   . OCD (obsessive compulsive disorder)   . Panic disorder   . Plantar fasciitis, right 03/2014  . Prediabetes 01/16/2015  . RLS (restless legs syndrome) 2014   vis sleep study- no sleep apnea  . Shingles 07/2017  . Tobacco abuse    not tolerated Chantix in the past  . Vitamin D deficiency     Family History  Problem Relation Age of Onset  . Breast cancer Mother   . Diabetes Mother   . Drug abuse Brother        and alcohol  . Cirrhosis Father        alcohol  . Heart disease Father        MI  . Colon cancer Maternal Grandmother 50  . Alcohol abuse Sister   . Breast cancer Cousin   . Thyroid disease Neg Hx      Social History   Socioeconomic History  . Marital status: Single    Spouse name: Not on file  . Number of children: 1  . Years of education: Not on file  . Highest education level: Not on file  Occupational History  . Occupation: Agricultural consultant: [employer]  Tobacco Use  . Smoking status: Current Every Day Smoker    Packs/day: 1.00    Years: 31.00    Pack years: 31.00    Types: Cigarettes    Start date: 9  . Smokeless  tobacco: Never Used  Substance and Sexual Activity  . Alcohol use: Yes    Alcohol/week: 0.0 standard drinks    Comment: occ glass of wine  . Drug use: No  . Sexual activity: Not Currently    Birth control/protection: Post-menopausal  Other Topics Concern  . Not on file  Social History Narrative  . Not on file   Social Determinants of Health   Financial Resource Strain:   . Difficulty of Paying Living Expenses:   Food Insecurity:   . Worried About Charity fundraiser in the Last Year:   . Arboriculturist in the Last Year:   Transportation Needs:   . Film/video editor (Medical):   Marland Kitchen Lack of Transportation (Non-Medical):   Physical Activity:   . Days of Exercise per Week:   . Minutes of Exercise per Session:   Stress:   . Feeling of Stress :   Social Connections:   . Frequency of Communication with Friends and Family:   . Frequency of Social Gatherings with Friends and Family:   . Attends Religious Services:   . Active Member of Clubs or Organizations:   . Attends Archivist Meetings:   Marland Kitchen Marital Status:   Intimate Partner Violence:   . Fear of Current or Ex-Partner:   . Emotionally Abused:   Marland Kitchen Physically Abused:   . Sexually Abused:     Past Medical History, Surgical history, Social history, and Family history were reviewed and updated as appropriate.   Please see review of systems for further details on the patient's review from today.   Objective:   Physical Exam:  LMP 08/04/2012   Physical  Exam Constitutional:      General: She is not in acute distress. Musculoskeletal:        General: No deformity.  Neurological:     Mental Status: She is alert and oriented to person, place, and time.     Cranial Nerves: No dysarthria.     Coordination: Coordination normal.  Psychiatric:        Attention and Perception: Attention and perception normal. She does not perceive auditory or visual hallucinations.        Mood and Affect: Mood is anxious. Mood is not depressed. Affect is tearful. Affect is not labile, blunt, angry or inappropriate.        Speech: Speech normal.        Behavior: Behavior normal. Behavior is cooperative.        Thought Content: Thought content normal. Thought content is not paranoid or delusional. Thought content does not include homicidal or suicidal ideation. Thought content does not include homicidal or suicidal plan.        Cognition and Memory: Cognition and memory normal.        Judgment: Judgment normal.     Comments: Chronic anxiety and somatic concerns. Grief.   A lot of questions and reassurance seeking. Lab Review:     Component Value Date/Time   NA 137 10/17/2019 1100   K 4.5 10/17/2019 1100   CL 103 10/17/2019 1100   CO2 22 10/17/2019 1100   GLUCOSE 96 10/17/2019 1100   GLUCOSE 83 11/24/2018 1701   BUN 7 10/17/2019 1100   CREATININE 0.68 10/17/2019 1100   CREATININE 0.81 11/24/2018 1701   CALCIUM 9.6 10/17/2019 1100   PROT 6.3 10/17/2019 1100   ALBUMIN 4.3 10/17/2019 1100   AST 20 10/17/2019 1100   ALT 19 10/17/2019 1100   ALKPHOS 111  10/17/2019 1100   BILITOT <0.2 10/17/2019 1100   GFRNONAA 99 10/17/2019 1100   GFRNONAA 82 11/24/2018 1701   GFRAA 115 10/17/2019 1100   GFRAA 95 11/24/2018 1701       Component Value Date/Time   WBC 7.6 10/17/2019 1100   WBC 7.7 11/24/2018 1701   RBC 4.51 10/17/2019 1100   RBC 4.70 11/24/2018 1701   HGB 13.9 10/17/2019 1100   HGB 14.1 02/03/2013 1523   HCT 41.7 10/17/2019 1100   PLT 260  10/17/2019 1100   MCV 93 10/17/2019 1100   MCH 30.8 10/17/2019 1100   MCH 30.9 11/24/2018 1701   MCHC 33.3 10/17/2019 1100   MCHC 34.0 11/24/2018 1701   RDW 14.1 10/17/2019 1100   LYMPHSABS 1,555 11/24/2018 1701   MONOABS 426 09/16/2016 1249   EOSABS 362 11/24/2018 1701   BASOSABS 69 11/24/2018 1701    No results found for: POCLITH, LITHIUM   No results found for: PHENYTOIN, PHENOBARB, VALPROATE, CBMZ   .res Assessment: Plan:    Lannah was seen today for grief, anxiety and follow-up.  Diagnoses and all orders for this visit:  Panic disorder with agoraphobia  Mixed obsessional thoughts and acts  Generalized anxiety disorder  Restless leg syndrome, uncontrolled  Insomnia due to mental condition   Greater than 50% of face to face time with patient was spent on counseling and coordination of care. We discussed her phobia of medications and other psych dxes.  I believe the difference she notices between the Teva generic and other generics of clonazepam is a real and legitimate physiological effect as I have heard other patients report the same.  However we have not been able to control her anxiety despite the use of an SSRI plus benzodiazepine.  Her anxiety remains treatment resistant she is better with these medicines then without them.  She is fearful of medication changes generally.  She has had psychotherapy with limited additional progress.  She has also been med sensitive.  Often afraid of med changes.  Chronic somatic fears worse with Covid.  Reassurance and CBT for chronic anxiety and obsessiveness and fears of anxiety.  She remains disabled  She uses self-care and relaxation techniques.   Continue current clonazepam for anxiety. We discussed the short-term risks associated with benzodiazepines including sedation and increased fall risk among others.  Discussed long-term side effect risk including dependence, potential withdrawal symptoms, and the potential eventual  dose-related risk of dementia.  For longer term, continue exceed the usual dose of sertraline for TR anxieety to 250 mg daily.  She tolerated it but hard to tell the effect.  Disc anxiety and insomnia risks with pseudofed.  Watch caffeine, avoid after noon.  Low vitamin D discussed.  Level 8.7 is very low.  Grief work done with death of 55 yo D.   Rec Hospice counseling.    This appt was 30 mins.  FU 4 mos  Meredith Staggers, MD, DFAPA  Please see After Visit Summary for patient specific instructions.  Future Appointments  Date Time Provider Department Center  04/09/2020  3:00 PM Quentin Mulling, PA-C [facility] None  05/15/2020  1:15 PM [facility] None  12/28/2020  1:00 PM Jerene Bears, MD [facility] None    No orders of the defined types were placed in this encounter.     -------------------------------

## 2020-03-04 ENCOUNTER — Encounter: Payer: Self-pay | Admitting: Obstetrics & Gynecology

## 2020-03-21 ENCOUNTER — Ambulatory Visit: Payer: Medicare Other

## 2020-03-23 ENCOUNTER — Inpatient Hospital Stay: Admission: RE | Admit: 2020-03-23 | Payer: Medicare Other | Source: Ambulatory Visit

## 2020-04-09 ENCOUNTER — Encounter: Payer: Medicare Other | Admitting: Physician Assistant

## 2020-05-15 ENCOUNTER — Other Ambulatory Visit: Payer: Self-pay

## 2020-05-22 ENCOUNTER — Telehealth: Payer: Self-pay | Admitting: Obstetrics & Gynecology

## 2020-05-22 DIAGNOSIS — R7989 Other specified abnormal findings of blood chemistry: Secondary | ICD-10-CM

## 2020-05-22 NOTE — Telephone Encounter (Signed)
Medication refill request: Levothyroxine  Last AEX:  10/17/19 Next AEX: 12/28/20 Last MMG (if hormonal medication request): 02/15/18  Normal  Refill authorized: 30/0   Dr Rondel Baton pt - note forwarded to Dr Edward Jolly given Dr Hyacinth Meeker out of the office this week.   Julious Payer, CMA

## 2020-05-22 NOTE — Telephone Encounter (Signed)
Please contact patient to recheck her thyroid function.   She cancelled her lab appointment for a recheck of her levels.   Dr. Hyacinth Meeker modified her dose of her levothyroxine.   I will refill her levothyroxine for one month.

## 2020-05-23 NOTE — Telephone Encounter (Signed)
Patient is returning call.  °

## 2020-05-23 NOTE — Telephone Encounter (Signed)
Left message for pt to return call to triage RN. 

## 2020-05-23 NOTE — Telephone Encounter (Signed)
Spoke with pt. Pt states was unaware of cancelled lab appt. Pt states had death in family around that time of lab appt. Pt agreeable to reschedule. Pt scheduled for Lab appt on 8/12 at 345 pm. Pt verbalized understanding of date and time of appt.  Pt thankful for refill. States only had 1 pill left for today.   Routing to Dr Edward Jolly for update.  Encounter closed

## 2020-05-24 ENCOUNTER — Other Ambulatory Visit (INDEPENDENT_AMBULATORY_CARE_PROVIDER_SITE_OTHER): Payer: Medicare Other

## 2020-05-24 ENCOUNTER — Other Ambulatory Visit: Payer: Self-pay

## 2020-05-24 DIAGNOSIS — R7989 Other specified abnormal findings of blood chemistry: Secondary | ICD-10-CM | POA: Diagnosis not present

## 2020-05-24 DIAGNOSIS — E559 Vitamin D deficiency, unspecified: Secondary | ICD-10-CM

## 2020-05-24 DIAGNOSIS — E039 Hypothyroidism, unspecified: Secondary | ICD-10-CM | POA: Diagnosis not present

## 2020-05-25 ENCOUNTER — Telehealth: Payer: Self-pay | Admitting: Obstetrics & Gynecology

## 2020-05-25 LAB — T4, FREE: Free T4: 1.01 ng/dL (ref 0.82–1.77)

## 2020-05-25 LAB — VITAMIN D 25 HYDROXY (VIT D DEFICIENCY, FRACTURES): Vit D, 25-Hydroxy: 6.3 ng/mL — ABNORMAL LOW (ref 30.0–100.0)

## 2020-05-25 LAB — TSH: TSH: 8.06 u[IU]/mL — ABNORMAL HIGH (ref 0.450–4.500)

## 2020-05-25 NOTE — Telephone Encounter (Signed)
Patient said she returned a call to [place].

## 2020-05-25 NOTE — Telephone Encounter (Signed)
Spoke with pt. Pt given results and recommendations per Dr Oscar La. Pt states had not been taking the Vit D 50K Rx that was given by Dr Hyacinth Meeker before lab appt. Also pt asking about elevated TSH. Pt states has been taking Synthroid Rx as prescribed but takes it at different times each day, early hours in morning when awake due to insomnia from niece's recent death in 01/01/2023, Pt wanting to know if the results are due to not taking every day at same time?  Pt advised Dr Hyacinth Meeker not in office today, will follow up with her after reviewing labs and return call to pt. Pt agreeable.   Routing to Dr Annice Pih, Craig Guess, MD  [facility] Triage Pool Her vit D level is very low and her TSH is high, free T4 is normal. Since it has been difficult to get her in the normal range, I would consider f/u with her primary. I will leave the results for Dr Hyacinth Meeker to review, she may have other suggestions.

## 2020-06-09 ENCOUNTER — Encounter: Payer: Self-pay | Admitting: Obstetrics & Gynecology

## 2020-06-11 ENCOUNTER — Other Ambulatory Visit: Payer: Self-pay | Admitting: Obstetrics & Gynecology

## 2020-06-11 NOTE — Telephone Encounter (Signed)
Pt sent Mychart message regarding Vit D Rx.   Pt was prescribed Vit D in 01/2020 but took Vit D2 instead. Now pt states needing new Rx refill of Vit D3   Last Vit D level: 05/24/20: 6.3  Routing to Dr Hyacinth Meeker for recommendations/ advice   Rx pended for Vit D 3 50 K twice a week  # 30, 0RF?  Please advise.

## 2020-06-11 NOTE — Telephone Encounter (Signed)
Fle[place], [facility] Clinical Pool Hi Dr Hyacinth Meeker,  Back in April you prescribed vitamin D2 for my low levels. I messaged you in May and you said when it was time for a refill you wanted to change it to Vitamin D3. So I'm letting you know it's time for a refill. Can you please prescribe the D3?  I hope you are doing well.  Lucio Edward  [phone]

## 2020-06-13 MED ORDER — VITAMIN D (ERGOCALCIFEROL) 1.25 MG (50000 UNIT) PO CAPS: 50000.0000 [IU] | ORAL_CAPSULE | ORAL | 3 refills | Status: DC

## 2020-06-13 NOTE — Telephone Encounter (Signed)
Spoke with pt. Pt given update on Vit D Rx per Dr Hyacinth Meeker. Pt agreeable.   Pt asking about TSH and any other suggestions due to the elevation from lab work on 05/24/20.   Advised will review with Dr Hyacinth Meeker and return call. Pt agreeable.   Routing to Dr Hyacinth Meeker.

## 2020-06-13 NOTE — Telephone Encounter (Signed)
Spoke with pt. Pt given update re: Rx for Vit D. Pt agreeable and verbalized understanding.  Encounter closed.

## 2020-06-15 ENCOUNTER — Other Ambulatory Visit: Payer: Self-pay | Admitting: Obstetrics and Gynecology

## 2020-06-15 DIAGNOSIS — R7989 Other specified abnormal findings of blood chemistry: Secondary | ICD-10-CM

## 2020-06-19 NOTE — Telephone Encounter (Signed)
Medication refill request: Euthyrox  Last AEX:  10/17/19 Next AEX: 12/28/20 Last MMG (if hormonal medication request): NA Refill authorized: 30/0

## 2020-06-19 NOTE — Telephone Encounter (Signed)
Routing to Dr. Hyacinth Meeker, patient's primary gynecologist.

## 2020-06-23 ENCOUNTER — Other Ambulatory Visit: Payer: Self-pay | Admitting: Obstetrics & Gynecology

## 2020-06-23 DIAGNOSIS — R7989 Other specified abnormal findings of blood chemistry: Secondary | ICD-10-CM

## 2020-06-25 ENCOUNTER — Telehealth: Payer: Self-pay

## 2020-06-25 NOTE — Telephone Encounter (Signed)
Called pt personally.  Needs RF for thyroid.  We discussed labs.  D/w pt she may need a change in her thyroid medication.  Pt reports she wasn't taking this regularly or around the same time.  Is being more careful about this.  Doesn't want to make medication change at this time and would like to recheck this.  She is taking her Vit D regularly.  Rx for thyroid medication sent to pharmacy for 90 day supply.   Pt also reports her niece died in her home due to bowel perforation.  She had two young sons.  Pt's sister (niece's mother) is living with her.  This has been very sad for them and pt is having sleeping issues.  This has impacted her taking her medicine.  They are coping.    She also had some questions about Covid vaccinations as well.  She has not done this yet.  Is scared about getting vaccinated and about getting Covid.  D/w pt that I am more worried about her having Covid than any side effects associated with the vaccine.  After discussing this, she has decided to go ahead and be vaccinated.  She's going to schedule this with her local [facility].    Ok to close encounter.

## 2020-06-25 NOTE — Telephone Encounter (Signed)
Pt calling to get update from Dr Hyacinth Meeker on thyroid meds.   Routing to Dr Hyacinth Meeker for recommendations/ advice.    Romualdo Bolk, MD  05/25/2020 9:23 AM EDT     Her vit D level is very low and her TSH is high, free T4 is normal. Since it has been difficult to get her in the normal range, I would consider f/u with her primary. I will leave the results for Dr Hyacinth Meeker to review, she may have other suggestions.

## 2020-06-25 NOTE — Telephone Encounter (Signed)
Patient is calling in regards to questions about thyroid medication.

## 2020-06-26 NOTE — Telephone Encounter (Signed)
Spoke with pt personally.  Separate phone note updated.  Ok to close encounter.  Thanks.

## 2020-07-25 ENCOUNTER — Other Ambulatory Visit: Payer: Self-pay | Admitting: Internal Medicine

## 2020-08-08 ENCOUNTER — Encounter: Payer: Self-pay | Admitting: Psychiatry

## 2020-08-08 ENCOUNTER — Ambulatory Visit (INDEPENDENT_AMBULATORY_CARE_PROVIDER_SITE_OTHER): Payer: Medicare Other | Admitting: Psychiatry

## 2020-08-08 ENCOUNTER — Other Ambulatory Visit: Payer: Self-pay

## 2020-08-08 DIAGNOSIS — F331 Major depressive disorder, recurrent, moderate: Secondary | ICD-10-CM

## 2020-08-08 DIAGNOSIS — F422 Mixed obsessional thoughts and acts: Secondary | ICD-10-CM | POA: Diagnosis not present

## 2020-08-08 DIAGNOSIS — F4321 Adjustment disorder with depressed mood: Secondary | ICD-10-CM | POA: Diagnosis not present

## 2020-08-08 DIAGNOSIS — F411 Generalized anxiety disorder: Secondary | ICD-10-CM

## 2020-08-08 DIAGNOSIS — F5105 Insomnia due to other mental disorder: Secondary | ICD-10-CM

## 2020-08-08 DIAGNOSIS — G2581 Restless legs syndrome: Secondary | ICD-10-CM

## 2020-08-08 DIAGNOSIS — R7989 Other specified abnormal findings of blood chemistry: Secondary | ICD-10-CM

## 2020-08-08 DIAGNOSIS — F4001 Agoraphobia with panic disorder: Secondary | ICD-10-CM | POA: Diagnosis not present

## 2020-08-08 MED ORDER — SERTRALINE HCL 100 MG PO TABS: 250.0000 mg | ORAL_TABLET | Freq: Every day | ORAL | 1 refills | Status: DC

## 2020-08-08 MED ORDER — ARIPIPRAZOLE 5 MG PO TABS: 5.0000 mg | ORAL_TABLET | Freq: Every day | ORAL | 1 refills | Status: DC

## 2020-08-08 MED ORDER — CLONAZEPAM 1 MG PO TABS: ORAL_TABLET | ORAL | 5 refills | Status: DC
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
## 2020-09-11 ENCOUNTER — Other Ambulatory Visit: Payer: Self-pay | Admitting: Psychiatry

## 2020-09-11 DIAGNOSIS — F411 Generalized anxiety disorder: Secondary | ICD-10-CM

## 2020-09-11 DIAGNOSIS — F4001 Agoraphobia with panic disorder: Secondary | ICD-10-CM

## 2020-09-11 NOTE — Telephone Encounter (Signed)
Apt 12/29 

## 2020-10-10 ENCOUNTER — Ambulatory Visit: Payer: Medicare Other | Admitting: Psychiatry

## 2020-10-26 ENCOUNTER — Other Ambulatory Visit: Payer: Self-pay | Admitting: Internal Medicine

## 2020-11-01 ENCOUNTER — Telehealth: Payer: Self-pay | Admitting: Psychiatry

## 2020-11-01 NOTE — Telephone Encounter (Signed)
Shamaine's son has COVID and she is going to get tested. She wants to know if its ok to take her medicines with the following, Vitamin C, Zinc and Mullein Leaf 1000 mg. Her number is [phone].

## 2020-11-02 NOTE — Telephone Encounter (Signed)
Please let her know it is fine to take those supplements with her meds.

## 2020-11-05 ENCOUNTER — Other Ambulatory Visit: Payer: Self-pay | Admitting: Psychiatry

## 2020-11-05 DIAGNOSIS — F411 Generalized anxiety disorder: Secondary | ICD-10-CM

## 2020-11-05 DIAGNOSIS — F4001 Agoraphobia with panic disorder: Secondary | ICD-10-CM

## 2020-11-05 NOTE — Telephone Encounter (Signed)
Next apt 03/10

## 2020-12-20 ENCOUNTER — Ambulatory Visit: Payer: Medicare Other | Admitting: Psychiatry

## 2020-12-28 ENCOUNTER — Ambulatory Visit: Payer: Self-pay

## 2021-01-04 ENCOUNTER — Telehealth: Payer: Self-pay | Admitting: Psychiatry

## 2021-01-04 NOTE — Telephone Encounter (Signed)
Pt called and has a question. She ordered a lotion that is a balm that you rub on the skin to help with muscles aches. She read the ingredients and it has 100 % CBD in it and she wants to know is that ok for her to use with all the medicines that she takes. She is afraid that means it has marajuna in it. She will not take it without Dr. Alwyn Ren approval. Please call her at [phone]

## 2021-01-05 NOTE — Telephone Encounter (Signed)
Please review

## 2021-01-06 NOTE — Telephone Encounter (Signed)
Yes. It's OK.

## 2021-01-17 ENCOUNTER — Other Ambulatory Visit: Payer: Self-pay | Admitting: Obstetrics & Gynecology

## 2021-01-17 DIAGNOSIS — R7989 Other specified abnormal findings of blood chemistry: Secondary | ICD-10-CM

## 2021-01-18 ENCOUNTER — Other Ambulatory Visit: Payer: Self-pay | Admitting: Psychiatry

## 2021-01-18 ENCOUNTER — Other Ambulatory Visit: Payer: Self-pay

## 2021-01-18 ENCOUNTER — Ambulatory Visit
Admission: EM | Admit: 2021-01-18 | Discharge: 2021-01-18 | Disposition: A | Discharge status: Home / Self Care | Payer: Medicare Other | Attending: Emergency Medicine | Admitting: Emergency Medicine

## 2021-01-18 DIAGNOSIS — M5442 Lumbago with sciatica, left side: Secondary | ICD-10-CM | POA: Diagnosis not present

## 2021-01-18 MED ORDER — IBUPROFEN 600 MG PO TABS: 600.0000 mg | ORAL_TABLET | Freq: Four times a day (QID) | ORAL | 0 refills | Status: DC | PRN

## 2021-01-18 MED ORDER — METHOCARBAMOL 500 MG PO TABS: 500.0000 mg | ORAL_TABLET | Freq: Two times a day (BID) | ORAL | 0 refills | Status: DC | PRN

## 2021-01-18 NOTE — ED Provider Notes (Signed)
[facility]    CSN: [id number] Arrival date & time: 01/18/21  1842      History   Chief Complaint Chief Complaint  Patient presents with  . Back Pain    HPI Susan Bartlett is a 55 y.o. female.   Patient presents with left lower back pain which intermittently radiates down her left leg to her thigh.  Her back pain started this morning.  No falls or injury.  The pain is worse with movement and position; improves with rest.  Treatment attempted at home with ibuprofen.  Patient denies numbness, weakness, saddle anesthesia, loss of bowel/bladder control, abdominal pain, dysuria, pelvic pain, or other symptoms.  Her medical history includes prediabetes, hepatomegaly, hypothyroidism, endometriosis, arthritis, restless leg syndrome, tobacco abuse, anxiety, panic disorder, OCD, depression, obesity.  The history is provided by the patient and medical records.    Past Medical History:  Diagnosis Date  . Anal fissure 1994  . Anxiety   . Arthritis    Dr. Penni Bombard at  [facility]  . Depression   . Endometriosis   . Fracture 08/2011   fracture left wrist and elbow  . Hemorrhoids 2008  . Hepatomegaly   . Hypothyroidism   . Mixed hyperlipidemia   . Obesity   . OCD (obsessive compulsive disorder)   . Panic disorder   . Plantar fasciitis, right 03/2014  . Prediabetes 01/16/2015  . RLS (restless legs syndrome) 2014   vis sleep study- no sleep apnea  . Shingles 07/2017  . Tobacco abuse    not tolerated Chantix in the past  . Vitamin D deficiency     Patient Active Problem List   Diagnosis Date Noted  . Abnormal glucose 11/24/2018  . Labile hypertension 03/05/2018  . Tobacco use disorder 01/16/2015  . TMJ (dislocation of temporomandibular joint) 01/16/2015  . Vitamin D deficiency   . Hepatomegaly   . Mixed hyperlipidemia   . Panic disorder   . Anal fissure   . Depression, major, in remission (HCC)   . Hypothyroidism   . Obesity     Past Surgical History:   Procedure Laterality Date  . CHOLECYSTECTOMY    . HEMORROIDECTOMY    . LEFT OOPHORECTOMY    . TONSILLECTOMY      OB History    Gravida  1   Para  1   Term      Preterm      AB      Living  1     SAB      IAB      Ectopic      Multiple      Live Births               Home Medications    Prior to Admission medications   Medication Sig Start Date End Date Taking? Authorizing Provider  ibuprofen (ADVIL) 600 MG tablet Take 1 tablet (600 mg total) by mouth every 6 (six) hours as needed. 01/18/21  Yes Mickie Bail, NP  methocarbamol (ROBAXIN) 500 MG tablet Take 1 tablet (500 mg total) by mouth 2 (two) times daily as needed for muscle spasms. 01/18/21  Yes Mickie Bail, NP  ARIPiprazole (ABILIFY) 5 MG tablet Take 1 tablet (5 mg total) by mouth daily. 08/08/20   Cottle, Steva Ready., MD  azithromycin (ZITHROMAX) 250 MG tablet Take 2 tablets with Food on  Day 1, then 1 tablet Daily with Food for Infection 11/21/19   Lucky Cowboy, MD  Cetirizine  HCl (ZYRTEC ALLERGY) 10 MG CAPS Take by mouth.    [provider]  Cholecalciferol (VITAMIN D3) 1.25 MG (50000 UT) CAPS Take 1 capsule by mouth 2 (two) times a week.     [provider]  clonazePAM (KLONOPIN) 1 MG tablet TAKE 1/2 TABLET 3 TIMES DAILY,1 TABLET AT BEDTIME AND 1/2 TABLET DAILY AS NEEDED.  Keep MD appt 11/05/20   Lauraine Rinne., MD  dexamethasone (DECADRON) 4 MG tablet Take 1 tab 3 x day - 3 days, then 2 x day - 3 days, then 1 tab daily 11/21/19   Lucky Cowboy, MD  EUTHYROX 88 MCG tablet TAKE 1 TABLET BY MOUTH ONCE DAILY BEFORE BREAKFAST 06/25/20   Jerene Bears, MD  gabapentin (NEURONTIN) 100 MG capsule 1-3 capsules as needed for restless legs Patient not taking: Reported on 08/08/2020 01/26/19   Cottle, Steva Ready., MD  mometasone (NASONEX) 50 MCG/ACT nasal spray Place 2 sprays into the nose daily.    [provider]  pravastatin (PRAVACHOL) 40 MG tablet Take    1 tablet    at Bedtime     for Cholesterol 10/26/20   Lucky Cowboy, MD  Pseudoephedrine-guaiFENesin [facility] D PO) Take by mouth.    [provider]  sertraline (ZOLOFT) 100 MG tablet Take 2.5 tablets (250 mg total) by mouth daily. 08/08/20   Cottle, Steva Ready., MD  sodium chloride (OCEAN) 0.65 % SOLN nasal spray Place 1 spray into both nostrils as needed for congestion.    [provider]  Vitamin D, Ergocalciferol, (DRISDOL) 1.25 MG (50000 UNIT) CAPS capsule Take 1 capsule (50,000 Units total) by mouth 2 (two) times a week. 06/14/20   Jerene Bears, MD    Family History Family History  Problem Relation Age of Onset  . Breast cancer Mother   . Diabetes Mother   . Drug abuse Brother        and alcohol  . Cirrhosis Father        alcohol  . Heart disease Father        MI  . Colon cancer Maternal Grandmother 50  . Alcohol abuse Sister   . Breast cancer Cousin   . Thyroid disease Neg Hx     Social History Social History   Tobacco Use  . Smoking status: Current Every Day Smoker    Packs/day: 1.00    Years: 31.00    Pack years: 31.00    Types: Cigarettes    Start date: 32  . Smokeless tobacco: Never Used  Vaping Use  . Vaping Use: Never used  Substance Use Topics  . Alcohol use: Yes    Alcohol/week: 0.0 standard drinks    Comment: occ glass of wine  . Drug use: No     Allergies   Patient has no known allergies.   Review of Systems Review of Systems  Constitutional: Negative for chills and fever.  HENT: Negative for ear pain and sore throat.   Eyes: Negative for pain and visual disturbance.  Respiratory: Negative for cough and shortness of breath.   Cardiovascular: Negative for chest pain and palpitations.  Gastrointestinal: Negative for abdominal pain and vomiting.  Genitourinary: Negative for dysuria and hematuria.  Musculoskeletal: Positive for back pain. Negative for arthralgias.  Skin: Negative for color change and rash.  Neurological: Negative for syncope,  weakness and numbness.  All other systems reviewed and are negative.    Physical Exam Triage Vital Signs ED Triage Vitals  Enc  Vitals Group     BP      Pulse      Resp      Temp      Temp src      SpO2      Weight      Height      Head Circumference      Peak Flow      Pain Score      Pain Loc      Pain Edu?      Excl. in GC?    No data found.  Updated Vital Signs BP 116/79 (BP Location: Left Arm)   Pulse 86   Temp 97.9 F (36.6 C) (Oral)   Resp 20   LMP 08/04/2012   SpO2 96%   Visual Acuity Right Eye Distance:   Left Eye Distance:   Bilateral Distance:    Right Eye Near:   Left Eye Near:    Bilateral Near:     Physical Exam Vitals and nursing note reviewed.  Constitutional:      General: She is not in acute distress.    Appearance: She is well-developed.  HENT:     Head: Normocephalic and atraumatic.     Mouth/Throat:     Mouth: Mucous membranes are moist.  Eyes:     Conjunctiva/sclera: Conjunctivae normal.  Cardiovascular:     Rate and Rhythm: Normal rate and regular rhythm.     Heart sounds: Normal heart sounds.  Pulmonary:     Effort: Pulmonary effort is normal. No respiratory distress.     Breath sounds: Normal breath sounds.  Abdominal:     Palpations: Abdomen is soft.     Tenderness: There is no abdominal tenderness. There is no right CVA tenderness, left CVA tenderness, guarding or rebound.  Musculoskeletal:        General: No swelling, tenderness or deformity. Normal range of motion.     Cervical back: Neck supple.  Skin:    General: Skin is warm and dry.     Capillary Refill: Capillary refill takes less than 2 seconds.     Findings: No bruising, erythema, lesion or rash.  Neurological:     General: No focal deficit present.     Mental Status: She is alert and oriented to person, place, and time.     Sensory: No sensory deficit.     Motor: No weakness.     Gait: Gait normal.     Comments: Straight leg raise negative.     Psychiatric:        Mood and Affect: Mood normal.        Behavior: Behavior normal.      UC Treatments / Results  Labs (all labs ordered are listed, but only abnormal results are displayed) Labs Reviewed - No data to display  EKG   Radiology No results found.  Procedures Procedures (including critical care time)  Medications Ordered in UC Medications - No data to display  Initial Impression / Assessment and Plan / UC Course  I have reviewed the triage vital signs and the nursing notes.  Pertinent labs & imaging results that were available during my care of the patient were reviewed by me and considered in my medical decision making (see chart for details).   Acute left lower back pain with left-sided sciatica.  Treating with ibuprofen and Robaxin.  Precautions for drowsiness with Robaxin discussed.  Instructed patient to follow-up with her PCP or an orthopedist if her symptoms are  not improving.  She agrees to plan of care.   Final Clinical Impressions(s) / UC Diagnoses   Final diagnoses:  Acute left-sided low back pain with left-sided sciatica     Discharge Instructions     Take ibuprofen as needed for discomfort.  Take the muscle relaxer as needed for muscle spasm; Do not drive, operate machinery, or drink alcohol with this medication as it can cause drowsiness.   Follow up with your primary care provider or an orthopedist if your symptoms are not improving.        ED Prescriptions    Medication Sig Dispense Auth. Provider   ibuprofen (ADVIL) 600 MG tablet Take 1 tablet (600 mg total) by mouth every 6 (six) hours as needed. 30 tablet Mickie Bail, NP   methocarbamol (ROBAXIN) 500 MG tablet Take 1 tablet (500 mg total) by mouth 2 (two) times daily as needed for muscle spasms. 10 tablet Mickie Bail, NP     PDMP not reviewed this encounter.   Mickie Bail, NP 01/18/21 1954

## 2021-01-18 NOTE — Discharge Instructions (Signed)
Take ibuprofen as needed for discomfort.  Take the muscle relaxer as needed for muscle spasm; Do not drive, operate machinery, or drink alcohol with this medication as it can cause drowsiness.   Follow up with your primary care provider or an orthopedist if your symptoms are not improving.     

## 2021-01-18 NOTE — ED Triage Notes (Signed)
Pt c/o lower back pain radiating down lt leg since this am. No known injury.

## 2021-01-21 NOTE — Telephone Encounter (Signed)
Please schedule appt

## 2021-01-22 ENCOUNTER — Encounter (HOSPITAL_BASED_OUTPATIENT_CLINIC_OR_DEPARTMENT_OTHER): Payer: Self-pay

## 2021-01-24 NOTE — Telephone Encounter (Signed)
She has an appt 4/18. Please review for refill.

## 2021-01-26 ENCOUNTER — Other Ambulatory Visit: Payer: Self-pay | Admitting: Internal Medicine

## 2021-01-28 ENCOUNTER — Ambulatory Visit: Payer: Medicare Other | Admitting: Psychiatry

## 2021-02-01 DIAGNOSIS — M25552 Pain in left hip: Secondary | ICD-10-CM | POA: Diagnosis not present

## 2021-02-01 DIAGNOSIS — M47816 Spondylosis without myelopathy or radiculopathy, lumbar region: Secondary | ICD-10-CM | POA: Diagnosis not present

## 2021-02-11 ENCOUNTER — Encounter (HOSPITAL_BASED_OUTPATIENT_CLINIC_OR_DEPARTMENT_OTHER): Payer: Self-pay

## 2021-02-20 ENCOUNTER — Ambulatory Visit (INDEPENDENT_AMBULATORY_CARE_PROVIDER_SITE_OTHER): Payer: Medicare Other | Admitting: Adult Health

## 2021-02-20 ENCOUNTER — Other Ambulatory Visit: Payer: Self-pay

## 2021-02-20 ENCOUNTER — Encounter: Payer: Self-pay | Admitting: Adult Health

## 2021-02-20 VITALS — BP 122/74 | HR 99 | Temp 96.8°F | Wt 227.0 lb

## 2021-02-20 DIAGNOSIS — E559 Vitamin D deficiency, unspecified: Secondary | ICD-10-CM | POA: Diagnosis not present

## 2021-02-20 DIAGNOSIS — R0989 Other specified symptoms and signs involving the circulatory and respiratory systems: Secondary | ICD-10-CM

## 2021-02-20 DIAGNOSIS — E039 Hypothyroidism, unspecified: Secondary | ICD-10-CM | POA: Diagnosis not present

## 2021-02-20 DIAGNOSIS — R7309 Other abnormal glucose: Secondary | ICD-10-CM

## 2021-02-20 DIAGNOSIS — R002 Palpitations: Secondary | ICD-10-CM | POA: Diagnosis not present

## 2021-02-20 DIAGNOSIS — E782 Mixed hyperlipidemia: Secondary | ICD-10-CM | POA: Diagnosis not present

## 2021-02-20 NOTE — Patient Instructions (Signed)
Recommend cutting down on smoking, caffeine slowly   Monitor palpitations - keep a log of when, how long, how fast or anything else unusual and let me know   Start slow and gradual with exercise, frequent breaks as needed      Palpitations Palpitations are feelings that your heartbeat is irregular or is faster than normal. It may feel like your heart is fluttering or skipping a beat. Palpitations are usually not a serious problem. They may be caused by many things, including smoking, caffeine, alcohol, stress, and certain medicines or drugs. Most causes of palpitations are not serious. However, some palpitations can be a sign of a serious problem. You may need further tests to rule out serious medical problems. Follow these instructions at home: Pay attention to any changes in your condition. Take these actions to help manage your symptoms: Eating and drinking  Avoid foods and drinks that may cause palpitations. These may include: ? Caffeinated coffee, tea, soft drinks, diet pills, and energy drinks. ? Chocolate. ? Alcohol. Lifestyle  Take steps to reduce your stress and anxiety. Things that can help you relax include: ? Yoga. ? Mind-body activities, such as deep breathing, meditation, or using words and images to create positive thoughts (guided imagery). ? Physical activity, such as swimming, jogging, or walking. Tell your health care provider if your palpitations increase with activity. If you have chest pain or shortness of breath with activity, do not continue the activity until you are seen by your health care provider. ? Biofeedback. This is a method that helps you learn to use your mind to control things in your body, such as your heartbeat.  Do not use drugs, including cocaine or ecstasy. Do not use marijuana.  Get plenty of rest and sleep. Keep a regular bed time. General instructions  Take over-the-counter and prescription medicines only as told by your health care  provider.  Do not use any products that contain nicotine or tobacco, such as cigarettes and e-cigarettes. If you need help quitting, ask your health care provider.  Keep all follow-up visits as told by your health care provider. This is important. These may include visits for further testing if palpitations do not go away or get worse.      Contact a health care provider if you:  Continue to have a fast or irregular heartbeat after 24 hours.  Notice that your palpitations occur more often. Get help right away if you:  Have chest pain or shortness of breath.  Have a severe headache.  Feel dizzy or you faint. Summary  Palpitations are feelings that your heartbeat is irregular or is faster than normal. It may feel like your heart is fluttering or skipping a beat.  Palpitations may be caused by many things, including smoking, caffeine, alcohol, stress, certain medicines, and drugs.  Although most causes of palpitations are not serious, some causes can be a sign of a serious medical problem.  Get help right away if you faint or have chest pain, shortness of breath, a severe headache, or dizziness. This information is not intended to replace advice given to you by your health care provider. Make sure you discuss any questions you have with your health care provider. Document Revised: 11/11/2017 Document Reviewed: 11/11/2017 Elsevier Patient Education  2021 [employer].

## 2021-02-20 NOTE — Progress Notes (Signed)
ACUTE VISIT  Assessment:    Palpitations Normal exam, EKG today Check CBC, CMP, magnesium, TSH, UA Reduce tobacco, caffeine If recurrent keep log and contact office, will send for holter Go to the ER if any chest pain, shortness of breath, nausea, dizziness, severe HA, changes vision/speech   Hypothyroidism, unspecified hypothyroidism type -check TSH level, continue medications the same, reminded to take on an empty stomach 30-77mins before food.  - TSH  Mixed hyperlipidemia -continue medications, check lipids, decrease fatty foods, increase activity. 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    Other abnormal glucose Discussed disease and risks Discussed diet/exercise, weight management  - Hemoglobin A1c   Over 40 minutes of exam, counseling, chart review and critical decision making was performed  Future Appointments  Date Time Provider Department Center  03/27/2021  2:30 PM Cottle, Steva Ready., MD [facility] None    Subjective:  Susan Bartlett is a 55 y.o. presents for acute visit, concern with increased pulse, palpitations, last seen in office in 2020, poor follow up. She has Hepatomegaly; Mixed hyperlipidemia; Panic disorder; Anal fissure; Depression, major, in remission (HCC); Hypothyroidism; Obesity; Vitamin D deficiency; Tobacco use disorder; TMJ (dislocation of temporomandibular joint); Labile hypertension; and Abnormal glucose on their problem list.   She reports has been very sedentary (barely getting out of bed) from 12/2019 after her niece  passed until 12/20/2020, 1 year due to severe depression (follows with Dr. Jennelle Human), has been working to increase activity by getting out and walk more, generally get up and move more in the home in the last few weeks.  She reports 3 days ago she had an episode of sudden onset palpitations, fast heart beat rather than skipping, wouldn't resolve, different from panic attack, lasted for over 1 hour, fell asleep and was resolved when she woke up. She had similar episode yesterday, tried to do deep breathing and calming exercises that typically work well, was having trouble catching breath, lasted 15-20 min. Has fitbit showing was in 110-120s for some time.   She is a smoker, smoking 1.5 packs/day, since she was 55 years old,   BMI is Body mass index is 35.55 kg/m., she has not been working on diet and exercise except walking some laps around house in recent weeks.  She also drinks diet mountain dew, 12 pack diet, does 1/2 caffeine free  Wt Readings from Last 3 Encounters:  02/20/21 227 lb (103 kg)  11/21/19 225 lb 6.4 oz (102.2 kg)  10/17/19 222 lb 9.6 oz (101 kg)   Today their BP is BP: 122/74.   She is on cholesterol medication, pravastatin 40 and denies myalgias. Her cholesterol is not at goal. The cholesterol last visit was:   Lab Results  Component Value Date   CHOL 235 (H) 10/17/2019   HDL 47 10/17/2019   LDLCALC 144 (H) 10/17/2019   TRIG 243 (H) 10/17/2019   CHOLHDL 5.0 (H) 10/17/2019   Last A1C in the office was:  Lab Results  Component Value Date   HGBA1C 5.3 10/17/2019   Patient is not currently on Vitamin D supplement, was taking  50000 IU 1-2 days per week.   Lab Results  Component Value Date   VD25OH 6.3 (L) 05/24/2020   She is on thyroid medication, she is on 88 mcg 1 tab daily, on empty stomach, was taking irregularly, taking daily in the last 2 weeks.  Lab Results  Component Value Date   TSH 8.060 (H) 05/24/2020     Current Outpatient Medications  Medication  Instructions  . Cetirizine HCl 10 MG CAPS Oral  . Cholecalciferol (VITAMIN D3) 1.25 MG (50000 UT) CAPS 1 capsule, 2 times weekly  . clonazePAM (KLONOPIN) 1 MG tablet TAKE 1/2 TABLET 3 TIMES DAILY,1 TABLET AT BEDTIME AND 1/2 TABLET DAILY AS NEEDED.  Keep MD appt  . gabapentin (NEURONTIN) 100 MG capsule TAKE 1 TO 3 CAPSULES BY MOUTH AS NEEDED FOR  RESTLESS  LEGS  . ibuprofen (ADVIL) 600 mg, Oral, Every 6 hours PRN  . levothyroxine (SYNTHROID) 88 MCG tablet TAKE 1 TABLET BY MOUTH ONCE DAILY BEFORE BREAKFAST  . methocarbamol (ROBAXIN) 500 mg, Oral, 2 times daily PRN  . mometasone (NASONEX) 50 MCG/ACT nasal spray 2 sprays, Nasal, Daily  . pravastatin (PRAVACHOL) 40 MG tablet Take  1 tablet  at Bedtime  for Cholesterol  . Pseudoephedrine-guaiFENesin (MUCINEX D PO) Take by mouth.  . sertraline (ZOLOFT) 250 mg, Oral, Daily  . sodium chloride (OCEAN) 0.65 % SOLN nasal spray 1 spray, Each Nare, As needed  . Vitamin D (Ergocalciferol) (DRISDOL) 50,000 Units, Oral, 2 times weekly     Current Problems (verified) Patient Active Problem List   Diagnosis Date Noted  . Abnormal glucose 11/24/2018  . Labile hypertension 03/05/2018  . Tobacco use disorder 01/16/2015  . TMJ (dislocation of temporomandibular joint) 01/16/2015  . Vitamin D deficiency   . Hepatomegaly   . Mixed hyperlipidemia   . Panic disorder   . Anal fissure   . Depression, major, in remission (HCC)   . Hypothyroidism   . Obesity     Allergies No Known Allergies  SURGICAL HISTORY She  has a past surgical history that includes Hemorroidectomy; Cholecystectomy; Left oophorectomy; and Tonsillectomy. FAMILY HISTORY Her family history includes Alcohol abuse in her sister; Breast cancer in her cousin and mother; Cirrhosis in her father; Colon cancer (age of onset: 10) in her maternal grandmother; Diabetes in her mother; Drug abuse in her brother; Heart disease in her father. SOCIAL HISTORY She  reports that she has been smoking  cigarettes. She started smoking about 32 years ago. She has a 32.00 pack-year smoking history. She has never used smokeless tobacco. She reports current alcohol use. She reports that she does not use drugs.   Review of Systems  Constitutional: Negative for chills, fever, malaise/fatigue and weight loss.  HENT: Negative for congestion, ear pain, hearing loss, sore throat and tinnitus.   Eyes: Negative for blurred vision and double vision.  Respiratory: Positive for shortness of breath. Negative for cough, sputum production and wheezing.   Cardiovascular: Positive for palpitations. Negative for chest pain, orthopnea, claudication and leg swelling.  Gastrointestinal: Negative for abdominal pain, blood in stool, constipation, diarrhea, heartburn, melena, nausea and vomiting.  Genitourinary: Negative.   Musculoskeletal: Negative for joint pain and myalgias.  Skin: Negative for rash.  Neurological: Negative for dizziness, tingling, sensory change, weakness and headaches.  Endo/Heme/Allergies: Negative for polydipsia.  Psychiatric/Behavioral: Positive for depression. Negative for hallucinations, substance abuse and suicidal ideas. The patient is nervous/anxious. The patient does not have insomnia.   All other systems reviewed and are negative.  Objective:     Blood pressure 122/74, pulse 99, temperature (!) 96.8 F (36 C), weight 227 lb (103 kg), last menstrual period 08/04/2012, SpO2 98 %. Body mass index is 35.55 kg/m.  General appearance: alert, no distress, WD/WN, female HEENT: normocephalic, sclerae anicteric, TMs pearly, nares patent, no discharge or erythema, pharynx normal Oral cavity: MMM, no lesions Neck: supple, no lymphadenopathy, no thyromegaly, no masses Heart: RRR, normal S1, S2, no murmurs Lungs: CTA bilaterally, no wheezes, rhonchi, or rales Abdomen: +bs, soft, non tender, non distended, no masses, no hepatomegaly, no splenomegaly Musculoskeletal: nontender, no swelling,  no obvious deformity Extremities: no edema, no cyanosis, no clubbing Pulses: 2+ symmetric, upper and lower extremities, normal cap refill Neurological: alert, oriented x 3, CN2-12 intact, strength normal upper extremities and lower extremities, sensation normal throughout, normal gait Psychiatric: mildly anxious affect, behavior normal, pleasant     Dan Maker, NP   02/20/2021

## 2021-02-21 ENCOUNTER — Other Ambulatory Visit: Payer: Self-pay | Admitting: Adult Health

## 2021-02-21 DIAGNOSIS — M47816 Spondylosis without myelopathy or radiculopathy, lumbar region: Secondary | ICD-10-CM | POA: Diagnosis not present

## 2021-02-21 DIAGNOSIS — M545 Low back pain, unspecified: Secondary | ICD-10-CM | POA: Diagnosis not present

## 2021-02-21 DIAGNOSIS — R7989 Other specified abnormal findings of blood chemistry: Secondary | ICD-10-CM

## 2021-02-21 LAB — URINALYSIS, ROUTINE W REFLEX MICROSCOPIC
Bilirubin Urine: NEGATIVE
Glucose, UA: NEGATIVE
Hgb urine dipstick: NEGATIVE
Ketones, ur: NEGATIVE
Leukocytes,Ua: NEGATIVE
Nitrite: NEGATIVE
Protein, ur: NEGATIVE
Specific Gravity, Urine: 1.005 (ref 1.001–1.035)
pH: 5.5 (ref 5.0–8.0)

## 2021-02-21 LAB — COMPLETE METABOLIC PANEL WITH GFR
AG Ratio: 1.9 (calc) (ref 1.0–2.5)
ALT: 16 U/L (ref 6–29)
AST: 15 U/L (ref 10–35)
Albumin: 4.3 g/dL (ref 3.6–5.1)
Alkaline phosphatase (APISO): 79 U/L (ref 37–153)
BUN: 7 mg/dL (ref 7–25)
CO2: 26 mmol/L (ref 20–32)
Calcium: 9.6 mg/dL (ref 8.6–10.4)
Chloride: 100 mmol/L (ref 98–110)
Creat: 0.8 mg/dL (ref 0.50–1.05)
GFR, Est African American: 96 mL/min/{1.73_m2} (ref >=60)
GFR, Est Non African American: 82 mL/min/{1.73_m2} (ref >=60)
Globulin: 2.3 g/dL (calc) (ref 1.9–3.7)
Glucose, Bld: 82 mg/dL (ref 65–99)
Potassium: 4.4 mmol/L (ref 3.5–5.3)
Sodium: 133 mmol/L — ABNORMAL LOW (ref 135–146)
Total Bilirubin: 0.4 mg/dL (ref 0.2–1.2)
Total Protein: 6.6 g/dL (ref 6.1–8.1)

## 2021-02-21 LAB — LIPID PANEL
Cholesterol: 197 mg/dL (ref <200)
HDL: 46 mg/dL — ABNORMAL LOW (ref >=50)
LDL Cholesterol (Calc): 116 mg/dL (calc) — ABNORMAL HIGH
Non-HDL Cholesterol (Calc): 151 mg/dL (calc) — ABNORMAL HIGH (ref <130)
Total CHOL/HDL Ratio: 4.3 (calc) (ref <5.0)
Triglycerides: 232 mg/dL — ABNORMAL HIGH (ref <150)

## 2021-02-21 LAB — CBC WITH DIFFERENTIAL/PLATELET
Absolute Monocytes: 643 cells/uL (ref 200–950)
Basophils Absolute: 67 cells/uL (ref 0–200)
Basophils Relative: 0.7 %
Eosinophils Absolute: 202 cells/uL (ref 15–500)
Eosinophils Relative: 2.1 %
HCT: 40.5 % (ref 35.0–45.0)
Hemoglobin: 13.8 g/dL (ref 11.7–15.5)
Lymphs Abs: 2246 cells/uL (ref 850–3900)
MCH: 30.9 pg (ref 27.0–33.0)
MCHC: 34.1 g/dL (ref 32.0–36.0)
MCV: 90.6 fL (ref 80.0–100.0)
MPV: 10.5 fL (ref 7.5–12.5)
Monocytes Relative: 6.7 %
Neutro Abs: 6442 cells/uL (ref 1500–7800)
Neutrophils Relative %: 67.1 %
Platelets: 309 10*3/uL (ref 140–400)
RBC: 4.47 10*6/uL (ref 3.80–5.10)
RDW: 13.3 % (ref 11.0–15.0)
Total Lymphocyte: 23.4 %
WBC: 9.6 10*3/uL (ref 3.8–10.8)

## 2021-02-21 LAB — MAGNESIUM: Magnesium: 1.9 mg/dL (ref 1.5–2.5)

## 2021-02-21 LAB — HEMOGLOBIN A1C
Hgb A1c MFr Bld: 5.2 % of total Hgb (ref <5.7)
Mean Plasma Glucose: 103 mg/dL
eAG (mmol/L): 5.7 mmol/L

## 2021-02-21 LAB — TSH: TSH: 6.44 mIU/L — ABNORMAL HIGH (ref 0.40–4.50)

## 2021-02-21 LAB — VITAMIN D 25 HYDROXY (VIT D DEFICIENCY, FRACTURES): Vit D, 25-Hydroxy: 21 ng/mL — ABNORMAL LOW (ref 30–100)

## 2021-02-21 MED ORDER — LEVOTHYROXINE SODIUM 88 MCG PO TABS: ORAL_TABLET | ORAL | 0 refills | Status: DC

## 2021-02-23 ENCOUNTER — Other Ambulatory Visit: Payer: Self-pay | Admitting: Adult Health

## 2021-02-23 DIAGNOSIS — J340 Abscess, furuncle and carbuncle of nose: Secondary | ICD-10-CM

## 2021-02-23 MED ORDER — DOXYCYCLINE HYCLATE 100 MG PO CAPS: ORAL_CAPSULE | ORAL | 0 refills | Status: DC

## 2021-03-11 ENCOUNTER — Other Ambulatory Visit: Payer: Self-pay | Admitting: Psychiatry

## 2021-03-11 DIAGNOSIS — F4001 Agoraphobia with panic disorder: Secondary | ICD-10-CM

## 2021-03-11 DIAGNOSIS — F411 Generalized anxiety disorder: Secondary | ICD-10-CM

## 2021-03-25 ENCOUNTER — Other Ambulatory Visit: Payer: Self-pay | Admitting: Psychiatry

## 2021-03-25 DIAGNOSIS — F4001 Agoraphobia with panic disorder: Secondary | ICD-10-CM

## 2021-03-25 DIAGNOSIS — F411 Generalized anxiety disorder: Secondary | ICD-10-CM

## 2021-03-25 DIAGNOSIS — F422 Mixed obsessional thoughts and acts: Secondary | ICD-10-CM

## 2021-03-27 ENCOUNTER — Telehealth (INDEPENDENT_AMBULATORY_CARE_PROVIDER_SITE_OTHER): Payer: Medicare Other | Admitting: Psychiatry

## 2021-03-27 ENCOUNTER — Encounter: Payer: Self-pay | Admitting: Psychiatry

## 2021-03-27 DIAGNOSIS — F4001 Agoraphobia with panic disorder: Secondary | ICD-10-CM | POA: Diagnosis not present

## 2021-03-27 DIAGNOSIS — F331 Major depressive disorder, recurrent, moderate: Secondary | ICD-10-CM | POA: Diagnosis not present

## 2021-03-27 DIAGNOSIS — G2581 Restless legs syndrome: Secondary | ICD-10-CM

## 2021-03-27 DIAGNOSIS — F422 Mixed obsessional thoughts and acts: Secondary | ICD-10-CM | POA: Diagnosis not present

## 2021-03-27 DIAGNOSIS — R7989 Other specified abnormal findings of blood chemistry: Secondary | ICD-10-CM

## 2021-03-27 DIAGNOSIS — F4321 Adjustment disorder with depressed mood: Secondary | ICD-10-CM | POA: Diagnosis not present

## 2021-03-27 DIAGNOSIS — F411 Generalized anxiety disorder: Secondary | ICD-10-CM

## 2021-03-27 DIAGNOSIS — F5105 Insomnia due to other mental disorder: Secondary | ICD-10-CM

## 2021-03-27 MED ORDER — BUPROPION HCL ER (XL) 150 MG PO TB24: 150.0000 mg | ORAL_TABLET | Freq: Every day | ORAL | 1 refills | Status: DC

## 2021-03-27 MED ORDER — SERTRALINE HCL 100 MG PO TABS: 250.0000 mg | ORAL_TABLET | Freq: Every day | ORAL | 1 refills | Status: DC

## 2021-03-27 MED ORDER — CLONAZEPAM 1 MG PO TABS: ORAL_TABLET | ORAL | 2 refills | Status: DC
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
## 2021-04-10 ENCOUNTER — Encounter: Payer: Medicare Other | Admitting: Physician Assistant

## 2021-05-01 ENCOUNTER — Other Ambulatory Visit: Payer: Self-pay | Admitting: Obstetrics & Gynecology

## 2021-05-01 DIAGNOSIS — R7989 Other specified abnormal findings of blood chemistry: Secondary | ICD-10-CM

## 2021-05-07 ENCOUNTER — Other Ambulatory Visit (HOSPITAL_BASED_OUTPATIENT_CLINIC_OR_DEPARTMENT_OTHER): Payer: Self-pay | Admitting: Obstetrics & Gynecology

## 2021-05-07 DIAGNOSIS — Z1231 Encounter for screening mammogram for malignant neoplasm of breast: Secondary | ICD-10-CM

## 2021-05-09 ENCOUNTER — Emergency Department (HOSPITAL_BASED_OUTPATIENT_CLINIC_OR_DEPARTMENT_OTHER): Payer: Medicare Other | Admitting: Radiology

## 2021-05-09 ENCOUNTER — Encounter (HOSPITAL_BASED_OUTPATIENT_CLINIC_OR_DEPARTMENT_OTHER): Payer: Self-pay

## 2021-05-09 ENCOUNTER — Emergency Department (HOSPITAL_BASED_OUTPATIENT_CLINIC_OR_DEPARTMENT_OTHER)
Admission: EM | Admit: 2021-05-09 | Discharge: 2021-05-10 | Disposition: A | Discharge status: Home / Self Care | Payer: Medicare Other | Attending: Emergency Medicine | Admitting: Emergency Medicine

## 2021-05-09 ENCOUNTER — Other Ambulatory Visit: Payer: Self-pay

## 2021-05-09 DIAGNOSIS — R61 Generalized hyperhidrosis: Secondary | ICD-10-CM | POA: Insufficient documentation

## 2021-05-09 DIAGNOSIS — R42 Dizziness and giddiness: Secondary | ICD-10-CM | POA: Diagnosis not present

## 2021-05-09 DIAGNOSIS — R079 Chest pain, unspecified: Secondary | ICD-10-CM | POA: Diagnosis not present

## 2021-05-09 DIAGNOSIS — I10 Essential (primary) hypertension: Secondary | ICD-10-CM | POA: Diagnosis not present

## 2021-05-09 DIAGNOSIS — Z79899 Other long term (current) drug therapy: Secondary | ICD-10-CM | POA: Diagnosis not present

## 2021-05-09 DIAGNOSIS — F17210 Nicotine dependence, cigarettes, uncomplicated: Secondary | ICD-10-CM | POA: Insufficient documentation

## 2021-05-09 DIAGNOSIS — E039 Hypothyroidism, unspecified: Secondary | ICD-10-CM | POA: Diagnosis not present

## 2021-05-09 DIAGNOSIS — R000 Tachycardia, unspecified: Secondary | ICD-10-CM | POA: Diagnosis not present

## 2021-05-09 DIAGNOSIS — R0789 Other chest pain: Secondary | ICD-10-CM | POA: Insufficient documentation

## 2021-05-09 DIAGNOSIS — R0602 Shortness of breath: Secondary | ICD-10-CM | POA: Insufficient documentation

## 2021-05-09 DIAGNOSIS — R110 Nausea: Secondary | ICD-10-CM | POA: Diagnosis not present

## 2021-05-09 LAB — CBC
HCT: 38.7 % (ref 36.0–46.0)
Hemoglobin: 13 g/dL (ref 12.0–15.0)
MCH: 30.7 pg (ref 26.0–34.0)
MCHC: 33.6 g/dL (ref 30.0–36.0)
MCV: 91.3 fL (ref 80.0–100.0)
Platelets: 252 10*3/uL (ref 150–400)
RBC: 4.24 MIL/uL (ref 3.87–5.11)
RDW: 13.8 % (ref 11.5–15.5)
WBC: 8.7 10*3/uL (ref 4.0–10.5)
nRBC: 0 % (ref 0.0–0.2)

## 2021-05-09 LAB — BASIC METABOLIC PANEL
Anion gap: 10 (ref 5–15)
BUN: 10 mg/dL (ref 6–20)
CO2: 23 mmol/L (ref 22–32)
Calcium: 9.9 mg/dL (ref 8.9–10.3)
Chloride: 102 mmol/L (ref 98–111)
Creatinine, Ser: 0.69 mg/dL (ref 0.44–1.00)
GFR, Estimated: >60 mL/min (ref >60)
Glucose, Bld: 91 mg/dL (ref 70–99)
Potassium: 4 mmol/L (ref 3.5–5.1)
Sodium: 135 mmol/L (ref 135–145)

## 2021-05-09 LAB — TROPONIN I (HIGH SENSITIVITY): Troponin I (High Sensitivity): 3 ng/L (ref <18)

## 2021-05-09 NOTE — ED Triage Notes (Signed)
Pt reports central  chest pain and  described dull pain -associated with SOB - onset 3pm today  PMX -  smoker - HLD

## 2021-05-10 ENCOUNTER — Encounter (HOSPITAL_BASED_OUTPATIENT_CLINIC_OR_DEPARTMENT_OTHER): Payer: Self-pay | Admitting: Radiology

## 2021-05-10 ENCOUNTER — Emergency Department (HOSPITAL_BASED_OUTPATIENT_CLINIC_OR_DEPARTMENT_OTHER): Payer: Medicare Other

## 2021-05-10 DIAGNOSIS — R0789 Other chest pain: Secondary | ICD-10-CM | POA: Diagnosis not present

## 2021-05-10 DIAGNOSIS — R079 Chest pain, unspecified: Secondary | ICD-10-CM | POA: Diagnosis not present

## 2021-05-10 LAB — TROPONIN I (HIGH SENSITIVITY): Troponin I (High Sensitivity): 3 ng/L (ref <18)

## 2021-05-10 LAB — PREGNANCY, URINE: Preg Test, Ur: NEGATIVE

## 2021-05-10 MED ORDER — IOHEXOL 350 MG/ML SOLN
100.0000 mL | Freq: Once | INTRAVENOUS | Status: AC | PRN
  Administered 2021-05-10: 100 mL via INTRAVENOUS

## 2021-05-10 MED ORDER — PANTOPRAZOLE SODIUM 40 MG PO TBEC: 40.0000 mg | DELAYED_RELEASE_TABLET | Freq: Every day | ORAL | 3 refills | Status: DC

## 2021-05-10 NOTE — ED Provider Notes (Signed)
[facility] EMERGENCY DEPT Provider Note   CSN: [id number] Arrival date & time: 05/09/21  2019     History Chief Complaint  Patient presents with   Chest Pain    Susan Bartlett is a 55 y.o. female.  Patient presents to the emergency department for evaluation of chest pain.  Patient reports that the pain began around 3 PM today.  She was eating when the pain started.  Pain has been dull, aching in the center of her chest ever since.  This evening she started having pain radiating through to the back.  She has had some mild shortness of breath.  Pain is slightly related to bending of the torso, no known injury.   HPI: A 55 year old patient with a history of hypercholesterolemia and obesity presents for evaluation of chest pain. Initial onset of pain was more than 6 hours ago. The patient's chest pain is well-localized, is sharp and is not worse with exertion. The patient reports some diaphoresis. The patient's chest pain is middle- or left-sided, is not described as heaviness/pressure/tightness and does not radiate to the arms/jaw/neck. The patient does not complain of nausea. The patient has smoked in the past 90 days. The patient has no history of stroke, has no history of peripheral artery disease, denies any history of treated diabetes, has no relevant family history of coronary artery disease (first degree relative at less than age 54) and is not hypertensive.   Past Medical History:  Diagnosis Date   Anal fissure 1994   Anxiety    Arthritis    Dr. Penni Bombard at [facility]   Depression    Endometriosis    Fracture 08/2011   fracture left wrist and elbow   Hemorrhoids 2008   Hepatomegaly    Hypothyroidism    Mixed hyperlipidemia    Obesity    OCD (obsessive compulsive disorder)    Panic disorder    Plantar fasciitis, right 03/2014   Prediabetes 01/16/2015   RLS (restless legs syndrome) 2014   vis sleep study- no sleep apnea   Shingles 07/2017   Tobacco  abuse    not tolerated Chantix in the past   Vitamin D deficiency     Patient Active Problem List   Diagnosis Date Noted   Abnormal glucose 11/24/2018   Labile hypertension 03/05/2018   Tobacco use disorder 01/16/2015   TMJ (dislocation of temporomandibular joint) 01/16/2015   Vitamin D deficiency    Hepatomegaly    Mixed hyperlipidemia    Panic disorder    Anal fissure    Depression, major, in remission (HCC)    Hypothyroidism    Obesity     Past Surgical History:  Procedure Laterality Date   CHOLECYSTECTOMY     HEMORROIDECTOMY     LEFT OOPHORECTOMY     TONSILLECTOMY       OB History     Gravida  1   Para  1   Term      Preterm      AB      Living  1      SAB      IAB      Ectopic      Multiple      Live Births              Family History  Problem Relation Age of Onset   Breast cancer Mother    Diabetes Mother    Drug abuse Brother  and alcohol   Cirrhosis Father        alcohol   Heart disease Father        MI   Colon cancer Maternal Grandmother 350   Alcohol abuse Sister    Breast cancer Cousin    Thyroid disease Neg Hx     Social History   Tobacco Use   Smoking status: Every Day    Packs/day: 1.00    Years: 32.00    Pack years: 32.00    Types: Cigarettes    Start date: 1990   Smokeless tobacco: Never  Vaping Use   Vaping Use: Never used  Substance Use Topics   Alcohol use: Yes    Alcohol/week: 0.0 standard drinks    Comment: occ glass of wine   Drug use: No    Home Medications Prior to Admission medications   Medication Sig Start Date End Date Taking? Authorizing Provider  buPROPion (WELLBUTRIN XL) 150 MG 24 hr tablet Take 1 tablet (150 mg total) by mouth daily. 03/27/21   Cottle, Steva Readyarey G Jr., MD  Cetirizine HCl 10 MG CAPS Take by mouth.    [provider]  clonazePAM (KLONOPIN) 1 MG tablet TAKE 1/2 TABLET 3 TIMES DAILY,1 TABLET AT BEDTIME AND 1/2 TABLET DAILY AS NEEDED. KEEP MD APPT 03/27/21   Cottle,  Steva Readyarey G Jr., MD  gabapentin (NEURONTIN) 100 MG capsule TAKE 1 TO 3 CAPSULES BY MOUTH AS NEEDED FOR  RESTLESS  LEGS 01/24/21   Cottle, Steva Readyarey G Jr., MD  ibuprofen (ADVIL) 600 MG tablet Take 1 tablet (600 mg total) by mouth every 6 (six) hours as needed. Patient not taking: Reported on 03/27/2021 01/18/21   Mickie Bailate, Kelly H, NP  levothyroxine (SYNTHROID) 88 MCG tablet TAKE 1 TABLET BY MOUTH ONCE DAILY BEFORE BREAKFAST 05/02/21   Jerene BearsMiller, [name], MD  mometasone (NASONEX) 50 MCG/ACT nasal spray Place 2 sprays into the nose daily.    [provider]  pravastatin (PRAVACHOL) 40 MG tablet Take  1 tablet  at Bedtime  for Cholesterol 01/26/21   Lucky CowboyMcKeown, William, MD  sertraline (ZOLOFT) 100 MG tablet Take 2.5 tablets (250 mg total) by mouth daily. 03/27/21   Cottle, Steva Readyarey G Jr., MD  sodium chloride (OCEAN) 0.65 % SOLN nasal spray Place 1 spray into both nostrils as needed for congestion.    [provider]    Allergies    Patient has no known allergies.  Review of Systems   Review of Systems  Respiratory:  Positive for shortness of breath.   Cardiovascular:  Positive for chest pain.  Musculoskeletal:  Positive for back pain.  All other systems reviewed and are negative.  Physical Exam Updated Vital Signs BP 112/65   Pulse 81   Temp 98.4 F (36.9 C) (Oral)   Resp 18   Ht 5\' 8"  (1.727 m)   Wt 90.7 kg   LMP 08/04/2012   SpO2 98%   BMI 30.41 kg/m   Physical Exam Vitals and nursing note reviewed.  Constitutional:      General: She is not in acute distress.    Appearance: Normal appearance. She is well-developed.  HENT:     Head: Normocephalic and atraumatic.     Right Ear: Hearing normal.     Left Ear: Hearing normal.     Nose: Nose normal.  Eyes:     Conjunctiva/sclera: Conjunctivae normal.     Pupils: Pupils are equal, round, and reactive to light.  Cardiovascular:     Rate  and Rhythm: Regular rhythm.     Heart sounds: S1 normal and S2 normal. No murmur heard.   No  friction rub. No gallop.  Pulmonary:     Effort: Pulmonary effort is normal. No respiratory distress.     Breath sounds: Normal breath sounds.  Chest:     Chest wall: No tenderness.  Abdominal:     General: Bowel sounds are normal.     Palpations: Abdomen is soft.     Tenderness: There is no abdominal tenderness. There is no guarding or rebound. Negative signs include Murphy's sign and McBurney's sign.     Hernia: No hernia is present.  Musculoskeletal:        General: Normal range of motion.     Cervical back: Normal range of motion and neck supple.  Skin:    General: Skin is warm and dry.     Findings: No rash.  Neurological:     Mental Status: She is alert and oriented to person, place, and time.     GCS: GCS eye subscore is 4. GCS verbal subscore is 5. GCS motor subscore is 6.     Cranial Nerves: No cranial nerve deficit.     Sensory: No sensory deficit.     Coordination: Coordination normal.  Psychiatric:        Speech: Speech normal.        Behavior: Behavior normal.        Thought Content: Thought content normal.    ED Results / Procedures / Treatments   Labs (all labs ordered are listed, but only abnormal results are displayed) Labs Reviewed  BASIC METABOLIC PANEL  CBC  PREGNANCY, URINE  TROPONIN I (HIGH SENSITIVITY)  TROPONIN I (HIGH SENSITIVITY)    EKG EKG Interpretation  Date/Time:  Thursday May 09 2021 20:30:39 EDT Ventricular Rate:  92 PR Interval:  122 QRS Duration: 78 QT Interval:  370 QTC Calculation: 457 R Axis:   63 Text Interpretation: Normal sinus rhythm Normal ECG Confirmed by Gilda Crease [phone]) on 05/10/2021 1:12:57 AM  Radiology DG Chest 2 View  Result Date: 05/09/2021 CLINICAL DATA:  Chest pain.  Nausea and dizziness. EXAM: CHEST - 2 VIEW COMPARISON:  Radiograph 09/15/2016 FINDINGS: The cardiomediastinal contours are normal. Mild atherosclerosis of the aortic arch. Pulmonary vasculature is normal. No consolidation, pleural  effusion, or pneumothorax. No acute osseous abnormalities are seen. IMPRESSION: No acute chest findings. Electronically Signed   By: Narda Rutherford M.D.   On: 05/09/2021 21:05   CT ANGIO CHEST AORTA W/CM & OR WO/CM  Result Date: 05/10/2021 CLINICAL DATA:  Chest pain or back pain, aortic dissection suspected EXAM: CT ANGIOGRAPHY CHEST WITH CONTRAST TECHNIQUE: Multidetector CT imaging of the chest was performed using the standard protocol during bolus administration of intravenous contrast. Multiplanar CT image reconstructions and MIPs were obtained to evaluate the vascular anatomy. CONTRAST:  OMNIPAQUE IOHEXOL 350 MG/ML SOLN COMPARISON:  Radiograph earlier today.  Remote chest CT 04/22/2012 FINDINGS: Cardiovascular: Mild atherosclerosis of the aortic arch. No evidence of aortic hematoma noncontrast exam. There is no aortic dissection, aneurysm, or evidence of acute aortic syndrome. Conventional branching pattern from the aortic arch. The heart is normal in size. No pericardial effusion. The pulmonary arteries are well opacified. There is no pulmonary embolus to the subsegmental level. Mediastinum/Nodes: No enlarged mediastinal or hilar lymph nodes. No esophageal wall thickening. No visualized thyroid nodule. Lungs/Pleura: No acute or focal airspace disease. No pleural fluid. No findings of pulmonary edema. Trachea and  central bronchi are patent. Mild central bronchial thickening. No pulmonary mass or suspicious nodule. Upper Abdomen: No acute or unexpected findings.  Cholecystectomy. Musculoskeletal: There are no acute or suspicious osseous abnormalities. Review of the MIP images confirms the above findings. IMPRESSION: 1. No aortic dissection or acute aortic abnormality. No pulmonary embolus. 2. Mild central bronchial thickening, can be seen with smoking, bronchitis or reactive airways disease. Aortic Atherosclerosis (ICD10-I70.0). Electronically Signed   By: Narda Rutherford M.D.   On: 05/10/2021 01:52     Procedures Procedures   Medications Ordered in ED Medications  iohexol (OMNIPAQUE) 350 MG/ML injection 100 mL (100 mLs Intravenous Contrast Given 05/10/21 0122)    ED Course  I have reviewed the triage vital signs and the nursing notes.  Pertinent labs & imaging results that were available during my care of the patient were reviewed by me and considered in my medical decision making (see chart for details).    MDM Rules/Calculators/A&P HEAR Score: 3                         Patient presents with central chest pain radiating into her back.  Symptoms have now been present for 11 hours.  Symptoms onset while eating but have been persistent ever since.  HEAR score is 3.  Symptoms do seem atypical for cardiac etiology.  Pain is well circumscribed in the center of the chest and radiates into the back.  It is sharp in nature.  CT angiography does not show any evidence of aortic dissection.  EKG is normal.  Troponin negative x2.  Suspect noncardiac etiology of the chest pain.  She does have some reproducibility with movement.  Pain began while eating.  No right upper quadrant tenderness to suggest gallbladder disease.  Patient does report a recent back injury for which she was taking increased doses of ibuprofen.  Avoid NSAIDs, initiate Protonix.  Follow-up with primary care.  Final Clinical Impression(s) / ED Diagnoses Final diagnoses:  Chest pain, unspecified type    Rx / DC Orders ED Discharge Orders     None        Gilda Crease, MD 05/10/21 [phone]

## 2021-05-13 ENCOUNTER — Other Ambulatory Visit: Payer: Self-pay | Admitting: Psychiatry

## 2021-05-13 DIAGNOSIS — F4001 Agoraphobia with panic disorder: Secondary | ICD-10-CM

## 2021-05-13 DIAGNOSIS — F411 Generalized anxiety disorder: Secondary | ICD-10-CM

## 2021-05-16 ENCOUNTER — Other Ambulatory Visit: Payer: Self-pay | Admitting: Obstetrics & Gynecology

## 2021-05-16 DIAGNOSIS — Z1231 Encounter for screening mammogram for malignant neoplasm of breast: Secondary | ICD-10-CM

## 2021-06-06 ENCOUNTER — Other Ambulatory Visit: Payer: Self-pay

## 2021-06-06 ENCOUNTER — Ambulatory Visit (HOSPITAL_BASED_OUTPATIENT_CLINIC_OR_DEPARTMENT_OTHER)
Admission: RE | Admit: 2021-06-06 | Discharge: 2021-06-06 | Disposition: A | Discharge status: Home / Self Care | Payer: Medicare Other | Source: Ambulatory Visit | Attending: Obstetrics & Gynecology | Admitting: Obstetrics & Gynecology

## 2021-06-06 ENCOUNTER — Encounter (HOSPITAL_BASED_OUTPATIENT_CLINIC_OR_DEPARTMENT_OTHER): Payer: Self-pay | Admitting: Obstetrics & Gynecology

## 2021-06-06 ENCOUNTER — Ambulatory Visit (INDEPENDENT_AMBULATORY_CARE_PROVIDER_SITE_OTHER): Payer: Medicare Other | Admitting: Obstetrics & Gynecology

## 2021-06-06 VITALS — BP 114/66 | HR 97 | Ht 66.5 in | Wt 227.6 lb

## 2021-06-06 DIAGNOSIS — Z780 Asymptomatic menopausal state: Secondary | ICD-10-CM | POA: Diagnosis not present

## 2021-06-06 DIAGNOSIS — Z01419 Encounter for gynecological examination (general) (routine) without abnormal findings: Secondary | ICD-10-CM

## 2021-06-06 DIAGNOSIS — Z1231 Encounter for screening mammogram for malignant neoplasm of breast: Secondary | ICD-10-CM | POA: Diagnosis not present

## 2021-06-06 DIAGNOSIS — E039 Hypothyroidism, unspecified: Secondary | ICD-10-CM

## 2021-06-06 DIAGNOSIS — Z90721 Acquired absence of ovaries, unilateral: Secondary | ICD-10-CM | POA: Diagnosis not present

## 2021-06-06 DIAGNOSIS — F17200 Nicotine dependence, unspecified, uncomplicated: Secondary | ICD-10-CM

## 2021-06-06 DIAGNOSIS — F419 Anxiety disorder, unspecified: Secondary | ICD-10-CM

## 2021-06-06 DIAGNOSIS — E559 Vitamin D deficiency, unspecified: Secondary | ICD-10-CM

## 2021-06-06 NOTE — Progress Notes (Signed)
55 y.o. G1P1 Single White or Caucasian female here for breast and pelvic gyn exam.  Denies vaginal bleeding.  Was seen in ER 7/28 for chest pain.  Diagnosed with GERD.  Pt reports is having some anxiety issues.  Had a 3 yo niece who had sudden death last year.  Is doing grief counseling with [facility].  Did grief counseling with Hospice for a year.    Has some LLQ pain/cramping that is present intermittently.  Not related to bowel movements.  Not related to bladder function.  Coconut oil discussed last year.  She has not tried this.    Frustrated with weight.    Denies vaginal bleeding.  Patient's last menstrual period was 08/04/2012.          Sexually active: No.  H/O STD:  no  Health Maintenance: PCP:  Dr. Oneta Rack.  Last wellness appt is 06/13/2021.  Did blood work in May. Vaccines are up to date:  recommended pneumonia vaccination Colonoscopy:  2016, follow up 10 years MMG:  today BMD:  not indicated Last pap smear:  2021.   H/o abnormal pap smear:  remote hx of abnormal pap   reports that she has been smoking cigarettes. She started smoking about 32 years ago. She has a 32.00 pack-year smoking history. She has never used smokeless tobacco. She reports current alcohol use. She reports that she does not use drugs.  Past Medical History:  Diagnosis Date   Anal fissure 1994   Anxiety    Arthritis    Dr. Penni Bombard at [facility]   Depression    Endometriosis    Fracture 08/2011   fracture left wrist and elbow   Hemorrhoids 2008   Hepatomegaly    Hypothyroidism    Mixed hyperlipidemia    Obesity    OCD (obsessive compulsive disorder)    Panic disorder    Plantar fasciitis, right 03/2014   Prediabetes 01/16/2015   RLS (restless legs syndrome) 2014   vis sleep study- no sleep apnea   Shingles 07/2017   Tobacco abuse    not tolerated Chantix in the past   Vitamin D deficiency     Past Surgical History:  Procedure Laterality Date   CHOLECYSTECTOMY      HEMORROIDECTOMY     LEFT OOPHORECTOMY     TONSILLECTOMY      Current Outpatient Medications  Medication Sig Dispense Refill   Cetirizine HCl 10 MG CAPS Take by mouth.     Cholecalciferol (VITAMIN D3) 1.25 MG (50000 UT) TABS Take 50,000 Int'l Units by mouth once a week.     clonazePAM (KLONOPIN) 1 MG tablet TAKE 1/2 TABLET 3 TIMES DAILY,1 TABLET AT BEDTIME AND 1/2 TABLET DAILY AS NEEDED. KEEP MD APPT 90 tablet 1   gabapentin (NEURONTIN) 100 MG capsule TAKE 1 TO 3 CAPSULES BY MOUTH AS NEEDED FOR  RESTLESS  LEGS 90 capsule 0   levothyroxine (SYNTHROID) 88 MCG tablet TAKE 1 TABLET BY MOUTH ONCE DAILY BEFORE BREAKFAST 90 tablet 0   mometasone (NASONEX) 50 MCG/ACT nasal spray Place 2 sprays into the nose daily.     pravastatin (PRAVACHOL) 40 MG tablet Take  1 tablet  at Bedtime  for Cholesterol 90 tablet 3   sertraline (ZOLOFT) 100 MG tablet Take 2.5 tablets (250 mg total) by mouth daily. 225 tablet 1   sodium chloride (OCEAN) 0.65 % SOLN nasal spray Place 1 spray into both nostrils as needed for congestion.     buPROPion (WELLBUTRIN XL) 150 MG 24  hr tablet Take 1 tablet (150 mg total) by mouth daily. (Patient not taking: Reported on 06/06/2021) 30 tablet 1   ibuprofen (ADVIL) 600 MG tablet Take 1 tablet (600 mg total) by mouth every 6 (six) hours as needed. (Patient not taking: No sig reported) 30 tablet 0   pantoprazole (PROTONIX) 40 MG tablet Take 1 tablet (40 mg total) by mouth daily. (Patient not taking: Reported on 06/06/2021) 30 tablet 3   No current facility-administered medications for this visit.    Family History  Problem Relation Age of Onset   Breast cancer Mother    Diabetes Mother    Drug abuse Brother        and alcohol   Cirrhosis Father        alcohol   Heart disease Father        MI   Colon cancer Maternal Grandmother 13   Alcohol abuse Sister    Breast cancer Cousin    Thyroid disease Neg Hx     Review of Systems  Constitutional: Negative.   Gastrointestinal:  Negative.   Genitourinary: Negative.   Psychiatric/Behavioral:  The patient is nervous/anxious.    Exam:   BP 114/66 (BP Location: Right Arm, Patient Position: Sitting, Cuff Size: Large)   Pulse 97   Ht 5' 6.5" (1.689 m)   Wt 227 lb 9.6 oz (103.2 kg)   LMP 08/04/2012   BMI 36.19 kg/m   Height: 5' 6.5" (168.9 cm)  General appearance: alert, cooperative and appears stated age Breasts: normal appearance, no masses or tenderness Abdomen: soft, non-tender; bowel sounds normal; no masses,  no organomegaly Lymph nodes: Cervical, supraclavicular, and axillary nodes normal.  No abnormal inguinal nodes palpated Neurologic: Grossly normal  Pelvic: External genitalia:  no lesions              Urethra:  normal appearing urethra with no masses, tenderness or lesions              Bartholins and Skenes: normal                 Vagina: normal appearing vagina with atrophic changes and no discharge, no lesions              Cervix: no lesions              Pap taken: No. Bimanual Exam:  Uterus:  normal size, contour, position, consistency, mobility, non-tender              Adnexa: no mass, fullness, tenderness               Rectovaginal: Confirms               Anus:  normal sphincter tone, no lesions  Chaperone declined  Assessment/Plan: 1. Encntr for gyn exam (general) (routine) w/o abn findings - pap negative 2021 - MMG done today.  Order placed for next year for pt to do on same day as exam - colonoscopy 2016, follow up 10 years - BMD not indicated.  Plan closer to age 56. - lab work will be done 06/13/2021 - vaccines reviewed/updated  2. Postmenopausal - no HRT  3. History of oophorectomy, unilateral - left  4. Tobacco use disorder  5. Hypothyroidism, unspecified type  6. Anxiety  7. Vitamin D deficiency  8. Encounter for screening mammogram for malignant neoplasm of breast - MM 3D SCREEN BREAST BILATERAL; Future

## 2021-06-13 ENCOUNTER — Other Ambulatory Visit: Payer: Self-pay

## 2021-06-13 ENCOUNTER — Ambulatory Visit (INDEPENDENT_AMBULATORY_CARE_PROVIDER_SITE_OTHER): Payer: Medicare Other | Admitting: Adult Health

## 2021-06-13 ENCOUNTER — Encounter: Payer: Self-pay | Admitting: Adult Health

## 2021-06-13 ENCOUNTER — Ambulatory Visit (HOSPITAL_BASED_OUTPATIENT_CLINIC_OR_DEPARTMENT_OTHER): Payer: Medicare Other | Admitting: Obstetrics & Gynecology

## 2021-06-13 VITALS — BP 110/70 | HR 90 | Temp 97.5°F | Wt 226.0 lb

## 2021-06-13 DIAGNOSIS — S0300XD Dislocation of jaw, unspecified side, subsequent encounter: Secondary | ICD-10-CM

## 2021-06-13 DIAGNOSIS — Z0001 Encounter for general adult medical examination with abnormal findings: Secondary | ICD-10-CM | POA: Diagnosis not present

## 2021-06-13 DIAGNOSIS — R160 Hepatomegaly, not elsewhere classified: Secondary | ICD-10-CM | POA: Diagnosis not present

## 2021-06-13 DIAGNOSIS — Z0000 Encounter for general adult medical examination without abnormal findings: Secondary | ICD-10-CM

## 2021-06-13 DIAGNOSIS — F17200 Nicotine dependence, unspecified, uncomplicated: Secondary | ICD-10-CM

## 2021-06-13 DIAGNOSIS — R5381 Other malaise: Secondary | ICD-10-CM

## 2021-06-13 DIAGNOSIS — F410 Panic disorder [episodic paroxysmal anxiety] without agoraphobia: Secondary | ICD-10-CM

## 2021-06-13 DIAGNOSIS — R0989 Other specified symptoms and signs involving the circulatory and respiratory systems: Secondary | ICD-10-CM | POA: Diagnosis not present

## 2021-06-13 DIAGNOSIS — E669 Obesity, unspecified: Secondary | ICD-10-CM

## 2021-06-13 DIAGNOSIS — R6889 Other general symptoms and signs: Secondary | ICD-10-CM | POA: Diagnosis not present

## 2021-06-13 DIAGNOSIS — Z136 Encounter for screening for cardiovascular disorders: Secondary | ICD-10-CM

## 2021-06-13 DIAGNOSIS — K602 Anal fissure, unspecified: Secondary | ICD-10-CM | POA: Diagnosis not present

## 2021-06-13 DIAGNOSIS — E039 Hypothyroidism, unspecified: Secondary | ICD-10-CM | POA: Diagnosis not present

## 2021-06-13 DIAGNOSIS — R7309 Other abnormal glucose: Secondary | ICD-10-CM | POA: Diagnosis not present

## 2021-06-13 DIAGNOSIS — E782 Mixed hyperlipidemia: Secondary | ICD-10-CM | POA: Diagnosis not present

## 2021-06-13 DIAGNOSIS — E559 Vitamin D deficiency, unspecified: Secondary | ICD-10-CM

## 2021-06-13 DIAGNOSIS — Z8249 Family history of ischemic heart disease and other diseases of the circulatory system: Secondary | ICD-10-CM

## 2021-06-13 DIAGNOSIS — F325 Major depressive disorder, single episode, in full remission: Secondary | ICD-10-CM

## 2021-06-13 DIAGNOSIS — Z9181 History of falling: Secondary | ICD-10-CM

## 2021-06-13 MED ORDER — BACITRACIN 500 UNIT/GM EX OINT: 1.0000 "application " | TOPICAL_OINTMENT | Freq: Two times a day (BID) | CUTANEOUS | 0 refills | Status: DC

## 2021-06-13 NOTE — Patient Instructions (Signed)
  SMOKING CESSATION  [employer]  [phone] for more information or for a free program for smoking cessation help.   You can call QUIT SMART 1-800-QUIT-NOW for free nicotine patches or replacement therapy- if they are out- keep calling  [facility] Can call for smoking cessation classes, [phone]  If you have a smart phone, please look up Smoke Free app, this will help you stay on track and give you information about money you have saved, life that you have gained back and a ton of more information.     ADVANTAGES OF QUITTING SMOKING  Within 20 minutes, blood pressure decreases. Your pulse is at normal level.  After 8 hours, carbon monoxide levels in the blood return to normal. Your oxygen level increases.  After 24 hours, the chance of having a heart attack starts to decrease. Your breath, hair, and body stop smelling like smoke.  After 48 hours, damaged nerve endings begin to recover. Your sense of taste and smell improve.  After 72 hours, the body is virtually free of nicotine. Your bronchial tubes relax and breathing becomes easier.  After 2 to 12 weeks, lungs can hold more air. Exercise becomes easier and circulation improves.  After 1 year, the risk of coronary heart disease is cut in half.  After 5 years, the risk of stroke falls to the same as a nonsmoker.  After 10 years, the risk of lung cancer is cut in half and the risk of other cancers decreases significantly.  After 15 years, the risk of coronary heart disease drops, usually to the level of a nonsmoker.  You will have extra money to spend on things other than cigarettes.   

## 2021-06-13 NOTE — Progress Notes (Signed)
ANNUAL MEDICARE VISIT AND FOLLOW UP  Assessment:   Annual Medicare Wellness Visit Due annually  Health maintenance reviewed   Hypothyroidism, unspecified hypothyroidism type -check TSH level, continue medications the same, reminded to take on an empty stomach 30-46mins before food.  - TSH  Mixed hyperlipidemia -continue medications, check lipids, decrease fatty foods, increase activity. 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    Other abnormal glucose A1C annually Discussed disease and risks Discussed diet/exercise, weight management  - CPM/GFR  TMJ Improved; monitor   Morbid obesity (HCC) - BMI 35 with hld, htn Long discussion about weight loss, diet, and exercise Recommended diet heavy in fruits and veggies and low in animal meats, cheeses, and dairy products, appropriate calorie intake Discussed appropriate weight for height Follow up at next visit  Hepatomegaly Weight loss encouraged, monitor LFTs  Atypical chest pain/ family hx of cardiovascular disease Has had normal EKGs, non-exertional though does have increased dyspnea  Low risk myoview 02/2010 Recent negative serial troponins during episode, felt atypical  However high risk, smoker, poor lifestyle, hld, family hx Patient very anxious; discussed CT coronary calcium score with low threshold for referral back to cardiology Also emphasized risk reduction by lifestyle change and med Will control LDL <70 Reduce tobacco, caffeine;  Go to the ER if any chest pain, shortness of  breath, nausea, dizziness, severe HA, changes vision/speech  Sedentary; deconditioning; increased risk of injury related to fall Following prolonged period of immobility; dicussed and PT referral placed per her preference  Over 40 minutes of exam, counseling, chart review and critical decision making was performed  Future Appointments  Date Time Provider Department Center  07/24/2021  3:30 PM Cottle, Steva Ready., MD [facility] None  09/12/2021  2:00 PM Judd Gaudier, NP [facility] None  06/17/2022  2:30 PM Judd Gaudier, NP [facility] None     Plan:   During the course of the visit the patient was educated and counseled about appropriate screening and preventive services including:   Pneumococcal vaccine  Prevnar 13 Influenza vaccine Td vaccine Screening electrocardiogram Bone densitometry screening Colorectal cancer screening Diabetes screening Glaucoma screening Nutrition counseling  Advanced directives: requested    Subjective:  Susan Bartlett is a 55 y.o. presents for follow up and AWV. She has Hepatomegaly; Mixed hyperlipidemia; Panic disorder; Anal fissure; Depression, major, in remission (HCC); Hypothyroidism; Obesity (BMI 30-39.9); Vitamin D deficiency; Tobacco use disorder; TMJ (dislocation of temporomandibular joint); Labile hypertension; and Abnormal glucose on their problem list.  She reports has been very sedentary (barely getting out of bed) from 12/2019 after her niece passed until 12/20/2020, 1 year due to severe depression (follows with Dr. Jennelle Human who prescribes meds), has been working to increase activity by getting out and walk more. However notes remains weak, has noted some unsteadiness, has had fall with injury.   She was reporting episodes of palpitations, had normal labs, EKG on 02/20/2021. She presented to ED 05/09/2021 with chest pain, felt atypical, ? NSAID related (has since stopped), had negative serial troponins and EKG, had CTA unremarkable other than aortic  atherosclerosis and central bronchia thickening.  She was Dr. Eden Emms with low risk myoview in 2011. She remains very anxious about her cardiac risk. Significant family hx of CVD.   She is a smoker, smoking 1.5 packs/day, since she was 55 years old, no mass or nodule was noted on CTA in 05/09/2021, we will plan to start annual low dose screening CT in 2023.   She has lumbar back pain, has seen Dr. Penni Bombard at [facility], no surgery was advised. Will follow up for flares, resolved with NSAID taper.   BMI is Body mass index is 35.93 kg/m., she has not been working on diet and exercise except walking some laps around house in recent weeks.  She also drinks diet mountain dew, 12 pack diet, does 1/2 caffeine free  Wt Readings from Last 3 Encounters:  06/13/21 226 lb (102.5 kg)  06/06/21 227 lb 9.6 oz (103.2 kg)  05/09/21 200 lb (90.7 kg)   Today their BP is BP: 110/70. She denies dyspnea, dizziness. Has had chest pain episodes.   She is on cholesterol medication, pravastatin 40 mg daily and denies myalgias. Her cholesterol is not at goal. The cholesterol last visit was:   Lab Results  Component Value Date   CHOL 197 02/20/2021   HDL 46 (L) 02/20/2021   LDLCALC 116 (H) 02/20/2021   TRIG 232 (H) 02/20/2021   CHOLHDL 4.3 02/20/2021   Last A2Z in the office was:  Lab Results  Component Value Date   HGBA1C 5.2 02/20/2021   She has hx of vit D def. Patient was off of vitamin D last check, back to taking 30865 IU 1 once weekly.  Lab Results  Component Value Date   VD25OH 21 (L) 02/20/2021   She is on thyroid medication, she is on 88 mcg 1 tab daily, on empty stomach, was taking irregularly, back to taking regularly.  Lab Results  Component Value Date   TSH 6.44 (H) 02/20/2021     Current Outpatient Medications  Medication Instructions   bacitracin 500 UNIT/GM ointment 1 application, Topical, 2 times daily   Cetirizine HCl 10 MG CAPS Oral   Cholecalciferol (VITAMIN D3) 1.25 MG  (50000 UT) TABS 50,000 Int'l Units, Oral, Weekly   clonazePAM (KLONOPIN) 1 MG tablet TAKE 1/2 TABLET 3 TIMES DAILY,1 TABLET AT BEDTIME AND 1/2 TABLET DAILY AS NEEDED. KEEP MD APPT   gabapentin (NEURONTIN) 100 MG capsule TAKE 1 TO 3 CAPSULES BY MOUTH AS NEEDED FOR  RESTLESS  LEGS   levothyroxine (SYNTHROID) 88 MCG tablet TAKE 1 TABLET BY MOUTH ONCE DAILY BEFORE BREAKFAST   mometasone (NASONEX) 50 MCG/ACT nasal spray 2 sprays, Nasal, Daily   pravastatin (PRAVACHOL) 40 MG tablet Take  1 tablet  at Bedtime  for Cholesterol   sertraline (ZOLOFT) 250 mg, Oral, Daily   sodium chloride (OCEAN) 0.65 % SOLN nasal spray 1 spray, Each Nare, As needed     Current Problems (verified) Patient Active Problem List   Diagnosis Date Noted   Abnormal glucose 11/24/2018   Labile hypertension 03/05/2018   Tobacco use disorder 01/16/2015   TMJ (dislocation of temporomandibular joint) 01/16/2015   Vitamin D deficiency    Hepatomegaly    Mixed hyperlipidemia    Panic disorder    Anal fissure    Depression, major, in remission (HCC)    Hypothyroidism    Obesity (BMI 30-39.9)     Immunization History  Administered Date(s) Administered   Influenza Split 08/01/2013   Td 11/18/2012   Preventative care: Tetanus: 11/2012 Pneumovax: declines  Pneum 20: declines Flu vaccines declines Shingrix: will consider  Covid 19: declines   Pap:  10/2019, Dr. Hyacinth Meeker MGM: 05/2021 DEXA: 2008, heel neg  Colonoscopy: 11/2014 due 10 years EGD: N/A  PFT: 01/2013 Stress test: 02/2010 Eden Emms)  CTA chest: 05/09/2021, no nodules, aortic atherosclerosis   Names of Other Physician/Practitioners you currently use: 1. [facility] here for primary care 2.  [facility], eye doctor, last visit 1999 3. Dr. Lequita Asal , dentist, last 2022, goes q43m   Patient Care Team: Lucky Cowboy, MD as PCP - General (Internal Medicine) Wendall Stade, MD as Consulting Physician  (Cardiology) Iva Boop, MD as Consulting Physician (Gastroenterology) Jerene Bears, MD as Consulting Physician (Gynecology) Cottle, Steva Ready., MD as Attending Physician (Psychiatry)    Allergies No Known Allergies  SURGICAL HISTORY She  has a past surgical history that includes Hemorroidectomy; Cholecystectomy; Left oophorectomy; and Tonsillectomy. FAMILY HISTORY Her family history includes Alcohol abuse in her sister; Breast cancer in her cousin and mother; Cirrhosis in her father; Colon cancer (age of onset: 16) in her maternal grandmother; Diabetes in her mother; Drug abuse in her brother; Heart disease in her father. SOCIAL HISTORY She  reports that she has been smoking cigarettes. She started smoking about 32 years ago. She has a 32.00 pack-year smoking history. She has never used smokeless tobacco. She reports current alcohol use. She reports that she does not use drugs.  MEDICARE WELLNESS OBJECTIVES: Physical activity: Current Exercise Habits: The patient does not participate in regular exercise at present, Exercise limited by: orthopedic condition(s);psychological condition(s) Cardiac risk factors: Cardiac Risk Factors include: advanced age (>1men, >51 women);dyslipidemia;hypertension;obesity (BMI >30kg/m2);sedentary lifestyle;smoking/ tobacco exposure Depression/mood screen:   Depression screen [facility] 2/9 06/06/2021  Decreased Interest 0  Down, Depressed, Hopeless 0  PHQ - 2 Score 0  Altered sleeping -  Tired, decreased energy -  Change in appetite -  Feeling bad or failure about yourself  -  Trouble concentrating -  Moving slowly or fidgety/restless -  Suicidal thoughts -  PHQ-9 Score -  Difficult doing work/chores -    ADLs:  In your present state of health, do you have any difficulty performing the following activities: 06/13/2021  Hearing? N  Vision? N  Difficulty concentrating or making decisions? N  Walking or climbing stairs? Y  Comment more unsteady gait   Dressing or bathing? N  Doing errands, shopping? N  Some recent data might be hidden     Cognitive Testing  Alert? Yes  Normal Appearance?Yes  Oriented to person? Yes  Place? Yes   Time? Yes  Recall of three objects?  Yes  Can perform simple calculations? Yes  Displays appropriate judgment?Yes  Can read the correct time from a watch face?Yes  EOL planning: Does Patient Have a Medical Advance Directive?: No Would patient like information on creating a medical advance directive?: No - Patient declined     Review of Systems  Constitutional:  Negative for chills, fever, malaise/fatigue and weight loss.  HENT:  Negative for congestion, ear pain, hearing loss, sore throat and tinnitus.   Eyes:  Negative for blurred vision and double vision.  Respiratory:  Positive for shortness of breath. Negative for cough, sputum production and wheezing.   Cardiovascular:  Positive for palpitations. Negative for chest pain, orthopnea, claudication and leg swelling.  Gastrointestinal:  Negative for abdominal pain, blood in stool, constipation, diarrhea, heartburn, melena, nausea and vomiting.  Genitourinary: Negative.   Musculoskeletal:  Positive for falls. Negative for joint pain and myalgias.  Skin:  Negative for rash.  Neurological:  Negative for dizziness, tingling, sensory change, weakness and headaches.  Endo/Heme/Allergies:  Negative for polydipsia.  Psychiatric/Behavioral:  Positive for depression. Negative for hallucinations, substance abuse and suicidal ideas. The patient is nervous/anxious. The patient does not have insomnia.   All other systems reviewed and are negative.  Objective:     Blood pressure 110/70, pulse 90, temperature (!) 97.5 F (36.4 C), weight 226 lb (102.5 kg), last menstrual period 08/04/2012, SpO2 99 %. Body mass index is 35.93 kg/m.  General appearance: alert, no distress, WD/WN, female HEENT: normocephalic, sclerae anicteric, TMs pearly, nares patent, no  discharge or erythema, pharynx normal Oral cavity: MMM, no lesions Neck: supple, no lymphadenopathy, no thyromegaly, no masses Heart: RRR, normal S1, S2, no murmurs Lungs: CTA bilaterally, no wheezes, rhonchi, or rales Abdomen: +bs, soft, non tender, non distended, no masses, no hepatomegaly, no splenomegaly Musculoskeletal: nontender, no swelling, no obvious deformity Extremities: no edema, no cyanosis, no clubbing Pulses: 2+ symmetric, upper and lower extremities, normal cap refill Neurological: alert, oriented x 3, CN2-12 intact, strength normal upper extremities and lower extremities, sensation normal throughout, normal gait Psychiatric: mildly anxious affect, behavior normal, pleasant     Medicare Attestation I have personally reviewed: The patient's medical and social history Their use of alcohol, tobacco or illicit drugs Their current medications and supplements The patient's functional ability including ADLs,fall risks, home safety risks, cognitive, and hearing and visual impairment Diet and physical activities Evidence for depression or mood disorders  The patient's weight, height, BMI, and visual acuity have been recorded in the chart.  I have made referrals, counseling, and provided education to the patient based on review of the above and I have provided the patient with a written personalized care plan for preventive services.     Dan MakerAshley C Jacobie Stamey, NP   06/13/2021

## 2021-06-14 ENCOUNTER — Other Ambulatory Visit: Payer: Self-pay | Admitting: Adult Health

## 2021-06-14 ENCOUNTER — Encounter: Payer: Self-pay | Admitting: Adult Health

## 2021-06-14 DIAGNOSIS — E039 Hypothyroidism, unspecified: Secondary | ICD-10-CM

## 2021-06-14 LAB — CBC WITH DIFFERENTIAL/PLATELET
Absolute Monocytes: 498 cells/uL (ref 200–950)
Basophils Absolute: 47 cells/uL (ref 0–200)
Basophils Relative: 0.6 %
Eosinophils Absolute: 292 cells/uL (ref 15–500)
Eosinophils Relative: 3.7 %
HCT: 43.4 % (ref 35.0–45.0)
Hemoglobin: 14.5 g/dL (ref 11.7–15.5)
Lymphs Abs: 1533 cells/uL (ref 850–3900)
MCH: 31 pg (ref 27.0–33.0)
MCHC: 33.4 g/dL (ref 32.0–36.0)
MCV: 92.9 fL (ref 80.0–100.0)
MPV: 11.1 fL (ref 7.5–12.5)
Monocytes Relative: 6.3 %
Neutro Abs: 5530 cells/uL (ref 1500–7800)
Neutrophils Relative %: 70 %
Platelets: 301 10*3/uL (ref 140–400)
RBC: 4.67 10*6/uL (ref 3.80–5.10)
RDW: 13.3 % (ref 11.0–15.0)
Total Lymphocyte: 19.4 %
WBC: 7.9 10*3/uL (ref 3.8–10.8)

## 2021-06-14 LAB — COMPLETE METABOLIC PANEL WITH GFR
AG Ratio: 1.8 (calc) (ref 1.0–2.5)
ALT: 20 U/L (ref 6–29)
AST: 21 U/L (ref 10–35)
Albumin: 4.6 g/dL (ref 3.6–5.1)
Alkaline phosphatase (APISO): 88 U/L (ref 37–153)
BUN: 7 mg/dL (ref 7–25)
CO2: 23 mmol/L (ref 20–32)
Calcium: 9.9 mg/dL (ref 8.6–10.4)
Chloride: 100 mmol/L (ref 98–110)
Creat: 0.64 mg/dL (ref 0.50–1.03)
Globulin: 2.5 g/dL (calc) (ref 1.9–3.7)
Glucose, Bld: 86 mg/dL (ref 65–99)
Potassium: 4.5 mmol/L (ref 3.5–5.3)
Sodium: 135 mmol/L (ref 135–146)
Total Bilirubin: 0.4 mg/dL (ref 0.2–1.2)
Total Protein: 7.1 g/dL (ref 6.1–8.1)
eGFR: 104 mL/min/{1.73_m2} (ref >=60)

## 2021-06-14 LAB — LIPID PANEL
Cholesterol: 224 mg/dL — ABNORMAL HIGH (ref <200)
HDL: 49 mg/dL — ABNORMAL LOW (ref >=50)
LDL Cholesterol (Calc): 136 mg/dL (calc) — ABNORMAL HIGH
Non-HDL Cholesterol (Calc): 175 mg/dL (calc) — ABNORMAL HIGH (ref <130)
Total CHOL/HDL Ratio: 4.6 (calc) (ref <5.0)
Triglycerides: 256 mg/dL — ABNORMAL HIGH (ref <150)

## 2021-06-14 LAB — TSH: TSH: 9.93 mIU/L — ABNORMAL HIGH (ref 0.40–4.50)

## 2021-06-14 LAB — MAGNESIUM: Magnesium: 1.9 mg/dL (ref 1.5–2.5)

## 2021-06-14 MED ORDER — ROSUVASTATIN CALCIUM 5 MG PO TABS: ORAL_TABLET | ORAL | 0 refills | Status: DC

## 2021-07-24 ENCOUNTER — Ambulatory Visit (INDEPENDENT_AMBULATORY_CARE_PROVIDER_SITE_OTHER): Payer: Medicare Other | Admitting: Psychiatry

## 2021-07-24 ENCOUNTER — Encounter: Payer: Self-pay | Admitting: Psychiatry

## 2021-07-24 DIAGNOSIS — R7989 Other specified abnormal findings of blood chemistry: Secondary | ICD-10-CM

## 2021-07-24 DIAGNOSIS — F4001 Agoraphobia with panic disorder: Secondary | ICD-10-CM | POA: Diagnosis not present

## 2021-07-24 DIAGNOSIS — F422 Mixed obsessional thoughts and acts: Secondary | ICD-10-CM

## 2021-07-24 DIAGNOSIS — G2581 Restless legs syndrome: Secondary | ICD-10-CM

## 2021-07-24 DIAGNOSIS — F331 Major depressive disorder, recurrent, moderate: Secondary | ICD-10-CM | POA: Diagnosis not present

## 2021-07-24 DIAGNOSIS — F5105 Insomnia due to other mental disorder: Secondary | ICD-10-CM

## 2021-07-24 DIAGNOSIS — F411 Generalized anxiety disorder: Secondary | ICD-10-CM

## 2021-07-24 MED ORDER — CLONAZEPAM 1 MG PO TABS: ORAL_TABLET | ORAL | 4 refills | Status: DC

## 2021-07-24 MED ORDER — SERTRALINE HCL 100 MG PO TABS: 250.0000 mg | ORAL_TABLET | Freq: Every day | ORAL | 1 refills | Status: DC
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
## 2021-08-12 ENCOUNTER — Other Ambulatory Visit: Payer: Self-pay | Admitting: Obstetrics & Gynecology

## 2021-08-12 DIAGNOSIS — R7989 Other specified abnormal findings of blood chemistry: Secondary | ICD-10-CM

## 2021-08-13 ENCOUNTER — Other Ambulatory Visit: Payer: Self-pay | Admitting: Internal Medicine

## 2021-08-13 DIAGNOSIS — R7989 Other specified abnormal findings of blood chemistry: Secondary | ICD-10-CM

## 2021-08-13 MED ORDER — LEVOTHYROXINE SODIUM 88 MCG PO TABS: ORAL_TABLET | ORAL | 3 refills | Status: DC

## 2021-09-10 ENCOUNTER — Other Ambulatory Visit: Payer: Self-pay | Admitting: Psychiatry

## 2021-09-10 DIAGNOSIS — F411 Generalized anxiety disorder: Secondary | ICD-10-CM

## 2021-09-10 DIAGNOSIS — F4001 Agoraphobia with panic disorder: Secondary | ICD-10-CM

## 2021-09-12 ENCOUNTER — Encounter: Payer: Medicare Other | Admitting: Adult Health

## 2021-09-12 DIAGNOSIS — E538 Deficiency of other specified B group vitamins: Secondary | ICD-10-CM | POA: Insufficient documentation

## 2021-09-12 DIAGNOSIS — Z0000 Encounter for general adult medical examination without abnormal findings: Secondary | ICD-10-CM

## 2021-09-12 DIAGNOSIS — I700 Atherosclerosis of aorta: Secondary | ICD-10-CM | POA: Insufficient documentation

## 2021-09-12 NOTE — Progress Notes (Deleted)
CPE AND FOLLOW UP  Assessment:   Encounter for Annual Physical Exam with abnormal findings Due annually  Health Maintenance reviewed Healthy lifestyle reviewed and goals set ***  Atherosclerosis of aortic arch (HCC) - per CT 04/2021 Control blood pressure, cholesterol, glucose, increase exercise.     Hypothyroidism, unspecified hypothyroidism type -check TSH level, continue medications the same, reminded to take on an empty stomach 30-16mins before food.  - TSH  Mixed hyperlipidemia -continue medications, check lipids, decrease fatty foods, increase activity. 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    Other abnormal glucose A1C annually Discussed disease and risks Discussed diet/exercise, weight management  - CPM/GFR  TMJ Improved; monitor   Morbid obesity (HCC) - BMI 35 with hld, htn *** Long discussion about weight loss, diet, and exercise Recommended diet heavy in fruits and veggies and low in animal meats, cheeses, and dairy products, appropriate calorie intake Discussed appropriate weight for height Follow up at next visit  Hepatomegaly Weight loss encouraged, monitor LFTs *** GI  Atypical chest pain/ family hx of cardiovascular disease Has had normal EKGs, non-exertional though does have increased dyspnea  Low risk myoview 02/2010 Recent negative serial troponins during episode, felt atypical  However high risk, smoker, poor lifestyle, hld, family hx Patient very anxious; discussed CT coronary calcium score with low threshold  for referral back to cardiology *** Also emphasized risk reduction by lifestyle change and med Will control LDL <70 Reduce tobacco, caffeine;  Go to the ER if any chest pain, shortness of breath, nausea, dizziness, severe HA, changes vision/speech  Sedentary; deconditioning; increased risk of injury related to fall Following prolonged period of immobility; dicussed and PT referral placed per her preference ***  No orders of the defined types were placed in this encounter.   Over 40 minutes of exam, counseling, chart review and critical decision making was performed  Future Appointments  Date Time Provider Department Center  09/12/2021  2:00 PM Judd Gaudier, NP [facility] None  06/17/2022  2:30 PM Judd Gaudier, NP [facility] None  09/16/2022  2:00 PM Judd Gaudier, NP [facility] None     Plan:   During the course of the visit the patient was educated and counseled about appropriate screening and preventive services including:   Pneumococcal vaccine  Prevnar 13 Influenza vaccine Td vaccine Screening electrocardiogram Bone densitometry screening Colorectal cancer screening Diabetes screening Glaucoma screening Nutrition counseling  Advanced directives: requested    Subjective:  Susan Bartlett is a 55 y.o. presents for follow up and CPE. She has Hepatomegaly; Mixed hyperlipidemia; Panic disorder; Anal fissure; Depression, major, in remission (HCC); Hypothyroidism; Obesity (BMI 30-39.9); Vitamin D deficiency; Tobacco use disorder; TMJ (dislocation of temporomandibular joint); Labile hypertension; and Abnormal glucose on their problem list.  She reports has been very sedentary (barely getting out of bed) from 12/2019 after her niece passed until 12/20/2020, 1 year due to severe depression (follows with Dr. Jennelle Human who prescribes meds), has been working to increase activity by getting out and walk more but with weakness, was referred to PT ***  She was reporting episodes of  palpitations, had  normal labs, EKG on 02/20/2021. She presented to ED 05/09/2021 with chest pain, felt atypical, ? NSAID related (has since stopped), had negative serial troponins and EKG, had CTA unremarkable other than aortic atherosclerosis and central bronchia thickening. She was Dr. Eden Emms with low risk myoview in 2011. She remains very anxious about her cardiac risk. Significant family hx of CVD. CT coronary calcium was ordered bu still pending ***  She is a smoker, smoking 1.5 packs/day, since she was 55 years old, no mass or nodule was noted on CTA in 05/09/2021, we will plan to start annual low dose screening CT in 2023.   She has lumbar back pain, has seen Dr. Penni Bombard at [facility] ortho, no surgery was advised. Will follow up for flares, resolved with NSAID taper.   BMI is There is no height or weight on file to calculate BMI., she has not been working on diet and exercise except walking some laps around house in recent weeks.  She also drinks diet mountain dew, 12 pack diet, does 1/2 caffeine free  Wt Readings from Last 3 Encounters:  06/13/21 226 lb (102.5 kg)  06/06/21 227 lb 9.6 oz (103.2 kg)  05/09/21 200 lb (90.7 kg)   Today their BP is  . She denies dyspnea, dizziness. Has had chest pain episodes.   CTA 04/2021 showed mild aortic arch atherosclerosis.   She is on cholesterol medication, (switched from pravastatin 40 mg daily to rosuvastatin 5 mg last visit ***) and denies myalgias. Her cholesterol is not at goal. The cholesterol last visit was:   Lab Results  Component Value Date   CHOL 224 (H) 06/13/2021   HDL 49 (L) 06/13/2021   LDLCALC 136 (H) 06/13/2021   TRIG 256 (H) 06/13/2021   CHOLHDL 4.6 06/13/2021   Last Z6X in the office was:  Lab Results  Component Value Date   HGBA1C 5.2 02/20/2021   She has hx of vit D def. Patient was off of vitamin D last check, back to taking 09604 IU 1 once weekly.  Lab Results  Component Value Date   VD25OH 21 (L) 02/20/2021   She  is on thyroid medication, she is on 88 mcg 1 tab daily, on empty stomach, was taking irregularly, back to taking regularly.  *** Lab Results  Component Value Date   TSH 9.93 (H) 06/13/2021     Lab Results  Component Value Date   VITAMINB12 325 06/09/2017    Lab Results  Component Value Date   IRON 54 06/09/2017   TIBC 397 06/09/2017   FERRITIN 35 01/16/2015     Current Outpatient Medications  Medication Instructions   bacitracin 500 UNIT/GM ointment 1 application, Topical, 2 times daily   Cetirizine HCl 10 MG CAPS Oral   Cholecalciferol (VITAMIN D3) 1.25 MG (50000 UT) TABS 50,000 Int'l Units, Oral, Weekly   clonazePAM (KLONOPIN) 1 MG tablet TAKE 1/2 TABLET 3 TIMES DAILY,1 TABLET AT BEDTIME AND 1/2 TABLET DAILY AS NEEDED.   gabapentin (NEURONTIN) 100 MG capsule TAKE 1 TO 3 CAPSULES BY MOUTH AS NEEDED FOR  RESTLESS  LEGS   levothyroxine (SYNTHROID) 88 MCG tablet TAKE 1 TABLET BY MOUTH ONCE DAILY BEFORE BREAKFAST   mometasone (NASONEX) 50 MCG/ACT nasal spray 2 sprays, Nasal, Daily   rosuvastatin (CRESTOR) 5 MG tablet Start by taking 1 tab every other night, then if tolerating well in 1 month slowly increase up to taking daily for cholesterol.   sertraline (ZOLOFT) 250 mg, Oral, Daily   sodium chloride (OCEAN)  0.65 % SOLN nasal spray 1 spray, Each Nare, As needed     Current Problems (verified) Patient Active Problem List   Diagnosis Date Noted   Abnormal glucose 11/24/2018   Labile hypertension 03/05/2018   Tobacco use disorder 01/16/2015   TMJ (dislocation of temporomandibular joint) 01/16/2015   Vitamin D deficiency    Hepatomegaly    Mixed hyperlipidemia    Panic disorder    Anal fissure    Depression, major, in remission (HCC)    Hypothyroidism    Obesity (BMI 30-39.9)     Immunization History  Administered Date(s) Administered   Influenza Split 08/01/2013   Td 11/18/2012   Preventative care: Tetanus: 11/2012 Pneumovax: declines Pneum 20: declines Flu  vaccines declines Shingrix: will consider *** Covid 19: declines   Pap:  10/2019, Dr. Hyacinth Meeker MGM: 06/06/2021 DEXA: 2008, heel neg  Colonoscopy: 11/2014 due 10 years EGD: N/A  PFT: 01/2013 Stress test: 02/2010 Eden Emms)  CTA chest: 05/09/2021, no nodules, does show mild aortic arch atherosclerosis   Names of Other Physician/Practitioners you currently use: 1. [facility] here for primary care 2.  [facility], eye doctor, last visit 1999 3. Dr. Lequita Asal , dentist, last 2022, goes q52m   Patient Care Team: Lucky Cowboy, MD as PCP - General (Internal Medicine) Wendall Stade, MD as Consulting Physician (Cardiology) Iva Boop, MD as Consulting Physician (Gastroenterology) Jerene Bears, MD as Consulting Physician (Gynecology) Cottle, Steva Ready., MD as Attending Physician (Psychiatry)    Allergies No Known Allergies  SURGICAL HISTORY She  has a past surgical history that includes Hemorroidectomy; Cholecystectomy; Left oophorectomy; and Tonsillectomy. FAMILY HISTORY Her family history includes Alcohol abuse in her sister; Breast cancer in her cousin and mother; Cirrhosis in her father; Colon cancer (age of onset: 51) in her maternal grandmother; Diabetes in her mother; Drug abuse in her brother; Heart disease in her father. SOCIAL HISTORY She  reports that she has been smoking cigarettes. She started smoking about 32 years ago. She has a 32.00 pack-year smoking history. She has never used smokeless tobacco. She reports current alcohol use. She reports that she does not use drugs.  MEDICARE WELLNESS OBJECTIVES: Physical activity:   Cardiac risk factors:   Depression/mood screen:   Depression screen [facility] 2/9 06/06/2021  Decreased Interest 0  Down, Depressed, Hopeless 0  PHQ - 2 Score 0  Altered sleeping -  Tired, decreased energy -  Change in appetite -  Feeling bad or failure about yourself  -  Trouble concentrating -   Moving slowly or fidgety/restless -  Suicidal thoughts -  PHQ-9 Score -  Difficult doing work/chores -    ADLs:  In your present state of health, do you have any difficulty performing the following activities: 06/13/2021  Hearing? N  Vision? N  Difficulty concentrating or making decisions? N  Walking or climbing stairs? Y  Comment more unsteady gait  Dressing or bathing? N  Doing errands, shopping? N  Some recent data might be hidden     Cognitive Testing  Alert? Yes  Normal Appearance?Yes  Oriented to person? Yes  Place? Yes   Time? Yes  Recall of three objects?  Yes  Can perform simple calculations? Yes  Displays appropriate judgment?Yes  Can read the correct time from a watch face?Yes  EOL planning:       Review of Systems  Constitutional:  Negative for chills, fever, malaise/fatigue and weight loss.  HENT:  Negative  for congestion, ear pain, hearing loss, sore throat and tinnitus.   Eyes:  Negative for blurred vision and double vision.  Respiratory:  Positive for shortness of breath. Negative for cough, sputum production and wheezing.   Cardiovascular:  Positive for palpitations. Negative for chest pain, orthopnea, claudication and leg swelling.  Gastrointestinal:  Negative for abdominal pain, blood in stool, constipation, diarrhea, heartburn, melena, nausea and vomiting.  Genitourinary: Negative.   Musculoskeletal:  Positive for falls. Negative for joint pain and myalgias.  Skin:  Negative for rash.  Neurological:  Negative for dizziness, tingling, sensory change, weakness and headaches.  Endo/Heme/Allergies:  Negative for polydipsia.  Psychiatric/Behavioral:  Positive for depression. Negative for hallucinations, substance abuse and suicidal ideas. The patient is nervous/anxious. The patient does not have insomnia.   All other systems reviewed and are negative.  Objective:     Last menstrual period 08/04/2012. There is no height or weight on file to calculate  BMI.  General appearance: alert, no distress, WD/WN, female HEENT: normocephalic, sclerae anicteric, TMs pearly, nares patent, no discharge or erythema, pharynx normal Oral cavity: MMM, no lesions Neck: supple, no lymphadenopathy, no thyromegaly, no masses Heart: RRR, normal S1, S2, no murmurs Lungs: CTA bilaterally, no wheezes, rhonchi, or rales Abdomen: +bs, soft, non tender, non distended, no masses, no hepatomegaly, no splenomegaly Musculoskeletal: nontender, no swelling, no obvious deformity Extremities: no edema, no cyanosis, no clubbing Pulses: 2+ symmetric, upper and lower extremities, normal cap refill Neurological: alert, oriented x 3, CN2-12 intact, strength normal upper extremities and lower extremities, sensation normal throughout, normal gait Psychiatric: mildly anxious affect, behavior normal, pleasant     Medicare Attestation I have personally reviewed: The patient's medical and social history Their use of alcohol, tobacco or illicit drugs Their current medications and supplements The patient's functional ability including ADLs,fall risks, home safety risks, cognitive, and hearing and visual impairment Diet and physical activities Evidence for depression or mood disorders  The patient's weight, height, BMI, and visual acuity have been recorded in the chart.  I have made referrals, counseling, and provided education to the patient based on review of the above and I have provided the patient with a written personalized care plan for preventive services.     Dan Maker, NP   09/12/2021

## 2021-09-23 ENCOUNTER — Encounter: Payer: Self-pay | Admitting: Adult Health

## 2021-09-26 ENCOUNTER — Encounter (HOSPITAL_BASED_OUTPATIENT_CLINIC_OR_DEPARTMENT_OTHER): Payer: Self-pay | Admitting: Obstetrics & Gynecology

## 2021-09-27 ENCOUNTER — Other Ambulatory Visit (HOSPITAL_BASED_OUTPATIENT_CLINIC_OR_DEPARTMENT_OTHER): Payer: Self-pay | Admitting: *Deleted

## 2021-09-27 MED ORDER — VITAMIN D3 1.25 MG (50000 UT) PO TABS: 50000.0000 [IU] | ORAL_TABLET | ORAL | 1 refills | Status: DC

## 2021-10-30 NOTE — Progress Notes (Signed)
CPE  Assessment:   Encounter for Annual Physical Exam with abnormal findings Due annually  Health Maintenance reviewed Healthy lifestyle reviewed and goals set - declines all vaccines today, will consider shingrix due to hx of shingles, will check with insurance and can get at pharmacy    Hypothyroidism, unspecified hypothyroidism type -check TSH level, continue medications the same, reminded to take on an empty stomach 30-3360mins before food.  - TSH  Mixed hyperlipidemia -med anxiety  - check lipids, decrease fatty foods, increase activity. 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   - start lung cancer screening 04/2022 with low dose CT scan[facility]  Other abnormal glucose A1C annually Discussed disease and risks Discussed diet/exercise, weight management  - CPM/GFR  TMJ Improved; monitor   Morbid obesity (HCC) - BMI 35 with hld, htn Long discussion about weight loss, diet, and exercise Recommended diet heavy in fruits and veggies and low in animal meats, cheeses, and dairy products, appropriate calorie intake Discussed appropriate weight for height Follow up at next visit  Hepatomegaly Weight loss encouraged, monitor LFTs  Tongue coating No pain, try brushing tongue regularly with toothbrush and scraper, salt water gargles, reduce smoking if able If persistent in 1 week contact office can try nystatin  liquid  Dyspnea with minimal exertion With strong family hx of CVA, will refer to cardiology for workup If negative check PFTs, consider inhalers Has been very sedentary, deconditioning + anxiety likely contributing  Close follow up after cardiology evaluation  Orders Placed This Encounter  Procedures   Urine Culture   CBC with Differential/Platelet   COMPLETE METABOLIC PANEL WITH GFR   Magnesium   Lipid panel   TSH   Hemoglobin A1c   VITAMIN D 25 Hydroxy (Vit-D Deficiency, Fractures)   Vitamin B12   Microalbumin / creatinine urine ratio   Urinalysis, Routine w reflex microscopic   Ambulatory referral to Cardiology   EKG 12-Lead      Over 40 minutes of exam, counseling, chart review and critical decision making was performed  Future Appointments  Date Time Provider Department Center  06/17/2022  2:30 PM Judd Gaudierorbett, Kelli Robeck, NP [facility] None  11/04/2022  2:00 PM Judd Gaudierorbett, Jabree Pernice, NP [facility] None     Plan:   During the course of the visit the patient was educated and counseled about appropriate screening and preventive services including:   Pneumococcal vaccine  Prevnar 13 Influenza vaccine Td vaccine Screening electrocardiogram Bone densitometry screening Colorectal cancer screening Diabetes screening Glaucoma screening Nutrition counseling  Advanced directives: requested    Subjective:  Susan Bartlett is a 56 y.o. presents for follow up and CPE. She has Hepatomegaly; Mixed hyperlipidemia; Panic disorder; Anal fissure; Depression, major, in remission (HCC); Hypothyroidism; Morbid obesity (HCC) - BMI 35+ with htn, hld; Vitamin D deficiency; Tobacco use disorder; TMJ (dislocation of temporomandibular joint); Labile hypertension; Abnormal glucose; B12 deficiency; and Aortic arch atherosclerosis (HCC) - per CTA 04/2021 on their problem list.  She  is very anxious today -   She reported was very sedentary (barely getting out of bed) from 12/2019 after her niece passed  until 12/20/2020, 1 year due to severe depression and panic disorder (follows with Dr. Jennelle Humanottle who prescribes meds, currently taking sertraline 250 mg daily, added wellbutrin but hasn't started due to anxiety about new medication), has been working to increase activity by getting out and walk more however immediately has shortness of breath with minimal exertion and becomes anxious about her heart or lungs and has to stop.   She was reporting episodes of palpitations, had normal labs, EKG on 02/20/2021. She presented to ED 05/09/2021 with chest pain, felt atypical, ? NSAID related (has since stopped), had negative serial troponins and EKG, had CTA unremarkable other than aortic atherosclerosis and central bronchia thickening. She was Dr. Eden EmmsNishan with low risk myoview in 2011 but has not followed up since. She remains very anxious about her cardiac risk. Significant family hx of CVD.   She is a smoker, was smoking 1.5 packs/day, down to 1 pack/day, since she was 56 years old, no mass or nodule was noted on CTA in 05/09/2021, we will plan to start annual low dose screening CT in 04/2022.   She has lumbar back pain, [facility], no surgery was advised. Will follow up for flares, resolves with NSAID taper.   She reports last week had tender red bumps/ulcers on palate of mouth, has improved significantly but still occasionally burns.   BMI is Body mass index is 36.03 kg/m., she has not been working on diet and exercise except walking some laps around house in recent weeks.  She also drinks diet mountain dew, 12 pack diet, does 1/2 caffeine free  Wt Readings from Last 3 Encounters:  10/31/21 226 lb 9.6 oz (102.8 kg)  06/13/21 226 lb (102.5 kg)  06/06/21 227 lb 9.6 oz (103.2 kg)   Today their BP is BP: 140/88. She denies dyspnea, dizziness. Has had chest pain episodes.   She is on cholesterol medication, pravastatin 40 mg daily was above goal, last visit switched to rosuvastatin 5  mg daily but fear of medication and never started, receptive to start after discussion today. Her cholesterol is not at goal. The cholesterol last visit was:   Lab Results  Component Value Date   CHOL 224 (H) 06/13/2021   HDL 49 (L) 06/13/2021   LDLCALC 136 (H) 06/13/2021   TRIG 256 (H) 06/13/2021   CHOLHDL 4.6 06/13/2021   Last Z6XA1C in the office was:  Lab Results  Component Value Date   HGBA1C 5.2 02/20/2021   She has hx of vit D def. Patient was off of vitamin D last check, back to taking [id number] IU 1 once weekly.  Lab Results  Component Value Date   VD25OH 21 (L) 02/20/2021   She is on thyroid medication, she is on 88 mcg 1 tab daily, on empty stomach, was taking irregularly, back to taking regularly.  Lab Results  Component Value Date   TSH 9.93 (H) 06/13/2021   Lab Results  Component Value Date   VITAMINB12 325 06/09/2017     Current Outpatient Medications  Medication Instructions   bacitracin 500 UNIT/GM ointment 1 application, Topical, 2 times daily   Cetirizine HCl 10 MG CAPS Take by mouth.   Cholecalciferol (VITAMIN D3) 1.25 MG (50000 UT) TABS 50,000 Int'l Units, Oral, Weekly   clonazePAM (KLONOPIN) 1 MG tablet TAKE 1/2 TABLET 3 TIMES DAILY,1 TABLET  AT BEDTIME AND 1/2 TABLET DAILY AS NEEDED.   gabapentin (NEURONTIN) 100 MG capsule TAKE 1 TO 3 CAPSULES BY MOUTH AS NEEDED FOR  RESTLESS  LEGS   levothyroxine (SYNTHROID) 88 MCG tablet TAKE 1 TABLET BY MOUTH ONCE DAILY BEFORE BREAKFAST   mometasone (NASONEX) 50 MCG/ACT nasal spray 2 sprays, Nasal, Daily   rosuvastatin (CRESTOR) 5 MG tablet Start by taking 1 tab every other night, then if tolerating well in 1 month slowly increase up to taking daily for cholesterol.   sertraline (ZOLOFT) 250 mg, Oral, Daily   sodium chloride (OCEAN) 0.65 % SOLN nasal spray 1 spray, Each Nare, As needed     Current Problems (verified) Patient Active Problem List   Diagnosis Date Noted   B12 deficiency 09/12/2021   Aortic arch  atherosclerosis (HCC) - per CTA 04/2021 09/12/2021   Abnormal glucose 11/24/2018   Labile hypertension 03/05/2018   Tobacco use disorder 01/16/2015   TMJ (dislocation of temporomandibular joint) 01/16/2015   Vitamin D deficiency    Hepatomegaly    Mixed hyperlipidemia    Panic disorder    Anal fissure    Depression, major, in remission (HCC)    Hypothyroidism    Morbid obesity (HCC) - BMI 35+ with htn, hld     Immunization History  Administered Date(s) Administered   Influenza Split 08/01/2013   Td 11/18/2012   Health Maintenance  Topic Date Due   Zoster Vaccines- Shingrix (1 of 2) Never done   COVID-19 Vaccine (1) 11/16/2021 (Originally 05/06/1966)   INFLUENZA VACCINE  01/10/2022 (Originally 05/13/2021)   PAP SMEAR-Modifier  10/16/2022   TETANUS/TDAP  11/18/2022   MAMMOGRAM  06/07/2023   COLONOSCOPY (Pts 45-54yrs Insurance coverage will need to be confirmed)  11/17/2024   HIV Screening  Completed   HPV VACCINES  Aged Out   Hepatitis C Screening  Discontinued    PFT: 01/2013 Stress test: 02/2010 Eden Emms)  CTA chest: 05/09/2021, no nodules, aortic atherosclerosis   Names of Other Physician/Practitioners you currently use: 1. [facility] here for primary care 2.  [facility], eye doctor, last visit 1999, encouraged to schedule 3. Dr. Lequita Asal , dentist, last 2022, goes q67m   Patient Care Team: Lucky Cowboy, MD as PCP - General (Internal Medicine) Wendall Stade, MD as Consulting Physician (Cardiology) Iva Boop, MD as Consulting Physician (Gastroenterology) Jerene Bears, MD as Consulting Physician (Gynecology) Cottle, Steva Ready., MD as Attending Physician (Psychiatry)    Allergies No Known Allergies  SURGICAL HISTORY She  has a past surgical history that includes Hemorroidectomy; Cholecystectomy; Left oophorectomy; and Tonsillectomy. FAMILY HISTORY Her family history includes Alcohol abuse in her  sister; Breast cancer in her cousin and mother; Cirrhosis in her father; Colon cancer (age of onset: 51) in her maternal grandmother; Diabetes in her mother; Drug abuse in her brother; Heart disease in her father. SOCIAL HISTORY She  reports that she has been smoking cigarettes. She started smoking about 33 years ago. She has a 32.00 pack-year smoking history. She has never used smokeless tobacco. She reports current alcohol use. She reports that she does not use drugs.    Review of Systems  Constitutional:  Negative for chills, fever, malaise/fatigue and weight loss.  HENT:  Negative for congestion, ear pain, hearing loss, sore throat and tinnitus.   Eyes:  Negative for blurred vision and double vision.  Respiratory:  Positive for shortness of breath. Negative for cough, sputum production and wheezing.  Cardiovascular:  Positive for palpitations. Negative for chest pain, orthopnea, claudication and leg swelling.  Gastrointestinal:  Negative for abdominal pain, blood in stool, constipation, diarrhea, heartburn, melena, nausea and vomiting.  Genitourinary: Negative.   Musculoskeletal:  Negative for falls, joint pain and myalgias.  Skin:  Negative for rash.  Neurological:  Negative for dizziness, tingling, sensory change, weakness and headaches.  Endo/Heme/Allergies:  Negative for polydipsia.  Psychiatric/Behavioral:  Positive for depression. Negative for hallucinations, substance abuse and suicidal ideas. The patient is nervous/anxious. The patient does not have insomnia.   All other systems reviewed and are negative.  Objective:     Blood pressure 140/88, pulse 68, temperature 97.6 F (36.4 C), resp. rate 16, height 5' 6.5" (1.689 m), weight 226 lb 9.6 oz (102.8 kg), last menstrual period 08/04/2012, SpO2 96 %. Body mass index is 36.03 kg/m.  General appearance: alert, no distress, WD/WN, female HEENT: normocephalic, sclerae anicteric, TMs pearly, nares patent, no discharge or erythema,  pharynx normal. Soft palate without significant lesion or ulceration, tongue with some white/brown coating.  Oral cavity: MMM, no lesions Neck: supple, no lymphadenopathy, no thyromegaly, no masses Heart: RRR, normal S1, S2, no murmurs Lungs: CTA bilaterally, no wheezes, rhonchi, or rales Abdomen: +bs, soft, non tender, non distended, no masses, no hepatomegaly, no splenomegaly Musculoskeletal: nontender, no swelling, no obvious deformity Extremities: no edema, no cyanosis, no clubbing Pulses: 2+ symmetric, upper and lower extremities, normal cap refill Neurological: alert, oriented x 3, CN2-12 intact, strength normal upper extremities and lower extremities, sensation normal throughout, normal gait Psychiatric: very anxious affect, non-pressured speech, behavior normal, pleasant  Breasts: breasts appear normal, no suspicious masses, no skin or nipple changes or axillary nodes.  EKG: NSR, NSCPT   Dan Maker, NP   10/31/2021

## 2021-10-31 ENCOUNTER — Encounter: Payer: Self-pay | Admitting: Adult Health

## 2021-10-31 ENCOUNTER — Other Ambulatory Visit: Payer: Self-pay

## 2021-10-31 ENCOUNTER — Ambulatory Visit (INDEPENDENT_AMBULATORY_CARE_PROVIDER_SITE_OTHER): Payer: Medicare Other | Admitting: Adult Health

## 2021-10-31 VITALS — BP 140/88 | HR 68 | Temp 97.6°F | Resp 16 | Ht 66.5 in | Wt 226.6 lb

## 2021-10-31 DIAGNOSIS — R0989 Other specified symptoms and signs involving the circulatory and respiratory systems: Secondary | ICD-10-CM

## 2021-10-31 DIAGNOSIS — F410 Panic disorder [episodic paroxysmal anxiety] without agoraphobia: Secondary | ICD-10-CM

## 2021-10-31 DIAGNOSIS — E538 Deficiency of other specified B group vitamins: Secondary | ICD-10-CM

## 2021-10-31 DIAGNOSIS — Z136 Encounter for screening for cardiovascular disorders: Secondary | ICD-10-CM

## 2021-10-31 DIAGNOSIS — Z0001 Encounter for general adult medical examination with abnormal findings: Secondary | ICD-10-CM

## 2021-10-31 DIAGNOSIS — F325 Major depressive disorder, single episode, in full remission: Secondary | ICD-10-CM

## 2021-10-31 DIAGNOSIS — E782 Mixed hyperlipidemia: Secondary | ICD-10-CM | POA: Diagnosis not present

## 2021-10-31 DIAGNOSIS — R300 Dysuria: Secondary | ICD-10-CM

## 2021-10-31 DIAGNOSIS — R160 Hepatomegaly, not elsewhere classified: Secondary | ICD-10-CM

## 2021-10-31 DIAGNOSIS — F17200 Nicotine dependence, unspecified, uncomplicated: Secondary | ICD-10-CM

## 2021-10-31 DIAGNOSIS — Z1389 Encounter for screening for other disorder: Secondary | ICD-10-CM

## 2021-10-31 DIAGNOSIS — Z0000 Encounter for general adult medical examination without abnormal findings: Secondary | ICD-10-CM

## 2021-10-31 DIAGNOSIS — I700 Atherosclerosis of aorta: Secondary | ICD-10-CM

## 2021-10-31 DIAGNOSIS — E559 Vitamin D deficiency, unspecified: Secondary | ICD-10-CM

## 2021-10-31 DIAGNOSIS — R7309 Other abnormal glucose: Secondary | ICD-10-CM | POA: Diagnosis not present

## 2021-10-31 DIAGNOSIS — R0609 Other forms of dyspnea: Secondary | ICD-10-CM | POA: Diagnosis not present

## 2021-10-31 DIAGNOSIS — E039 Hypothyroidism, unspecified: Secondary | ICD-10-CM

## 2021-10-31 DIAGNOSIS — Z131 Encounter for screening for diabetes mellitus: Secondary | ICD-10-CM

## 2021-11-01 ENCOUNTER — Other Ambulatory Visit: Payer: Self-pay | Admitting: Adult Health

## 2021-11-01 ENCOUNTER — Encounter: Payer: Self-pay | Admitting: Adult Health

## 2021-11-01 DIAGNOSIS — R7989 Other specified abnormal findings of blood chemistry: Secondary | ICD-10-CM

## 2021-11-01 LAB — URINE CULTURE
MICRO NUMBER:: 12893049
SPECIMEN QUALITY:: ADEQUATE

## 2021-11-01 LAB — COMPLETE METABOLIC PANEL WITH GFR
AG Ratio: 2.3 (calc) (ref 1.0–2.5)
ALT: 17 U/L (ref 6–29)
AST: 21 U/L (ref 10–35)
Albumin: 4.6 g/dL (ref 3.6–5.1)
Alkaline phosphatase (APISO): 82 U/L (ref 37–153)
BUN: 7 mg/dL (ref 7–25)
CO2: 29 mmol/L (ref 20–32)
Calcium: 9.9 mg/dL (ref 8.6–10.4)
Chloride: 104 mmol/L (ref 98–110)
Creat: 0.73 mg/dL (ref 0.50–1.03)
Globulin: 2 g/dL (calc) (ref 1.9–3.7)
Glucose, Bld: 89 mg/dL (ref 65–99)
Potassium: 4.6 mmol/L (ref 3.5–5.3)
Sodium: 138 mmol/L (ref 135–146)
Total Bilirubin: 0.4 mg/dL (ref 0.2–1.2)
Total Protein: 6.6 g/dL (ref 6.1–8.1)
eGFR: 96 mL/min/{1.73_m2} (ref >=60)

## 2021-11-01 LAB — CBC WITH DIFFERENTIAL/PLATELET
Absolute Monocytes: 439 cells/uL (ref 200–950)
Basophils Absolute: 58 cells/uL (ref 0–200)
Basophils Relative: 0.8 %
Eosinophils Absolute: 425 cells/uL (ref 15–500)
Eosinophils Relative: 5.9 %
HCT: 41.6 % (ref 35.0–45.0)
Hemoglobin: 14.2 g/dL (ref 11.7–15.5)
Lymphs Abs: 1476 cells/uL (ref 850–3900)
MCH: 31.1 pg (ref 27.0–33.0)
MCHC: 34.1 g/dL (ref 32.0–36.0)
MCV: 91.2 fL (ref 80.0–100.0)
MPV: 11.2 fL (ref 7.5–12.5)
Monocytes Relative: 6.1 %
Neutro Abs: 4802 cells/uL (ref 1500–7800)
Neutrophils Relative %: 66.7 %
Platelets: 264 10*3/uL (ref 140–400)
RBC: 4.56 10*6/uL (ref 3.80–5.10)
RDW: 13.4 % (ref 11.0–15.0)
Total Lymphocyte: 20.5 %
WBC: 7.2 10*3/uL (ref 3.8–10.8)

## 2021-11-01 LAB — URINALYSIS, ROUTINE W REFLEX MICROSCOPIC
Bilirubin Urine: NEGATIVE
Glucose, UA: NEGATIVE
Hgb urine dipstick: NEGATIVE
Ketones, ur: NEGATIVE
Leukocytes,Ua: NEGATIVE
Nitrite: NEGATIVE
Protein, ur: NEGATIVE
Specific Gravity, Urine: 1.011 (ref 1.001–1.035)
pH: 5.5 (ref 5.0–8.0)

## 2021-11-01 LAB — VITAMIN D 25 HYDROXY (VIT D DEFICIENCY, FRACTURES): Vit D, 25-Hydroxy: 23 ng/mL — ABNORMAL LOW (ref 30–100)

## 2021-11-01 LAB — LIPID PANEL
Cholesterol: 196 mg/dL (ref <200)
HDL: 45 mg/dL — ABNORMAL LOW (ref >=50)
LDL Cholesterol (Calc): 125 mg/dL (calc) — ABNORMAL HIGH
Non-HDL Cholesterol (Calc): 151 mg/dL (calc) — ABNORMAL HIGH (ref <130)
Total CHOL/HDL Ratio: 4.4 (calc) (ref <5.0)
Triglycerides: 148 mg/dL (ref <150)

## 2021-11-01 LAB — TSH: TSH: 9.24 mIU/L — ABNORMAL HIGH (ref 0.40–4.50)

## 2021-11-01 LAB — MICROALBUMIN / CREATININE URINE RATIO
Creatinine, Urine: 83 mg/dL (ref 20–275)
Microalb Creat Ratio: 6 mcg/mg creat (ref <30)
Microalb, Ur: 0.5 mg/dL

## 2021-11-01 LAB — HEMOGLOBIN A1C
Hgb A1c MFr Bld: 5.3 % of total Hgb (ref <5.7)
Mean Plasma Glucose: 105 mg/dL
eAG (mmol/L): 5.8 mmol/L

## 2021-11-01 LAB — VITAMIN B12: Vitamin B-12: 248 pg/mL (ref 200–1100)

## 2021-11-01 LAB — MAGNESIUM: Magnesium: 2 mg/dL (ref 1.5–2.5)

## 2021-11-01 MED ORDER — LEVOTHYROXINE SODIUM 88 MCG PO TABS: ORAL_TABLET | ORAL | 0 refills | Status: DC

## 2021-12-03 NOTE — Progress Notes (Signed)
CARDIOLOGY CONSULT NOTE       Patient ID: TRULA FREDE MRN: [id number] DOB/AGE: June 23, 1966 56 y.o.  Admit date: (Not on file) Referring Physician: Judd Gaudier NP Primary Physician: Lucky Cowboy, MD Primary Cardiologist: New Reason for Consultation: Dyspnea  Active Problems:   * No active hospital problems. *   HPI:  56 y.o. referred by [facility] NP for dyspnea History of Anxiety/Depression, hypothyroidism, HLD Ongoing smoking 35 pack year to have CT July 2023 for screening She is obese with BMI 35 She is sedentary with deconditioning and anxiety playing role in dyspnea. Had CTA 05/10/21 for chest/back pain negative dissection noted chronic bronchial thickening from smoking No history of CAD, CHF, arrhythmia or valve disease Has not had PFTls   She has significant anxiety seeing medical and psych counselors. Started on Welbutrin for smoking cessation and has cut back Has lost a lot of relatives over the last few years and is on disability for her panic attacks and anxiety.   She use to go to school at [employer] with my wife   She has had multiple family members with premature CAD She is concerned about having a heart attack and gets SSCP at times Related to her anxiety She is scared to be more active  Due to this and her dyspnea     ROS All other systems reviewed and negative except as noted above  Past Medical History:  Diagnosis Date   Anal fissure 1994   Anxiety    Arthritis    Dr. Penni [facility]ard at [facility]   Depression    Endometriosis    Fracture 08/2011   fracture left wrist and elbow   Hemorrhoids 2008   Hepatomegaly    Hypothyroidism    Mixed hyperlipidemia    Obesity    OCD (obsessive compulsive disorder)    Panic disorder    Plantar fasciitis, right 03/2014   Prediabetes 01/16/2015   RLS (restless legs syndrome) 2014   vis sleep study- no sleep apnea   Shingles 07/2017   Tobacco abuse    not tolerated Chantix in the past   Vitamin D deficiency      Family History  Problem Relation Age of Onset   Breast cancer Mother    Diabetes Mother    Drug abuse Brother        and alcohol   Cirrhosis Father        alcohol   Heart disease Father        MI   Colon cancer Maternal Grandmother 50   Alcohol abuse Sister    Breast cancer Cousin    Thyroid disease Neg Hx     Social History   Socioeconomic History   Marital status: Single    Spouse name: Not on file   Number of children: 1   Years of education: Not on file   Highest education level: Not on file  Occupational History   Occupation: Agricultural consultant: [employer]  Tobacco Use   Smoking status: Every Day    Packs/day: 1.00    Years: 32.00    Pack years: 32.00    Types: Cigarettes    Start date: 1990   Smokeless tobacco: Never  Vaping Use   Vaping Use: Never used  Substance and Sexual Activity   Alcohol use: Yes    Alcohol/week: 0.0 standard drinks    Comment: occ glass of wine   Drug use: No   Sexual activity: Not Currently  Birth control/protection: Post-menopausal  Other Topics Concern   Not on file  Social History Narrative   Not on file   Social Determinants of Health   Financial Resource Strain: Not on file  Food Insecurity: Not on file  Transportation Needs: Not on file  Physical Activity: Not on file  Stress: Not on file  Social Connections: Not on file  Intimate Partner Violence: Not on file    Past Surgical History:  Procedure Laterality Date   CHOLECYSTECTOMY     HEMORROIDECTOMY     LEFT OOPHORECTOMY     TONSILLECTOMY        Current Outpatient Medications:    Cetirizine HCl 10 MG CAPS, Take by mouth., Disp: , Rfl:    Cholecalciferol (VITAMIN D3) 1.25 MG (50000 UT) TABS, Take 50,000 Int'l Units by mouth once a week., Disp: 8 tablet, Rfl: 1   clonazePAM (KLONOPIN) 1 MG tablet, TAKE 1/2 TABLET 3 TIMES DAILY,1 TABLET AT BEDTIME AND 1/2 TABLET DAILY AS NEEDED., Disp: 90 tablet, Rfl: 1   gabapentin (NEURONTIN) 100 MG capsule, TAKE 1 TO 3  CAPSULES BY MOUTH AS NEEDED FOR  RESTLESS  LEGS, Disp: 90 capsule, Rfl: 0   levothyroxine (SYNTHROID) 88 MCG tablet, Take 1.5 tabs on Monday and Thursday, take 1 tab all other days. Take on an empty stomach first thing in the morning with water only, no other foods/drinks/meds for at least 30 min., Disp: 102 tablet, Rfl: 0   mometasone (NASONEX) 50 MCG/ACT nasal spray, Place 2 sprays into the nose daily., Disp: , Rfl:    sertraline (ZOLOFT) 100 MG tablet, Take 2.5 tablets (250 mg total) by mouth daily., Disp: 225 tablet, Rfl: 1   sodium chloride (OCEAN) 0.65 % SOLN nasal spray, Place 1 spray into both nostrils as needed for congestion., Disp: , Rfl:    bacitracin 500 UNIT/GM ointment, Apply 1 application topically 2 (two) times daily. (Patient not taking: Reported on 12/06/2021), Disp: 15 g, Rfl: 0   pravastatin (PRAVACHOL) 40 MG tablet, Take 40 mg by mouth at bedtime., Disp: , Rfl:     Physical Exam: Blood pressure 112/62, pulse 88, height 5\' 8"  (1.727 m), weight 224 lb (101.6 kg), last menstrual period 08/04/2012, SpO2 99 %.   Affect appropriate Obese female  HEENT: normal Neck supple with no adenopathy JVP normal no bruits no thyromegaly Lungs clear with no wheezing and good diaphragmatic motion Heart:  S1/S2 no murmur, no rub, gallop or click PMI normal Abdomen: benighn, BS positve, no tenderness, no AAA no bruit.  No HSM or HJR Distal pulses intact with no bruits No edema Neuro non-focal Skin warm and dry No muscular weakness   Labs:   Lab Results  Component Value Date   WBC 7.2 10/31/2021   HGB 14.2 10/31/2021   HCT 41.6 10/31/2021   MCV 91.2 10/31/2021   PLT 264 10/31/2021   No results for input(s): NA, K, CL, CO2, BUN, CREATININE, CALCIUM, PROT, BILITOT, ALKPHOS, ALT, AST, GLUCOSE in the last 168 hours.  Invalid input(s): LABALBU Lab Results  Component Value Date   CKTOTAL 46 03/15/2010   CKMB 1.1 03/15/2010   TROPONINI 0.02        NO INDICATION OF MYOCARDIAL  INJURY. 03/15/2010    Lab Results  Component Value Date   CHOL 196 10/31/2021   CHOL 224 (H) 06/13/2021   CHOL 197 02/20/2021   Lab Results  Component Value Date   HDL 45 (L) 10/31/2021   HDL 49 (L) 06/13/2021  HDL 46 (L) 02/20/2021   Lab Results  Component Value Date   LDLCALC 125 (H) 10/31/2021   LDLCALC 136 (H) 06/13/2021   LDLCALC 116 (H) 02/20/2021   Lab Results  Component Value Date   TRIG 148 10/31/2021   TRIG 256 (H) 06/13/2021   TRIG 232 (H) 02/20/2021   Lab Results  Component Value Date   CHOLHDL 4.4 10/31/2021   CHOLHDL 4.6 06/13/2021   CHOLHDL 4.3 02/20/2021   No results found for: LDLDIRECT    Radiology: No results found.  EKG: SR rate 73 normal    ASSESSMENT AND PLAN:   Dyspnea:  related to obesity deconditioning and smoking  Baseline ECG normal and no murmurs on exam Smoking: counseled on cessation < 10 minutes f/u screening CT per primary July Suspect PFTls would be in order Pulmonary can order  Anxiety/Depression: continue Zoloft and Klonopin Hypothyroid:  on synthroid replacement TSH 9.24 10/31/21 consider increasing dose  HLD:  continue crestor discussed utility of calcium score LDL 125 would offer tighter control for high score CAD:  Risk with chest pain f/u cardiac CTA    Cardiac CTA   F/U cardiology PRN   Signed: Charlton Haws 12/06/2021, 2:49 PM

## 2021-12-06 ENCOUNTER — Encounter: Payer: Self-pay | Admitting: Cardiovascular Disease

## 2021-12-06 ENCOUNTER — Ambulatory Visit: Payer: Medicare Other | Admitting: Cardiovascular Disease

## 2021-12-06 ENCOUNTER — Other Ambulatory Visit: Payer: Self-pay

## 2021-12-06 VITALS — BP 112/62 | HR 88 | Ht 68.0 in | Wt 224.0 lb

## 2021-12-06 DIAGNOSIS — F411 Generalized anxiety disorder: Secondary | ICD-10-CM

## 2021-12-06 DIAGNOSIS — R072 Precordial pain: Secondary | ICD-10-CM

## 2021-12-06 DIAGNOSIS — E782 Mixed hyperlipidemia: Secondary | ICD-10-CM | POA: Diagnosis not present

## 2021-12-06 DIAGNOSIS — R0600 Dyspnea, unspecified: Secondary | ICD-10-CM

## 2021-12-06 MED ORDER — METOPROLOL TARTRATE 100 MG PO TABS: ORAL_TABLET | ORAL | 0 refills | Status: DC

## 2021-12-06 NOTE — Patient Instructions (Addendum)
Medication Instructions:  Your physician recommends that you continue on your current medications as directed. Please refer to the Current Medication list given to you today.  *If you need a refill on your cardiac medications before your next appointment, please call your pharmacy*  Lab Work: Your physician recommends that you have lab work today- BMET  If you have labs (blood work) drawn today and your tests are completely normal, you will receive your results only by: MyChart Message (if you have MyChart) OR A paper copy in the mail If you have any lab test that is abnormal or we need to change your treatment, we will call you to review the results.  Testing/Procedures: Your physician has requested that you have cardiac CT. Cardiac computed tomography (CT) is a painless test that uses an x-ray machine to take clear, detailed pictures of your heart. For further information please visit [email or web address].[email or web address]. Please follow instruction sheet as given.  Follow-Up: At [facility], you and your health needs are our priority.  As part of our continuing mission to provide you with exceptional heart care, we have created designated Provider Care Teams.  These Care Teams include your primary Cardiologist (physician) and Advanced Practice Providers (APPs -  Physician Assistants and Nurse Practitioners) who all work together to provide you with the care you need, when you need it.  We recommend signing up for the patient portal called "MyChart".  Sign up information is provided on this After Visit Summary.  MyChart is used to connect with patients for Virtual Visits (Telemedicine).  Patients are able to view lab/test results, encounter notes, upcoming appointments, etc.  Non-urgent messages can be sent to your provider as well.   To learn more about what you can do with MyChart, go to [email or web address].    Your next appointment:   As needed  The format for your next appointment:   In  Person  Provider:   Jenkins Rouge, MD {     Your cardiac CT will be scheduled at one of the below locations:   [facility] [address] [place], [place] [address] [phone]  If scheduled at [facility], please arrive at the [facility] (Entrance C2) of [facility] 30 minutes prior to test start time. You can use the FREE valet parking offered at entrance C (encouraged to control the heart rate for the test)  Proceed to the [facility] Radiology Department (first floor) to check-in and test prep.  All radiology patients and guests should use entrance C2 at [facility], accessed from [facility], even though the hospital's physical address listed is [address].    Please follow these instructions carefully (unless otherwise directed):   On the Night Before the Test: Be sure to Drink plenty of water. Do not consume any caffeinated/decaffeinated beverages or chocolate 12 hours prior to your test. Do not take any antihistamines 12 hours prior to your test.  On the Day of the Test: Drink plenty of water until 1 hour prior to the test. Do not eat any food 4 hours prior to the test. You may take your regular medications prior to the test.  Take metoprolol (Lopressor) 100 mg  two hours prior to test. FEMALES- please wear underwire-free bra if available, avoid dresses & tight clothing      After the Test: Drink plenty of water. After receiving IV contrast, you may experience a mild flushed feeling. This is normal. On occasion,  you may experience a mild rash up to 24 hours after the test. This is not dangerous. If this occurs, you can take Benadryl 25 mg and increase your fluid intake. If you experience trouble breathing, this can be serious. If it is severe call 911 IMMEDIATELY. If it is mild, please call our office.   We will call to schedule your test 2-4 weeks out understanding that some  insurance companies will need an authorization prior to the service being performed.   For non-scheduling related questions, please contact the cardiac imaging nurse navigator should you have any questions/concerns: Marchia Bond, Cardiac Imaging Nurse Navigator Gordy Clement, Cardiac Imaging Nurse Navigator [facility] Direct Office Dial: [phone]   For scheduling needs, including cancellations and rescheduling, please call [place], [phone].

## 2021-12-07 LAB — BASIC METABOLIC PANEL
BUN/Creatinine Ratio: 7 — ABNORMAL LOW (ref 9–23)
BUN: 5 mg/dL — ABNORMAL LOW (ref 6–24)
CO2: 24 mmol/L (ref 20–29)
Calcium: 9.9 mg/dL (ref 8.7–10.2)
Chloride: 100 mmol/L (ref 96–106)
Creatinine, Ser: 0.68 mg/dL (ref 0.57–1.00)
Glucose: 84 mg/dL (ref 70–99)
Potassium: 4.3 mmol/L (ref 3.5–5.2)
Sodium: 139 mmol/L (ref 134–144)
eGFR: 102 mL/min/{1.73_m2} (ref >59)

## 2021-12-13 ENCOUNTER — Other Ambulatory Visit: Payer: Self-pay | Admitting: Adult Health

## 2021-12-13 ENCOUNTER — Encounter: Payer: Self-pay | Admitting: Adult Health

## 2021-12-13 DIAGNOSIS — R0609 Other forms of dyspnea: Secondary | ICD-10-CM

## 2021-12-13 DIAGNOSIS — F17200 Nicotine dependence, unspecified, uncomplicated: Secondary | ICD-10-CM

## 2021-12-18 ENCOUNTER — Other Ambulatory Visit: Payer: Self-pay | Admitting: Adult Health

## 2021-12-18 MED ORDER — CLOTRIMAZOLE-BETAMETHASONE 1-0.05 % EX CREA: 1.0000 "application " | TOPICAL_CREAM | Freq: Two times a day (BID) | CUTANEOUS | 2 refills | Status: DC

## 2021-12-27 ENCOUNTER — Ambulatory Visit (HOSPITAL_BASED_OUTPATIENT_CLINIC_OR_DEPARTMENT_OTHER): Payer: Medicare Other | Admitting: Obstetrics & Gynecology

## 2021-12-27 ENCOUNTER — Telehealth (HOSPITAL_BASED_OUTPATIENT_CLINIC_OR_DEPARTMENT_OTHER): Payer: Self-pay | Admitting: Obstetrics & Gynecology

## 2021-12-27 NOTE — Telephone Encounter (Signed)
Called patient and left a message to please call the office about today's missed appointment. 

## 2021-12-31 ENCOUNTER — Encounter (HOSPITAL_BASED_OUTPATIENT_CLINIC_OR_DEPARTMENT_OTHER): Payer: Self-pay | Admitting: Obstetrics & Gynecology

## 2021-12-31 ENCOUNTER — Ambulatory Visit (HOSPITAL_BASED_OUTPATIENT_CLINIC_OR_DEPARTMENT_OTHER): Payer: Medicare Other | Admitting: Obstetrics & Gynecology

## 2021-12-31 ENCOUNTER — Other Ambulatory Visit: Payer: Self-pay

## 2021-12-31 VITALS — BP 142/75 | HR 93 | Ht 68.0 in | Wt 222.6 lb

## 2021-12-31 DIAGNOSIS — R234 Changes in skin texture: Secondary | ICD-10-CM

## 2022-01-02 ENCOUNTER — Telehealth (HOSPITAL_COMMUNITY): Payer: Self-pay | Admitting: *Deleted

## 2022-01-02 NOTE — Telephone Encounter (Signed)
Attempted to call patient regarding upcoming cardiac CT appointment. °Left message on voicemail with name and callback number ° °Edie Vallandingham RN Navigator Cardiac Imaging °[facility] °[phone] Office °[phone] Cell ° °

## 2022-01-03 ENCOUNTER — Telehealth (HOSPITAL_COMMUNITY): Payer: Self-pay | Admitting: Emergency Medicine

## 2022-01-03 ENCOUNTER — Telehealth (HOSPITAL_COMMUNITY): Payer: Self-pay | Admitting: *Deleted

## 2022-01-03 NOTE — Progress Notes (Signed)
GYNECOLOGY  VISIT ? ?CC:   breast skin change ? ?HPI: ?56 y.o. G1P1 Single White or Caucasian female here for complaint of erythematous skin that is circular and underneath left breast.  Has been present a few weeks.  Pt did see PCP who thought it was related to her bra and would resolve.  It has not.  It is not itchy.  There is no pain.  Denies recent trauma.  Has no nipple discharge.  When it didn't resolve, PCP gave her anti-fungal and steroid combination cream but label say to be careful of use on specific body regions.  Pt was unsure if should use and is here for my opinion today. ? ?Last MMG was  06/10/2021 and was normal. ? ?Unrelated--pt has coronary CT scheduled for next week.  She is nervous about this due to risk of sudden death.  D/w pt rarity of this risk and that I feel the test will give very useful information especially in light of hx of smoking.     ? ?Past Medical History:  ?Diagnosis Date  ? Anal fissure 1994  ? Anxiety   ? Arthritis   ? Dr. Penni Bombard at [facility]  ? Depression   ? Endometriosis   ? Fracture 08/2011  ? fracture left wrist and elbow  ? Hemorrhoids 2008  ? Hepatomegaly   ? Hypothyroidism   ? Mixed hyperlipidemia   ? Obesity   ? OCD (obsessive compulsive disorder)   ? Panic disorder   ? Plantar fasciitis, right 03/2014  ? Prediabetes 01/16/2015  ? RLS (restless legs syndrome) 2014  ? vis sleep study- no sleep apnea  ? Shingles 07/2017  ? Tobacco abuse   ? not tolerated Chantix in the past  ? Vitamin D deficiency   ? ? ?Past Surgical History:  ?Procedure Laterality Date  ? CHOLECYSTECTOMY    ? HEMORROIDECTOMY    ? LEFT OOPHORECTOMY    ? TONSILLECTOMY    ? ? ?MEDS:   ?Current Outpatient Medications on File Prior to Visit  ?Medication Sig Dispense Refill  ? Cholecalciferol (VITAMIN D3) 1.25 MG (50000 UT) TABS Take 50,000 Int'l Units by mouth once a week. 8 tablet 1  ? clonazePAM (KLONOPIN) 1 MG tablet TAKE 1/2 TABLET 3 TIMES DAILY,1 TABLET AT BEDTIME AND 1/2 TABLET DAILY AS  NEEDED. 90 tablet 1  ? clotrimazole-betamethasone (LOTRISONE) cream Apply 1 application. topically 2 (two) times daily. 15 g 2  ? gabapentin (NEURONTIN) 100 MG capsule TAKE 1 TO 3 CAPSULES BY MOUTH AS NEEDED FOR  RESTLESS  LEGS 90 capsule 0  ? levothyroxine (SYNTHROID) 88 MCG tablet Take 1.5 tabs on Monday and Thursday, take 1 tab all other days. Take on an empty stomach first thing in the morning with water only, no other foods/drinks/meds for at least 30 min. 102 tablet 0  ? metoprolol tartrate (LOPRESSOR) 100 MG tablet Take one tablet by mouth 2 hours prior to CT 1 tablet 0  ? pravastatin (PRAVACHOL) 40 MG tablet Take 40 mg by mouth at bedtime.    ? sertraline (ZOLOFT) 100 MG tablet Take 2.5 tablets (250 mg total) by mouth daily. 225 tablet 1  ? sodium chloride (OCEAN) 0.65 % SOLN nasal spray Place 1 spray into both nostrils as needed for congestion.    ? bacitracin 500 UNIT/GM ointment Apply 1 application topically 2 (two) times daily. (Patient not taking: Reported on 12/06/2021) 15 g 0  ? Cetirizine HCl 10 MG CAPS Take by mouth. (Patient not taking:  Reported on 12/31/2021)    ? mometasone (NASONEX) 50 MCG/ACT nasal spray Place 2 sprays into the nose daily. (Patient not taking: Reported on 12/31/2021)    ? ?No current facility-administered medications on file prior to visit.  ? ? ?ALLERGIES: Patient has no known allergies. ? ?SH:  single, smoker ? ?Review of Systems  ?Constitutional: Negative.   ? ?PHYSICAL EXAMINATION:   ? ?BP (!) 142/75 (BP Location: Left Arm, Patient Position: Sitting, Cuff Size: Large)   Pulse 93   Ht 5\' 8"  (1.727 m) Comment: Reported  Wt 222 lb 9.6 oz (101 kg)   LMP 08/04/2012   BMI 33.85 kg/m?     ?Physical Exam ?Constitutional:   ?   Appearance: Normal appearance.  ?Chest:  ?Breasts: ?   Right: Normal. No inverted nipple, mass, nipple discharge, skin change or tenderness.  ?   Left: Skin change present. No inverted nipple, mass, nipple discharge or tenderness.  ? ? ?Musculoskeletal:  ?    Cervical back: Normal range of motion and neck supple.  ?Lymphadenopathy:  ?   Cervical:  ?   Right cervical: No superficial cervical adenopathy. ?   Left cervical: No superficial cervical adenopathy.  ?   Upper Body:  ?   Right upper body: No supraclavicular or axillary adenopathy.  ?   Left upper body: Axillary adenopathy present. No supraclavicular adenopathy.  ?Neurological:  ?   Mental Status: She is alert.  ?  ? ?Assessment/Plan: ?1. Breast skin changes ?- recommended using the combination anti-fungal/steroid combination cream she has twice daily for 7-10 days and then giving update.  If not better, can order diagnostic mammogram although I really do not feel this is a breast issue.  Pt comfortable with plan.  ? ? ?

## 2022-01-03 NOTE — Telephone Encounter (Signed)
Second attempt to call patient regarding upcoming cardiac CT appointment. Left message on voicemail with name and callback number  Tullio Chausse RN Navigator Cardiac Imaging [facility] [phone] Office [phone] Cell  

## 2022-01-03 NOTE — Telephone Encounter (Signed)
Pt returning phone call regarding upcoming cardiac imaging study; pt verbalizes understanding of appt date/time, parking situation and where to check in, pre-test NPO status and medications ordered, and verified current allergies; name and call back number provided for further questions should they arise ?Rockwell Alexandria RN Navigator Cardiac Imaging ?[facility] ?[phone] office ?[phone] cell ? ?Pt states shes nervous about test, encouraged to take PRN klonopin. ?100mg  metoprolol tartrate ?Denies iv issues ?Arrival 1230 ? ?

## 2022-01-06 ENCOUNTER — Ambulatory Visit (HOSPITAL_COMMUNITY)
Admission: RE | Admit: 2022-01-06 | Discharge: 2022-01-06 | Disposition: A | Discharge status: Home / Self Care | Payer: Medicare Other | Source: Ambulatory Visit | Attending: Cardiovascular Disease | Admitting: Cardiovascular Disease

## 2022-01-06 ENCOUNTER — Other Ambulatory Visit: Payer: Self-pay | Admitting: Psychiatry

## 2022-01-06 ENCOUNTER — Encounter (HOSPITAL_COMMUNITY): Payer: Self-pay

## 2022-01-06 ENCOUNTER — Other Ambulatory Visit: Payer: Self-pay

## 2022-01-06 DIAGNOSIS — R072 Precordial pain: Secondary | ICD-10-CM | POA: Diagnosis not present

## 2022-01-06 DIAGNOSIS — F411 Generalized anxiety disorder: Secondary | ICD-10-CM

## 2022-01-06 DIAGNOSIS — F4001 Agoraphobia with panic disorder: Secondary | ICD-10-CM

## 2022-01-06 MED ORDER — IOHEXOL 350 MG/ML SOLN
95.0000 mL | Freq: Once | INTRAVENOUS | Status: AC | PRN
  Administered 2022-01-06: 95 mL via INTRAVENOUS

## 2022-01-06 MED ORDER — NITROGLYCERIN 0.4 MG SL SUBL
0.8000 mg | SUBLINGUAL_TABLET | Freq: Once | SUBLINGUAL | Status: AC
  Administered 2022-01-06: 0.8 mg via SUBLINGUAL

## 2022-01-06 MED ORDER — NITROGLYCERIN 0.4 MG SL SUBL
SUBLINGUAL_TABLET | SUBLINGUAL | Status: AC
  Filled 2022-01-06: qty 2

## 2022-01-10 ENCOUNTER — Telehealth: Payer: Self-pay | Admitting: Cardiovascular Disease

## 2022-01-10 NOTE — Telephone Encounter (Signed)
Patient returned call for her CT results.  

## 2022-01-10 NOTE — Telephone Encounter (Signed)
Spoke with patient about CT results. ? ?Per Dr. Eden Emms: ? ?Susan Stade, MD  ?01/09/2022 12:31 PM EDT   ?  ?No significant obstructive CAD  ? ?Patient verbalized understanding and thanked me for calling. ?

## 2022-02-14 ENCOUNTER — Other Ambulatory Visit: Payer: Self-pay | Admitting: Internal Medicine

## 2022-02-25 ENCOUNTER — Ambulatory Visit (INDEPENDENT_AMBULATORY_CARE_PROVIDER_SITE_OTHER): Payer: Medicare Other | Admitting: Nurse Practitioner

## 2022-02-25 ENCOUNTER — Encounter: Payer: Self-pay | Admitting: Nurse Practitioner

## 2022-02-25 VITALS — BP 120/80 | HR 79 | Temp 97.5°F | Wt 221.4 lb

## 2022-02-25 DIAGNOSIS — B369 Superficial mycosis, unspecified: Secondary | ICD-10-CM

## 2022-02-25 DIAGNOSIS — J0100 Acute maxillary sinusitis, unspecified: Secondary | ICD-10-CM | POA: Diagnosis not present

## 2022-02-25 MED ORDER — AZITHROMYCIN 250 MG PO TABS: ORAL_TABLET | ORAL | 1 refills | Status: DC

## 2022-02-25 MED ORDER — DEXAMETHASONE 1 MG PO TABS: ORAL_TABLET | ORAL | 0 refills | Status: DC

## 2022-02-25 NOTE — Progress Notes (Signed)
Assessment and Plan: ? ?Susan Bartlett was seen today for acute visit. ? ?Diagnoses and all orders for this visit: ? ?Acute maxillary sinusitis, recurrence not specified ?Use Mucinex, Vit C, Vit D and zinc.  Push fluids ?If no improvement by the end of the week notify the office ?-     azithromycin (ZITHROMAX) 250 MG tablet; Take 2 tablets (500 mg) on  Day 1,  followed by 1 tablet (250 mg) once daily on Days 2 through 5. ?-     dexamethasone (DECADRON) 1 MG tablet; Take 3 tabs for 3 days, 2 tabs for 3 days 1 tab for 5 days. Take with food. ? ?Fungal infection under breasts ?Advised to use Lotrimin powder and lotrisone cream as needed ?Keep area clean and dry ? ?  ? ? ? ?Further disposition pending results of labs. Discussed med's effects and SE's.   ?Over 30 minutes of exam, counseling, chart review, and critical decision making was performed.  ? ?Future Appointments  ?Date Time Provider Department Center  ?03/25/2022  4:00 PM [facility] PFT ROOM [facility] None  ?04/02/2022  2:00 PM Judd Gaudier, NP [facility] None  ?06/17/2022  2:30 PM Judd Gaudier, NP [facility] None  ?11/04/2022  2:00 PM Judd Gaudier, NP [facility] None  ? ? ?------------------------------------------------------------------------------------------------------------------ ? ? ?HPI ?BP 120/80   Pulse 79   Temp (!) 97.5 ?F (36.4 ?C)   Wt 221 lb 6.4 oz (100.4 kg)   LMP 08/04/2012   SpO2 98%   BMI 33.66 kg/m?  ? ?56 y.o.female presents for Sinus congestion, headaches in frontal area and at base of skull, jaw/teeth pain.  Nonproductive cough. Nasal drainage of yellow mucus.  Body aches since having covid 02/01/22. Denies fever, nausea, vomiting, diarrhea. She has been using Advil and Mucinex congestion spray. ? ?She has also been experiencing a rash under her left breast that is itchy.  Was prescribed Lotrisone previously but has not used.  Area persists. ? ?Past Medical History:  ?Diagnosis Date  ? Anal fissure 1994  ? Anxiety   ? Arthritis   ?  Dr. Penni Bombard at [facility]  ? Depression   ? Endometriosis   ? Fracture 08/2011  ? fracture left wrist and elbow  ? Hemorrhoids 2008  ? Hepatomegaly   ? Hypothyroidism   ? Mixed hyperlipidemia   ? Obesity   ? OCD (obsessive compulsive disorder)   ? Panic disorder   ? Plantar fasciitis, right 03/2014  ? Prediabetes 01/16/2015  ? RLS (restless legs syndrome) 2014  ? vis sleep study- no sleep apnea  ? Shingles 07/2017  ? Tobacco abuse   ? not tolerated Chantix in the past  ? Vitamin D deficiency   ?  ? ?No Known Allergies ? ?Current Outpatient Medications on File Prior to Visit  ?Medication Sig  ? Cholecalciferol (VITAMIN D3) 1.25 MG (50000 UT) TABS Take 50,000 Int'l Units by mouth once a week.  ? clonazePAM (KLONOPIN) 1 MG tablet TAKE 1/2 TABLET 3 TIMES DAILY,1 TABLET AT BEDTIME AND 1/2 TABLET DAILY AS NEEDED.  ? gabapentin (NEURONTIN) 100 MG capsule TAKE 1 TO 3 CAPSULES BY MOUTH AS NEEDED FOR  RESTLESS  LEGS  ? levothyroxine (SYNTHROID) 88 MCG tablet Take 1.5 tabs on Monday and Thursday, take 1 tab all other days. Take on an empty stomach first thing in the morning with water only, no other foods/drinks/meds for at least 30 min.  ? oxymetazoline (MUCINEX NASAL SPRAY FULL FORCE) 0.05 % nasal spray Place 1 spray into both nostrils  2 (two) times daily.  ? Phenylephrine-APAP-guaiFENesin (TYLENOL SINUS SEVERE PO) Take by mouth.  ? pravastatin (PRAVACHOL) 40 MG tablet TAKE 1 TABLET BY MOUTH AT BEDTIME FOR CHOLESTEROL  ? sertraline (ZOLOFT) 100 MG tablet Take 2.5 tablets (250 mg total) by mouth daily.  ? sodium chloride (OCEAN) 0.65 % SOLN nasal spray Place 1 spray into both nostrils as needed for congestion.  ? bacitracin 500 UNIT/GM ointment Apply 1 application topically 2 (two) times daily. (Patient not taking: Reported on 12/06/2021)  ? Cetirizine HCl 10 MG CAPS Take by mouth. (Patient not taking: Reported on 12/31/2021)  ? clotrimazole-betamethasone (LOTRISONE) cream Apply 1 application. topically 2 (two) times  daily. (Patient not taking: Reported on 02/25/2022)  ? metoprolol tartrate (LOPRESSOR) 100 MG tablet Take one tablet by mouth 2 hours prior to CT (Patient not taking: Reported on 02/25/2022)  ? mometasone (NASONEX) 50 MCG/ACT nasal spray Place 2 sprays into the nose daily. (Patient not taking: Reported on 12/31/2021)  ? ?No current facility-administered medications on file prior to visit.  ? ? ?ROS: all negative except above.  ? ?Physical Exam: ? ?BP 120/80   Pulse 79   Temp (!) 97.5 ?F (36.4 ?C)   Wt 221 lb 6.4 oz (100.4 kg)   LMP 08/04/2012   SpO2 98%   BMI 33.66 kg/m?  ? ?General Appearance: Well nourished, in no apparent distress. ?Eyes: PERRLA, EOMs, conjunctiva no swelling or erythema ?Sinuses: Positive maxillary tenderness ?ENT/Mouth: Ext aud canals clear, TMs without erythema, bulging. No erythema, swelling, or exudate on post pharynx.  Tonsils not swollen or erythematous. Hearing normal.  ?Neck: Supple, thyroid normal.  ?Respiratory: Respiratory effort normal, BS equal bilaterally without rales, rhonchi, wheezing or stridor.  ?Cardio: RRR with no MRGs. Brisk peripheral pulses without edema.  ?Abdomen: Soft, + BS.  Non tender, no guarding, rebound, hernias, masses. ?Lymphatics: Positive cervical adenopathy bilaterally ?Musculoskeletal: Full ROM, 5/5 strength, normal gait.  ?Skin: Warm, dry . Fungal appearing rash under left breast ?Neuro: Cranial nerves intact. Normal muscle tone, no cerebellar symptoms. Sensation intact.  ?Psych: Awake and oriented X 3, normal affect, Insight and Judgment appropriate.  ?  ? ?Revonda Humphrey, NP ?3:07 PM ?[facility] ? ?

## 2022-03-11 ENCOUNTER — Ambulatory Visit: Payer: Medicare Other | Admitting: Adult Health

## 2022-03-12 ENCOUNTER — Ambulatory Visit: Payer: Medicare Other | Admitting: Adult Health

## 2022-03-12 ENCOUNTER — Encounter: Payer: Self-pay | Admitting: Adult Health

## 2022-03-12 VITALS — BP 112/74 | HR 97 | Temp 96.8°F | Wt 222.2 lb

## 2022-03-12 DIAGNOSIS — R0989 Other specified symptoms and signs involving the circulatory and respiratory systems: Secondary | ICD-10-CM | POA: Diagnosis not present

## 2022-03-12 DIAGNOSIS — F17200 Nicotine dependence, unspecified, uncomplicated: Secondary | ICD-10-CM | POA: Diagnosis not present

## 2022-03-12 DIAGNOSIS — R0609 Other forms of dyspnea: Secondary | ICD-10-CM | POA: Diagnosis not present

## 2022-03-12 MED ORDER — BUPROPION HCL ER (XL) 150 MG PO TB24: ORAL_TABLET | ORAL | 3 refills | Status: DC

## 2022-03-12 NOTE — Progress Notes (Signed)
Assessment and Plan:  Susan Bartlett was seen today for hypertension.  Diagnoses and all orders for this visit:  Dyspnea on minimal exertion Benign cardiology evaluation -  Reminded to STOP SMOKING - see plan below Will refer for pulmonologist evaluation, consideration of PFTs or other workup as recommended -note benign lungs on coronary chest CT If pulm workup also benign, ? Deconditioning, ? Benefit from cardiopulmonary rehab -she has had limited progress trying to exercise independently  -     Ambulatory referral to Pulmonology  Tobacco use disorder Discussed risks associated with tobacco use and advised to reduce or quit Patient is ready to do so and plans to quit with wellbutrin (previously successful with this) Hx of night terrors with chantix, didn't help Will follow up at the next visit -     buPROPion (WELLBUTRIN XL) 150 MG 24 hr tablet; Take 1 tab daily in the morning for mood and smoking cessation.  Labile hypertension Reassured patient; normal readings in office today Monitor blood pressure at home; call if consistently over 130/80 Continue DASH diet.   Reminder to go to the ER if any CP, SOB, nausea, dizziness, severe HA, changes vision/speech, left arm numbness and tingling and jaw pain.   Further disposition pending results of labs. Discussed med's effects and SE's.   Over 20 minutes of exam, counseling, chart review, and critical decision making was performed.   Future Appointments  Date Time Provider Department Center  03/25/2022  4:00 PM [facility] PFT ROOM [facility] None  04/02/2022  2:00 PM Susan Gaudier, NP [facility] None  06/17/2022  2:30 PM Susan Gaudier, NP [facility] None  11/04/2022  2:00 PM Susan Gaudier, NP [facility] None    ------------------------------------------------------------------------------------------------------------------   HPI BP 112/74   Pulse 97   Temp (!) 96.8 F (36 C)   Wt 222 lb 3.2 oz (100.8 kg)   LMP 08/04/2012    SpO2 99%   BMI 33.79 kg/m  56 y.o.female smoker presents for evaluation due to concern with elevated BPs at home, also ongoing exertional dyspnea.   She presents for evaluation due to concern with atypically elevated BP measurement at work place yesterday, 140s/80s, also rechecked at home, had 130s/80s measurement, but recheck 10 min later showed 120s/70s.   BP Readings from Last 3 Encounters:  03/12/22 112/74  02/25/22 120/80  01/06/22 124/76   She is a smoker; she has failed cold [place], chantix remotely caused night terrors per patient. She recalls doing well with wellbutrin, this was actually prescribed by her psych last year, requests we send in script today.   She has ongoing dyspnea with minimal exertion, had cardiology evaluation by Susan Bartlett, had CT coronary on 01/06/2022 with minimal CAD; lung anatomy overread was benign, no nodule/mass/hyperinflation or evidence of ILD on imaging. She was referred to pulm for PFTs but states has not been able to schedule.   Past Medical History:  Diagnosis Date   Anal fissure 1994   Anxiety    Arthritis    Dr. Penni Bartlett at [facility]   Depression    Endometriosis    Fracture 08/2011   fracture left wrist and elbow   Hemorrhoids 2008   Hepatomegaly    Hypothyroidism    Mixed hyperlipidemia    Obesity    OCD (obsessive compulsive disorder)    Panic disorder    Plantar fasciitis, right 03/2014   Prediabetes 01/16/2015   RLS (restless legs syndrome) 2014   vis sleep study- no sleep apnea   Shingles 07/2017  Tobacco abuse    not tolerated Chantix in the past   Vitamin D deficiency      No Known Allergies  Current Outpatient Medications on File Prior to Visit  Medication Sig   Cholecalciferol (VITAMIN D3) 1.25 MG (50000 UT) TABS Take 50,000 Int'l Units by mouth once a week.   clonazePAM (KLONOPIN) 1 MG tablet TAKE 1/2 TABLET 3 TIMES DAILY,1 TABLET AT BEDTIME AND 1/2 TABLET DAILY AS NEEDED.   clotrimazole-betamethasone  (LOTRISONE) cream Apply 1 application. topically 2 (two) times daily.   gabapentin (NEURONTIN) 100 MG capsule TAKE 1 TO 3 CAPSULES BY MOUTH AS NEEDED FOR  RESTLESS  LEGS   levothyroxine (SYNTHROID) 88 MCG tablet Take 1.5 tabs on Monday and Thursday, take 1 tab all other days. Take on an empty stomach first thing in the morning with water only, no other foods/drinks/meds for at least 30 min.   mometasone (NASONEX) 50 MCG/ACT nasal spray Place 2 sprays into the nose daily.   Phenylephrine-APAP-guaiFENesin (TYLENOL SINUS SEVERE PO) Take by mouth.   pravastatin (PRAVACHOL) 40 MG tablet TAKE 1 TABLET BY MOUTH AT BEDTIME FOR CHOLESTEROL   sertraline (ZOLOFT) 100 MG tablet Take 2.5 tablets (250 mg total) by mouth daily.   sodium chloride (OCEAN) 0.65 % SOLN nasal spray Place 1 spray into both nostrils as needed for congestion.   azithromycin (ZITHROMAX) 250 MG tablet Take 2 tablets (500 mg) on  Day 1,  followed by 1 tablet (250 mg) once daily on Days 2 through 5.   dexamethasone (DECADRON) 1 MG tablet Take 3 tabs for 3 days, 2 tabs for 3 days 1 tab for 5 days. Take with food.   metoprolol tartrate (LOPRESSOR) 100 MG tablet Take one tablet by mouth 2 hours prior to CT (Patient not taking: Reported on 02/25/2022)   oxymetazoline (AFRIN) 0.05 % nasal spray Place 1 spray into both nostrils 2 (two) times daily. (Patient not taking: Reported on 03/12/2022)   No current facility-administered medications on file prior to visit.    ROS: all negative except above.   Physical Exam:  BP 112/74   Pulse 97   Temp (!) 96.8 F (36 C)   Wt 222 lb 3.2 oz (100.8 kg)   LMP 08/04/2012   SpO2 99%   BMI 33.79 kg/m   General Appearance: Well nourished, in no apparent distress. Eyes: PERRLA, EOMs, conjunctiva no swelling or erythema Sinuses: No Frontal/maxillary tenderness ENT/Mouth: Ext aud canals clear, TMs without erythema, bulging. No erythema, swelling, or exudate on post pharynx.  Tonsils not swollen or  erythematous. Hearing normal.  Neck: Supple Respiratory: Respiratory effort normal, BS equal bilaterally without rales, rhonchi, wheezing or stridor.  Cardio: RRR with no MRGs. Brisk peripheral pulses without edema.  Lymphatics: Non tender without lymphadenopathy.  Musculoskeletal: normal gait.  Skin: Warm, dry without rashes, lesions, ecchymosis.  Neuro: Normal muscle tone Psych: Awake and oriented X 3, normal affect, Insight and Judgment appropriate.     Dan Maker, NP 4:02 PM [facility]

## 2022-03-25 ENCOUNTER — Ambulatory Visit: Payer: Medicare Other | Admitting: Internal Medicine

## 2022-03-25 ENCOUNTER — Other Ambulatory Visit: Payer: Self-pay | Admitting: Internal Medicine

## 2022-03-25 DIAGNOSIS — R0609 Other forms of dyspnea: Secondary | ICD-10-CM

## 2022-03-25 LAB — PULMONARY FUNCTION TEST
DL/VA % pred: 93 %
DL/VA: 3.89 ml/min/mmHg/L
DLCO cor % pred: 87 %
DLCO cor: 19.76 ml/min/mmHg
DLCO unc % pred: 87 %
DLCO unc: 19.76 ml/min/mmHg
FEF 25-75 Post: 2.96 L/sec
FEF 25-75 Pre: 2.43 L/sec
FEF2575-%Change-Post: 22 %
FEF2575-%Pred-Post: 111 %
FEF2575-%Pred-Pre: 91 %
FEV1-%Change-Post: 5 %
FEV1-%Pred-Post: 91 %
FEV1-%Pred-Pre: 86 %
FEV1-Post: 2.66 L
FEV1-Pre: 2.53 L
FEV1FVC-%Change-Post: -1 %
FEV1FVC-%Pred-Pre: 101 %
FEV6-%Change-Post: 5 %
FEV6-%Pred-Post: 92 %
FEV6-%Pred-Pre: 87 %
FEV6-Post: 3.36 L
FEV6-Pre: 3.18 L
FEV6FVC-%Change-Post: 0 %
FEV6FVC-%Pred-Post: 102 %
FEV6FVC-%Pred-Pre: 103 %
FVC-%Change-Post: 6 %
FVC-%Pred-Post: 89 %
FVC-%Pred-Pre: 84 %
FVC-Post: 3.38 L
FVC-Pre: 3.18 L
Post FEV1/FVC ratio: 79 %
Post FEV6/FVC ratio: 99 %
Pre FEV1/FVC ratio: 80 %
Pre FEV6/FVC Ratio: 100 %
RV % pred: 124 %
RV: 2.59 L
TLC % pred: 107 %
TLC: 5.91 L

## 2022-03-25 NOTE — Progress Notes (Signed)
PFT done today. 

## 2022-03-26 ENCOUNTER — Other Ambulatory Visit: Payer: Self-pay | Admitting: Psychiatry

## 2022-03-26 DIAGNOSIS — F411 Generalized anxiety disorder: Secondary | ICD-10-CM

## 2022-03-26 DIAGNOSIS — F4001 Agoraphobia with panic disorder: Secondary | ICD-10-CM

## 2022-03-26 DIAGNOSIS — F422 Mixed obsessional thoughts and acts: Secondary | ICD-10-CM

## 2022-03-26 NOTE — Telephone Encounter (Signed)
Please schedule appt

## 2022-03-28 ENCOUNTER — Encounter: Payer: Self-pay | Admitting: Adult Health

## 2022-03-28 NOTE — Telephone Encounter (Signed)
LVM to scheduled follow up

## 2022-04-01 NOTE — Telephone Encounter (Signed)
LVM to schedule f/u appt

## 2022-04-02 ENCOUNTER — Ambulatory Visit (INDEPENDENT_AMBULATORY_CARE_PROVIDER_SITE_OTHER): Payer: Medicare Other | Admitting: Adult Health

## 2022-04-02 ENCOUNTER — Encounter: Payer: Self-pay | Admitting: Adult Health

## 2022-04-02 VITALS — BP 112/80 | HR 80 | Temp 97.5°F | Ht 68.0 in | Wt 221.0 lb

## 2022-04-02 DIAGNOSIS — I700 Atherosclerosis of aorta: Secondary | ICD-10-CM | POA: Diagnosis not present

## 2022-04-02 DIAGNOSIS — F325 Major depressive disorder, single episode, in full remission: Secondary | ICD-10-CM

## 2022-04-02 DIAGNOSIS — F410 Panic disorder [episodic paroxysmal anxiety] without agoraphobia: Secondary | ICD-10-CM

## 2022-04-02 DIAGNOSIS — F17200 Nicotine dependence, unspecified, uncomplicated: Secondary | ICD-10-CM

## 2022-04-02 DIAGNOSIS — E782 Mixed hyperlipidemia: Secondary | ICD-10-CM | POA: Diagnosis not present

## 2022-04-02 DIAGNOSIS — E039 Hypothyroidism, unspecified: Secondary | ICD-10-CM

## 2022-04-02 DIAGNOSIS — R0989 Other specified symptoms and signs involving the circulatory and respiratory systems: Secondary | ICD-10-CM

## 2022-04-02 DIAGNOSIS — R0609 Other forms of dyspnea: Secondary | ICD-10-CM | POA: Diagnosis not present

## 2022-04-02 DIAGNOSIS — E559 Vitamin D deficiency, unspecified: Secondary | ICD-10-CM | POA: Diagnosis not present

## 2022-04-02 DIAGNOSIS — R7309 Other abnormal glucose: Secondary | ICD-10-CM | POA: Diagnosis not present

## 2022-04-02 DIAGNOSIS — Z79899 Other long term (current) drug therapy: Secondary | ICD-10-CM | POA: Diagnosis not present

## 2022-04-02 NOTE — Patient Instructions (Signed)
  SMOKING CESSATION  [employer]  [phone] for more information or for a free program for smoking cessation help.   You can call QUIT SMART 1-800-QUIT-NOW for free nicotine patches or replacement therapy- if they are out- keep calling   cancer center Can call for smoking cessation classes, [phone]  If you have a smart phone, please look up Smoke Free app, this will help you stay on track and give you information about money you have saved, life that you have gained back and a ton of more information.     ADVANTAGES OF QUITTING SMOKING  Within 20 minutes, blood pressure decreases. Your pulse is at normal level.  After 8 hours, carbon monoxide levels in the blood return to normal. Your oxygen level increases.  After 24 hours, the chance of having a heart attack starts to decrease. Your breath, hair, and body stop smelling like smoke.  After 48 hours, damaged nerve endings begin to recover. Your sense of taste and smell improve.  After 72 hours, the body is virtually free of nicotine. Your bronchial tubes relax and breathing becomes easier.  After 2 to 12 weeks, lungs can hold more air. Exercise becomes easier and circulation improves.  After 1 year, the risk of coronary heart disease is cut in half.  After 5 years, the risk of stroke falls to the same as a nonsmoker.  After 10 years, the risk of lung cancer is cut in half and the risk of other cancers decreases significantly.  After 15 years, the risk of coronary heart disease drops, usually to the level of a nonsmoker.  You will have extra money to spend on things other than cigarettes.   

## 2022-04-02 NOTE — Progress Notes (Signed)
3 MONTH FOLLOW UP  Assessment:      Hypothyroidism, unspecified hypothyroidism type -continue medications the same pending labs, reminded to take on an empty stomach 30-43mins before food.  - TSH  Mixed hyperlipidemia -med anxiety, currently pravastatin 40 mg daily - consider rosuvastatin 5 mg if remains above goal with lifestyle changes - check lipids, decrease fatty foods, increase activity. - CMP/GFR - Lipid panel  Vitamin D deficiency Continue supplement for goal around 66 - Vit D  25 hydroxy (rtn osteoporosis monitoring)  Other abnormal glucose A1C annually Discussed disease and risks Discussed diet/exercise, weight management  - CPM/GFR  Morbid obesity (HCC) - BMI 35 with hld, htn Long discussion about weight loss, diet, and exercise Recommended diet heavy in fruits and veggies and low in animal meats, cheeses, and dairy products, appropriate calorie intake Discussed appropriate weight for height Dyspnea/anxiety limits, so far benign cardio/pulmonary workup, consider cardiopulmonary PT if she is having difficulty increasing due to anxiety  Follow up at next visit  Hepatomegaly Weight loss encouraged, monitor LFTs  Dyspnea with minimal exertion Benign cardiology evaluation -  Reminded to STOP SMOKING - see plan below Will refer for pulmonologist evaluation, consideration of PFTs or other workup as recommended -note benign lungs on coronary chest CT - PFTs benign, plans OV with pulmonologist If pulm workup also benign, discussed deconditioning, encourage she continue daily gentle exercise and increase gradually - if struggling due to anxiety, ? Benefit from cardiopulmonary rehab  Tobacco use disorder - 32 pack year hx  Smoking cessation-  instruction/counseling given, counseled patient on the dangers of tobacco use, advised patient to stop smoking, and reviewed strategies to maximize success, patient not ready to quit at this time but discussed reducing use.   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 Jennelle Human managing Reviewed meds in detail due to anxiety  B12 def - hasn't started supplement, receptive, suggested 500 mcg daily or 1000 mcg SL 3 days/week - defer recheck to follow up  Orders Placed This Encounter  Procedures   CBC with Differential/Platelet   COMPLETE METABOLIC PANEL WITH GFR   Magnesium   Lipid panel   TSH   VITAMIN D 25 Hydroxy (Vit-D Deficiency, Fractures)    Over 30 minutes of exam, counseling, chart review and critical decision making was performed  Future Appointments  Date Time Provider Department Center  07/03/2022  3:30 PM Adela Glimpse, NP [facility] None  11/04/2022  2:00 PM Raynelle Dick, NP [facility] None     Subjective:  Susan Bartlett is a 56 y.o. presents for 3 month follow up on htn, hld, atherosclerosis, smoking, anxiety, dyspnea, vitamin D and B def, morbid obesity.   She reported was very sedentary (barely getting out of bed) from 12/2019 after her niece passed until 12/20/2020, barely moved for 1 year due to severe depression and panic disorder (follows with Dr. Jennelle Human who prescribes meds, currently taking sertraline 250 mg daily, added wellbutrin 150 mg recently but reports hasn't started taking), has been working to increase activity by getting out and walk more however immediately has shortness of breath with minimal exertion and becomes anxious about her heart or lungs and had to stop.   Due to ongoing dyspnea with minimal exertion, had cardiology evaluation by Dr. Eden Emms, had CT coronary on 01/06/2022 with minimal CAD; lung anatomy overread was benign, no nodule/mass/hyperinflation or evidence of ILD on imaging. She  was referred to pulm for PFTs, had on 03/25/2022, FEV1/FVC pre/post was 80/79, minimal obstructive airway disease. Plans to schedule OV to discuss further.   She is a smoker, was smoking 1.5  packs/day, down to 3 cigs/day with slow taper, since she was 56 years old, no mass or nodule was noted on CT chest 01/06/2022.  Chantix remotely caused night terrors per patient.   BMI is Body mass index is 33.6 kg/m., she has been working on diet and exercise, walking the cul-de-sac in front of her house with her son, has been able to increase from 2 to 7 laps with his encouragement.  Wt Readings from Last 3 Encounters:  04/02/22 221 lb (100.2 kg)  03/12/22 222 lb 3.2 oz (100.8 kg)  02/25/22 221 lb 6.4 oz (100.4 kg)   Today their BP is BP: 112/80. Denies CP, dizziness. She has exertional dyspnea but benign work ups, improving with regular walking.   She is on cholesterol medication, pravastatin 40 mg daily was above goal, suggested rosuvastatin but didn't want to switch, working on lifestyle. Her cholesterol is not at goal. The cholesterol last visit was:   Lab Results  Component Value Date   CHOL 196 10/31/2021   HDL 45 (L) 10/31/2021   LDLCALC 125 (H) 10/31/2021   TRIG 148 10/31/2021   CHOLHDL 4.4 10/31/2021   Last W2X in the office was:  Lab Results  Component Value Date   HGBA1C 5.3 10/31/2021   She has hx of vit D def. Patient was off of vitamin D last check, back to taking 93716 IU 1 once weekly.  Lab Results  Component Value Date   VD25OH 23 (L) 10/31/2021   She is on thyroid medication, she is on 88 mcg 1 tab daily, never increase to 1.5 twice a week that was recommended, takes on empty stomach with water, taking regularly.  Lab Results  Component Value Date   TSH 9.24 (H) 10/31/2021   Hasn't started supplement, receptive -  Lab Results  Component Value Date   VITAMINB12 248 10/31/2021     Current Outpatient Medications  Medication Instructions   buPROPion (WELLBUTRIN XL) 150 MG 24 hr tablet Take 1 tab daily in the morning for mood and smoking cessation.   Cholecalciferol (VITAMIN D3) 1.25 MG (50000 UT) TABS 50,000 Int'l Units, Oral, Weekly   clonazePAM  (KLONOPIN) 1 MG tablet TAKE 1/2 TABLET 3 TIMES DAILY,1 TABLET AT BEDTIME AND 1/2 TABLET DAILY AS NEEDED.   clotrimazole-betamethasone (LOTRISONE) cream 1 application , Topical, 2 times daily   gabapentin (NEURONTIN) 100 MG capsule TAKE 1 TO 3 CAPSULES BY MOUTH AS NEEDED FOR  RESTLESS  LEGS   levothyroxine (SYNTHROID) 88 MCG tablet Take 1.5 tabs on Monday and Thursday, take 1 tab all other days. Take on an empty stomach first thing in the morning with water only, no other foods/drinks/meds for at least 30 min.   metoprolol tartrate (LOPRESSOR) 100 MG tablet Take one tablet by mouth 2 hours prior to CT   mometasone (NASONEX) 50 MCG/ACT nasal spray 2 sprays, Nasal, Daily   Phenylephrine-APAP-guaiFENesin (TYLENOL SINUS SEVERE PO) Oral   pravastatin (PRAVACHOL) 40 MG tablet TAKE 1 TABLET BY MOUTH AT BEDTIME FOR CHOLESTEROL   sertraline (ZOLOFT) 100 MG tablet TAKE 2 & 1/2 (TWO & ONE-HALF) TABLETS BY MOUTH ONCE DAILY   sodium chloride (OCEAN) 0.65 % SOLN nasal spray 1 spray, Each Nare, As needed     Current Problems (verified) Patient Active Problem List   Diagnosis  Date Noted   Dyspnea on minimal exertion 10/31/2021   B12 deficiency 09/12/2021   Aortic arch atherosclerosis (HCC) - per CTA 04/2021 09/12/2021   Abnormal glucose 11/24/2018   Labile hypertension 03/05/2018   Tobacco use disorder 01/16/2015   TMJ (dislocation of temporomandibular joint) 01/16/2015   Vitamin D deficiency    Hepatomegaly    Mixed hyperlipidemia    Panic disorder    Anal fissure    Depression, major, in remission (HCC)    Hypothyroidism    Morbid obesity (HCC) - BMI 35+ with htn, hld     Allergies No Known Allergies  SURGICAL HISTORY She  has a past surgical history that includes Hemorroidectomy; Cholecystectomy; Left oophorectomy; and Tonsillectomy. FAMILY HISTORY Her family history includes Alcohol abuse in her sister; Breast cancer in her cousin and mother; Cirrhosis in her father; Colon cancer (age of  onset: 72) in her maternal grandmother; Diabetes in her mother; Drug abuse in her brother; Heart disease in her father. SOCIAL HISTORY She  reports that she has been smoking cigarettes. She started smoking about 33 years ago. She has a 32.00 pack-year smoking history. She has never used smokeless tobacco. She reports current alcohol use. She reports that she does not use drugs.    Review of Systems  Constitutional:  Negative for chills, fever, malaise/fatigue and weight loss.  HENT:  Negative for congestion, ear pain, hearing loss, sore throat and tinnitus.   Eyes:  Negative for blurred vision and double vision.  Respiratory:  Positive for shortness of breath (exertional, improving). Negative for cough, sputum production and wheezing.   Cardiovascular:  Negative for chest pain, palpitations, orthopnea, claudication and leg swelling.  Gastrointestinal:  Negative for abdominal pain, blood in stool, constipation, diarrhea, heartburn, melena, nausea and vomiting.  Genitourinary: Negative.   Musculoskeletal:  Negative for falls, joint pain and myalgias.  Skin:  Negative for rash.  Neurological:  Negative for dizziness, tingling, sensory change, weakness and headaches.  Endo/Heme/Allergies:  Negative for polydipsia.  Psychiatric/Behavioral:  Positive for depression. Negative for hallucinations, substance abuse and suicidal ideas. The patient is nervous/anxious. The patient does not have insomnia.   All other systems reviewed and are negative.   Objective:     Blood pressure 112/80, pulse 80, temperature (!) 97.5 F (36.4 C), height 5\' 8"  (1.727 m), weight 221 lb (100.2 kg), last menstrual period 08/04/2012, SpO2 99 %. Body mass index is 33.6 kg/m.  General appearance: alert, no distress, WD/WN, female HEENT: normocephalic, sclerae anicteric, TMs pearly, nares patent, no discharge or erythema, pharynx normal. Soft palate without significant lesion or ulceration, tongue with some white/brown  coating.  Oral cavity: MMM, no lesions Neck: supple, no lymphadenopathy, no thyromegaly, no masses Heart: RRR, normal S1, S2, no murmurs Lungs: CTA bilaterally, no wheezes, rhonchi, or rales Abdomen: +bs, soft, non tender, non distended, no masses, no hepatomegaly, no splenomegaly Musculoskeletal: nontender, no swelling, no obvious deformity Extremities: no edema, no cyanosis, no clubbing Pulses: 2+ symmetric, upper and lower extremities, normal cap refill Neurological: alert, oriented x 3, CN2-12 intact, strength normal upper extremities and lower extremities, sensation normal throughout, normal gait Psychiatric: normal affect, non-pressured speech, behavior normal, pleasant    08/06/2012, NP   04/02/2022

## 2022-04-03 ENCOUNTER — Other Ambulatory Visit: Payer: Self-pay | Admitting: Adult Health

## 2022-04-03 DIAGNOSIS — E782 Mixed hyperlipidemia: Secondary | ICD-10-CM

## 2022-04-03 DIAGNOSIS — R7989 Other specified abnormal findings of blood chemistry: Secondary | ICD-10-CM

## 2022-04-03 LAB — COMPLETE METABOLIC PANEL WITH GFR
AG Ratio: 2 (calc) (ref 1.0–2.5)
ALT: 14 U/L (ref 6–29)
AST: 16 U/L (ref 10–35)
Albumin: 4.7 g/dL (ref 3.6–5.1)
Alkaline phosphatase (APISO): 76 U/L (ref 37–153)
BUN/Creatinine Ratio: 8 (calc) (ref 6–22)
BUN: 6 mg/dL — ABNORMAL LOW (ref 7–25)
CO2: 27 mmol/L (ref 20–32)
Calcium: 10.5 mg/dL — ABNORMAL HIGH (ref 8.6–10.4)
Chloride: 102 mmol/L (ref 98–110)
Creat: 0.75 mg/dL (ref 0.50–1.03)
Globulin: 2.3 g/dL (calc) (ref 1.9–3.7)
Glucose, Bld: 76 mg/dL (ref 65–99)
Potassium: 4.8 mmol/L (ref 3.5–5.3)
Sodium: 138 mmol/L (ref 135–146)
Total Bilirubin: 0.5 mg/dL (ref 0.2–1.2)
Total Protein: 7 g/dL (ref 6.1–8.1)
eGFR: 93 mL/min/{1.73_m2} (ref >=60)

## 2022-04-03 LAB — CBC WITH DIFFERENTIAL/PLATELET
Absolute Monocytes: 461 cells/uL (ref 200–950)
Basophils Absolute: 50 cells/uL (ref 0–200)
Basophils Relative: 0.7 %
Eosinophils Absolute: 367 cells/uL (ref 15–500)
Eosinophils Relative: 5.1 %
HCT: 40.9 % (ref 35.0–45.0)
Hemoglobin: 13.7 g/dL (ref 11.7–15.5)
Lymphs Abs: 1800 cells/uL (ref 850–3900)
MCH: 30.4 pg (ref 27.0–33.0)
MCHC: 33.5 g/dL (ref 32.0–36.0)
MCV: 90.9 fL (ref 80.0–100.0)
MPV: 11.4 fL (ref 7.5–12.5)
Monocytes Relative: 6.4 %
Neutro Abs: 4522 cells/uL (ref 1500–7800)
Neutrophils Relative %: 62.8 %
Platelets: 250 10*3/uL (ref 140–400)
RBC: 4.5 10*6/uL (ref 3.80–5.10)
RDW: 13.5 % (ref 11.0–15.0)
Total Lymphocyte: 25 %
WBC: 7.2 10*3/uL (ref 3.8–10.8)

## 2022-04-03 LAB — LIPID PANEL
Cholesterol: 220 mg/dL — ABNORMAL HIGH (ref <200)
HDL: 48 mg/dL — ABNORMAL LOW (ref >=50)
LDL Cholesterol (Calc): 136 mg/dL (calc) — ABNORMAL HIGH
Non-HDL Cholesterol (Calc): 172 mg/dL (calc) — ABNORMAL HIGH (ref <130)
Total CHOL/HDL Ratio: 4.6 (calc) (ref <5.0)
Triglycerides: 226 mg/dL — ABNORMAL HIGH (ref <150)

## 2022-04-03 LAB — VITAMIN D 25 HYDROXY (VIT D DEFICIENCY, FRACTURES): Vit D, 25-Hydroxy: 34 ng/mL (ref 30–100)

## 2022-04-03 LAB — MAGNESIUM: Magnesium: 2.1 mg/dL (ref 1.5–2.5)

## 2022-04-03 LAB — TSH: TSH: 12.46 mIU/L — ABNORMAL HIGH (ref 0.40–4.50)

## 2022-04-03 MED ORDER — ROSUVASTATIN CALCIUM 20 MG PO TABS: ORAL_TABLET | ORAL | 3 refills | Status: DC

## 2022-04-03 MED ORDER — LEVOTHYROXINE SODIUM 88 MCG PO TABS: ORAL_TABLET | ORAL | 1 refills | Status: DC

## 2022-04-24 ENCOUNTER — Encounter: Payer: Self-pay | Admitting: Pulmonary Disease

## 2022-04-24 ENCOUNTER — Other Ambulatory Visit: Payer: Self-pay | Admitting: Pulmonary Disease

## 2022-04-24 ENCOUNTER — Ambulatory Visit: Payer: Medicare Other | Admitting: Pulmonary Disease

## 2022-04-24 VITALS — BP 114/66 | HR 77 | Temp 98.1°F | Ht 68.0 in | Wt 219.2 lb

## 2022-04-24 DIAGNOSIS — R29898 Other symptoms and signs involving the musculoskeletal system: Secondary | ICD-10-CM | POA: Insufficient documentation

## 2022-04-24 DIAGNOSIS — Z720 Tobacco use: Secondary | ICD-10-CM | POA: Diagnosis not present

## 2022-04-24 MED ORDER — ALBUTEROL SULFATE HFA 108 (90 BASE) MCG/ACT IN AERS: 2.0000 | INHALATION_SPRAY | Freq: Four times a day (QID) | RESPIRATORY_TRACT | 2 refills | Status: DC | PRN

## 2022-04-24 NOTE — Patient Instructions (Signed)
Shortness of breath secondary to deconditioning --START Albuterol TWO puffs AS NEEDED including 20 minutes before exercise --CONTINUE to exercise daily including aerobic and weight training --If symptoms persistent shortness of breath, consider adding LAMA  Tobacco abuse Patient is an active smoker. We discussed smoking cessation for 5 minutes. We discussed triggers and stressors and ways to deal with them. We discussed barriers to continued smoking and benefits of smoking cessation. Provided patient with information cessation techniques and interventions including [place] quitline.  Follow-up with me in 3 months

## 2022-04-24 NOTE — Progress Notes (Signed)
Subjective:   PATIENT ID: [name] GENDER: female DOB: [birth year], MRN: [id number]   HPI  Chief Complaint  Patient presents with   Consult    [facility] PA referred her to pulm. After very sedentary lifestyle, increased activity but post-Covid caused SOB with activity. Also hears wheezing at times.    Reason for Visit: New consult for post-covid shortness of breath and wheezing  Ms. Destanie Tibbetts is a 56 year old female activer smoker with depression, panic disorder who presents for evaluation for post-covid shortness of breath and wheezing.   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 She suffered from very severe depression (niece passed away) and was inactive for two years. She recently became more active four months ago and has noticed she has been short of breath with activity associated with wheezing. Denies cough She has anxiety about having a heart attack due to her family hx of MI's including her brother so will panic when she has shortness of breath. She also had COVID two months ago. She reports walking 1/4 mile at a good pace.   Social History: Active smoker  I have personally reviewed patient's past medical/family/social history, allergies, current medications.  Past Medical History:  Diagnosis Date   Anal fissure 1994   Anxiety    Arthritis    Dr. Penni Bombard at [facility]   Depression    Endometriosis    Fracture 08/2011   fracture left wrist and elbow   Hemorrhoids 02-01-2007   Hepatomegaly    Hypothyroidism    Mixed hyperlipidemia    Obesity    OCD (obsessive compulsive disorder)    Panic disorder    Plantar fasciitis, right 03/2014   Prediabetes 01/16/2015   RLS (restless legs syndrome) 2014   vis sleep study- no sleep apnea   Shingles 07/2017   Tobacco abuse    not tolerated Chantix in the past   Vitamin D deficiency      Family History  Problem Relation Age of Onset   Breast cancer Mother    Diabetes Mother     Drug abuse Brother        and alcohol   Cirrhosis Father        alcohol   Heart disease Father        MI   Colon cancer Maternal Grandmother 50   Alcohol abuse Sister    Breast cancer Cousin    Thyroid disease Neg Hx      Social History   Occupational History   Occupation: Agricultural consultant: [employer]  Tobacco Use   Smoking status: Every Day    Packs/day: 1.00    Years: 32.00    Total pack years: 32.00    Types: Cigarettes    Start date: 1989/01/31   Smokeless tobacco: Never   Tobacco comments:    Previously 1.5 pack per day, has been tapering down, as of 04/02/2022 reports down to 3 cigs/day  Vaping Use   Vaping Use: Never used  Substance and Sexual Activity   Alcohol use: Yes    Alcohol/week: 0.0 standard drinks of alcohol    Comment: occ glass of wine   Drug use: No   Sexual activity: Not Currently    Birth control/protection: Post-menopausal    No Known Allergies   Outpatient Medications Prior to Visit  Medication Sig Dispense Refill   buPROPion (WELLBUTRIN XL) 150 MG 24 hr tablet Take 1 tab daily  in the morning for mood and smoking cessation. 90 tablet 3   Cholecalciferol (VITAMIN D3) 1.25 MG (50000 UT) TABS Take 50,000 Int'l Units by mouth once a week. 8 tablet 1   clonazePAM (KLONOPIN) 1 MG tablet TAKE 1/2 TABLET 3 TIMES DAILY,1 TABLET AT BEDTIME AND 1/2 TABLET DAILY AS NEEDED. 90 tablet 1   clotrimazole-betamethasone (LOTRISONE) cream Apply 1 application. topically 2 (two) times daily. 15 g 2   gabapentin (NEURONTIN) 100 MG capsule TAKE 1 TO 3 CAPSULES BY MOUTH AS NEEDED FOR  RESTLESS  LEGS 90 capsule 0   levothyroxine (SYNTHROID) 88 MCG tablet Take 1.5 tabs on Mon/Wed/Fri, take 1 tab all other days. Take on an empty stomach first thing in the morning with water only, no other foods/drinks/meds for at least 30 min. 102 tablet 1   mometasone (NASONEX) 50 MCG/ACT nasal spray Place 2 sprays into the nose daily.     Phenylephrine-APAP-guaiFENesin (TYLENOL SINUS SEVERE  PO) Take by mouth.     rosuvastatin (CRESTOR) 20 MG tablet Take 1 tab daily in the evening for cholesterol to help reduce heart attack/stroke risk. 90 tablet 3   sertraline (ZOLOFT) 100 MG tablet TAKE 2 & 1/2 (TWO & ONE-HALF) TABLETS BY MOUTH ONCE DAILY 75 tablet 0   sodium chloride (OCEAN) 0.65 % SOLN nasal spray Place 1 spray into both nostrils as needed for congestion.     metoprolol tartrate (LOPRESSOR) 100 MG tablet Take one tablet by mouth 2 hours prior to CT (Patient not taking: Reported on 02/25/2022) 1 tablet 0   No facility-administered medications prior to visit.    Review of Systems  Constitutional:  Negative for chills, diaphoresis, fever, malaise/fatigue and weight loss.  HENT:  Negative for congestion.   Respiratory:  Positive for shortness of breath and wheezing. Negative for cough, hemoptysis and sputum production.   Cardiovascular:  Negative for chest pain, palpitations and leg swelling.     Objective:   Vitals:   04/24/22 1605  BP: 114/66  Pulse: 77  Temp: 98.1 F (36.7 C)  TempSrc: Oral  SpO2: 97%  Weight: 219 lb 3.2 oz (99.4 kg)  Height: 5\' 8"  (1.727 m)   SpO2: 97 % (RA)  Physical Exam: General: Well-appearing, no acute distress HENT: [place], AT Eyes: EOMI, no scleral icterus Respiratory: Clear to auscultation bilaterally.  No crackles, wheezing or rales Cardiovascular: RRR, -M/R/G, no JVD Extremities:-Edema,-tenderness Neuro: AAO x4, CNII-XII grossly intact Psych: Normal mood, normal affect  Data Reviewed:  Imaging: CT Heart 01/06/22 - Visualized lung parenchyma with no pulmonary nodules, masses, infiltrate, effusion or pneumothorax.  PFT: 03/25/22 FVC 3.38 (89%) FEV1 2.66 (80%) Ratio 107  TLC 107% DLCO 87% Interpretation: Normal PFTs. No obstructive or restrictive defect present  Labs: CBC    Component Value Date/Time   WBC 7.2 04/02/2022 1438   RBC 4.50 04/02/2022 1438   HGB 13.7 04/02/2022 1438   HGB 13.9 10/17/2019 1100   HGB 14.1  02/03/2013 1523   HCT 40.9 04/02/2022 1438   HCT 41.7 10/17/2019 1100   PLT 250 04/02/2022 1438   PLT 260 10/17/2019 1100   MCV 90.9 04/02/2022 1438   MCV 93 10/17/2019 1100   MCH 30.4 04/02/2022 1438   MCHC 33.5 04/02/2022 1438   RDW 13.5 04/02/2022 1438   RDW 14.1 10/17/2019 1100   LYMPHSABS 1,800 04/02/2022 1438   MONOABS 426 09/16/2016 1249   EOSABS 367 04/02/2022 1438   BASOSABS 50 04/02/2022 1438   Absolute eos 04/02/22 - 367  Assessment & Plan:   Discussion: 56 year old female activer smoker with depression, panic disorder who presents for evaluation for post-covid shortness of breath and wheezing.  She reports history of COPD however PFTs were normal. Available lung imaging with no evidence of abnormal parenchyma. Deconditioning likely main contributing factor. We reviewed clinical course of COVID-19 including long-term complications including post-inflammatory lung disease and long hauler symptoms.   Shortness of breath secondary to deconditioning --START Albuterol TWO puffs AS NEEDED including 20 minutes before exercise --CONTINUE to exercise daily including aerobic and weight training --If symptoms persistent shortness of breath, consider adding LAMA  Tobacco abuse Patient is an active smoker. We discussed smoking cessation for 5 minutes. We discussed triggers and stressors and ways to deal with them. We discussed barriers to continued smoking and benefits of smoking cessation. Provided patient with information cessation techniques and interventions including [place] quitline.  Health Maintenance Immunization History  Administered Date(s) Administered   Influenza Split 08/01/2013   Td 11/18/2012   CT Lung Screen - not qualified  No orders of the defined types were placed in this encounter.  Meds ordered this encounter  Medications   DISCONTD: albuterol (VENTOLIN HFA) 108 (90 Base) MCG/ACT inhaler    Sig: Inhale 2 puffs into the lungs every 6 (six) hours as  needed for wheezing or shortness of breath.    Dispense:  8 g    Refill:  2    Return in about 3 months (around 07/25/2022).  I have spent a total time of 45-minutes on the day of the appointment reviewing prior documentation, coordinating care and discussing medical diagnosis and plan with the patient/family. Imaging, labs and tests included in this note have been reviewed and interpreted independently by me.  Jessilyn Catino Mechele Collin, MD [facility] 04/24/2022 4:09 PM  Office Number [phone]

## 2022-04-26 ENCOUNTER — Other Ambulatory Visit: Payer: Self-pay | Admitting: Psychiatry

## 2022-04-26 DIAGNOSIS — F411 Generalized anxiety disorder: Secondary | ICD-10-CM

## 2022-04-26 DIAGNOSIS — F4001 Agoraphobia with panic disorder: Secondary | ICD-10-CM

## 2022-04-26 DIAGNOSIS — F422 Mixed obsessional thoughts and acts: Secondary | ICD-10-CM

## 2022-05-12 ENCOUNTER — Ambulatory Visit (INDEPENDENT_AMBULATORY_CARE_PROVIDER_SITE_OTHER): Payer: Medicare Other | Admitting: Psychiatry

## 2022-05-12 ENCOUNTER — Encounter: Payer: Self-pay | Admitting: Psychiatry

## 2022-05-12 DIAGNOSIS — F4001 Agoraphobia with panic disorder: Secondary | ICD-10-CM

## 2022-05-12 DIAGNOSIS — F422 Mixed obsessional thoughts and acts: Secondary | ICD-10-CM

## 2022-05-12 DIAGNOSIS — F5105 Insomnia due to other mental disorder: Secondary | ICD-10-CM

## 2022-05-12 DIAGNOSIS — F411 Generalized anxiety disorder: Secondary | ICD-10-CM

## 2022-05-12 DIAGNOSIS — F331 Major depressive disorder, recurrent, moderate: Secondary | ICD-10-CM | POA: Diagnosis not present

## 2022-05-12 DIAGNOSIS — G2581 Restless legs syndrome: Secondary | ICD-10-CM

## 2022-05-12 MED ORDER — SERTRALINE HCL 100 MG PO TABS: 260.0000 mg | ORAL_TABLET | Freq: Every day | ORAL | 5 refills | Status: DC

## 2022-05-12 MED ORDER — GABAPENTIN 100 MG PO CAPS: ORAL_CAPSULE | ORAL | 0 refills | Status: DC

## 2022-05-12 MED ORDER — CLONAZEPAM 1 MG PO TABS: 1.0000 mg | ORAL_TABLET | Freq: Three times a day (TID) | ORAL | 3 refills | Status: DC | PRN
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
## 2022-05-15 ENCOUNTER — Ambulatory Visit: Payer: Medicare Other

## 2022-06-13 ENCOUNTER — Ambulatory Visit: Payer: Medicare Other

## 2022-06-17 ENCOUNTER — Ambulatory Visit: Payer: Medicare Other | Admitting: Adult Health

## 2022-07-03 ENCOUNTER — Ambulatory Visit: Payer: Medicare Other | Admitting: Nurse Practitioner

## 2022-07-03 NOTE — Progress Notes (Deleted)
ANNUAL MEDICARE VISIT AND FOLLOW UP  Assessment:   Annual Medicare Wellness Visit Due annually  Health maintenance reviewed   Hypothyroidism, unspecified hypothyroidism type -check TSH level, continue medications the same, reminded to take on an empty stomach 30-96mins before food.  - TSH  Mixed hyperlipidemia -continue medications, check lipids, decrease fatty foods, increase activity. 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    Other abnormal glucose A1C annually Discussed disease and risks Discussed diet/exercise, weight management  - CPM/GFR  TMJ Improved; monitor   Morbid obesity (HCC) - BMI 35 with hld, htn Long discussion about weight loss, diet, and exercise Recommended diet heavy in fruits and veggies and low in animal meats, cheeses, and dairy products, appropriate calorie intake Discussed appropriate weight for height Follow up at next visit  Hepatomegaly Weight loss encouraged, monitor LFTs  Atypical chest pain/ family hx of cardiovascular disease Has had normal EKGs, non-exertional though does have increased dyspnea  Low risk myoview 02/2010 Recent negative serial troponins during episode, felt atypical  However high risk, smoker, poor lifestyle, hld, family hx Patient very anxious; discussed CT coronary calcium score with low threshold for referral back to cardiology Also emphasized risk reduction by lifestyle change and med Will control LDL <70 Reduce tobacco, caffeine;  Go to the ER if any chest pain, shortness of  breath, nausea, dizziness, severe HA, changes vision/speech  Sedentary; deconditioning; increased risk of injury related to fall Following prolonged period of immobility; dicussed and PT referral placed per her preference  Over 40 minutes of exam, counseling, chart review and critical decision making was performed  Future Appointments  Date Time Provider Department Center  07/03/2022  3:30 PM Adela Glimpse, NP [facility] None  07/28/2022  1:45 PM Luciano Cutter, MD [facility] None  08/14/2022  1:00 PM Cottle, Steva Ready., MD [facility] None  11/04/2022  2:00 PM Raynelle Dick, NP [facility] None  05/06/2023  3:00 PM Adela Glimpse, NP [facility] None     Plan:   During the course of the visit the patient was educated and counseled about appropriate screening and preventive services including:   Pneumococcal vaccine  Prevnar 13 Influenza vaccine Td vaccine Screening electrocardiogram Bone densitometry screening Colorectal cancer screening Diabetes screening Glaucoma screening Nutrition counseling  Advanced directives: requested    Subjective:  Susan Bartlett is a 56 y.o. presents for follow up and AWV. 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  She reports has been very sedentary (barely getting out of bed) from 12/2019 after her niece passed until 12/20/2020, 1 year due to severe depression (follows with Dr. Jennelle Human who prescribes meds), has been working to increase activity by getting out and walk more. However notes remains weak, has noted some unsteadiness, has had fall with injury.    She was  reporting episodes of palpitations, had normal labs, EKG on 02/20/2021. She presented to ED 05/09/2021 with chest pain, felt atypical, ? NSAID related (has since stopped), had negative serial troponins and EKG, had CTA unremarkable other than aortic atherosclerosis and central bronchia thickening. She was Dr. Johnsie Cancel with low risk myoview in 2011. She remains very anxious about her cardiac risk. Significant family hx of CVD.   She is a smoker, smoking 1.5 packs/day, since she was 56 years old, no mass or nodule was noted on CTA in 05/09/2021, we will plan to start annual low dose screening CT in 2023.   She has lumbar back pain, has seen Dr. Delilah Shan at [facility], no surgery was advised. Will follow up for flares, resolved with NSAID taper.   BMI is There is no height or weight on file to calculate BMI., she has not been working on diet and exercise except walking some laps around house in recent weeks.  She also drinks diet mountain dew, 12 pack diet, does 1/2 caffeine free  Wt Readings from Last 3 Encounters:  04/24/22 219 lb 3.2 oz (99.4 kg)  04/02/22 221 lb (100.2 kg)  03/12/22 222 lb 3.2 oz (100.8 kg)   Today their BP is  . She denies dyspnea, dizziness. Has had chest pain episodes.   She is on cholesterol medication, pravastatin 40 mg daily and denies myalgias. Her cholesterol is not at goal. The cholesterol last visit was:   Lab Results  Component Value Date   CHOL 220 (H) 04/02/2022   HDL 48 (L) 04/02/2022   LDLCALC 136 (H) 04/02/2022   TRIG 226 (H) 04/02/2022   CHOLHDL 4.6 04/02/2022   Last A1C in the office was:  Lab Results  Component Value Date   HGBA1C 5.3 10/31/2021   She has hx of vit D def. Patient was off of vitamin D last check, back to taking 50000 IU 1 once weekly.  Lab Results  Component Value Date   VD25OH 34 04/02/2022   She is on thyroid medication, she is on 88 mcg 1 tab daily, on empty stomach, was taking irregularly, back to taking regularly.   Lab Results  Component Value Date   TSH 12.46 (H) 04/02/2022     Current Outpatient Medications  Medication Instructions   albuterol (VENTOLIN HFA) 108 (90 Base) MCG/ACT inhaler INHALE 2 PUFFS BY MOUTH EVERY 6 HOURS AS NEEDED FOR WHEEZING OR SHORTNESS OF BREATH   buPROPion (WELLBUTRIN XL) 150 MG 24 hr tablet Take 1 tab daily in the morning for mood and smoking cessation.   Cholecalciferol (VITAMIN D3) 1.25 MG (50000 UT) TABS 50,000 Int'l Units, Oral, Weekly   clonazePAM (KLONOPIN) 1 mg, Oral, 3 times daily PRN   clotrimazole-betamethasone (LOTRISONE) cream 1 application , Topical, 2 times daily   gabapentin (NEURONTIN) 100 MG capsule TAKE 1 TO 3 CAPSULES BY MOUTH AS NEEDED FOR  RESTLESS  LEGS   levothyroxine (SYNTHROID) 88 MCG tablet Take 1.5 tabs on Mon/Wed/Fri, take 1 tab all other days. Take on an empty stomach first thing in the morning with water only, no other foods/drinks/meds for at least 30 min.   mometasone (NASONEX) 50 MCG/ACT nasal spray 2 sprays, Nasal, Daily   Phenylephrine-APAP-guaiFENesin (TYLENOL SINUS SEVERE PO) Oral   rosuvastatin (CRESTOR) 20 MG tablet Take 1 tab daily in the evening for cholesterol to help reduce heart attack/stroke risk.   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 09/12/2021   Abnormal glucose 11/24/2018   Labile hypertension 03/05/2018   Tobacco abuse 01/16/2015   TMJ (dislocation of temporomandibular joint) 01/16/2015   Vitamin D deficiency    Hepatomegaly    Mixed hyperlipidemia    Panic disorder    Anal fissure    Depression, major, in remission (HCC)    Hypothyroidism    Morbid obesity (HCC) - BMI 35+ with htn, hld      Immunization History  Administered Date(s) Administered   Influenza Split 08/01/2013   Td 11/18/2012   Preventative care: Tetanus: 11/2012 Pneumovax: declines Pneum 20: declines Flu vaccines declines Shingrix: will consider  Covid 19: declines   Pap:  10/2019, Dr. Hyacinth Meeker MGM: 05/2021 DEXA: 2008, heel neg  Colonoscopy: 11/2014 due 10 years EGD: N/A  PFT: 01/2013 Stress test: 02/2010 Eden Emms)  CTA chest: 05/09/2021, no nodules, aortic atherosclerosis   Names of Other Physician/Practitioners you currently use: 1. [facility] here for primary care 2.  [facility], eye doctor, last visit 1999 3. Dr. Lequita Asal , dentist, last 2022, goes q31m   Patient Care Team: Lucky Cowboy, MD as PCP - General (Internal Medicine) Wendall Stade, MD as PCP - Cardiology (Cardiology) Wendall Stade, MD as Consulting Physician (Cardiology) Iva Boop, MD as Consulting Physician (Gastroenterology) Jerene Bears, MD as Consulting Physician (Gynecology) Cottle, Steva Ready., MD as Attending Physician (Psychiatry)    Allergies No Known Allergies  SURGICAL HISTORY She  has a past surgical history that includes Hemorroidectomy; Cholecystectomy; Left oophorectomy; and Tonsillectomy. FAMILY HISTORY Her family history includes Alcohol abuse in her sister; Breast cancer in her cousin and mother; Cirrhosis in her father; Colon cancer (age of onset: 44) in her maternal grandmother; Diabetes in her mother; Drug abuse in her brother; Heart disease in her father. SOCIAL HISTORY She  reports that she has been smoking cigarettes. She started smoking about 33 years ago. She has a 32.00 pack-year smoking history. She has never used smokeless tobacco. She reports current alcohol use. She reports that she does not use drugs.  MEDICARE WELLNESS OBJECTIVES: Physical activity:   Cardiac risk factors:   Depression/mood screen:      12/31/2021    4:30  PM  Depression screen PHQ 2/9  Decreased Interest 0  Down, Depressed, Hopeless 0  PHQ - 2 Score 0    ADLs:      No data to display            Cognitive Testing  Alert? Yes  Normal Appearance?Yes  Oriented to person? Yes  Place? Yes   Time? Yes  Recall of three objects?  Yes  Can perform simple calculations? Yes  Displays appropriate judgment?Yes  Can read the correct time from a watch face?Yes  EOL planning:       Review of Systems  Constitutional:  Negative for chills, fever, malaise/fatigue and weight loss.  HENT:  Negative for congestion, ear pain, hearing loss, sore throat and tinnitus.   Eyes:  Negative for blurred vision and double vision.  Respiratory:  Positive for shortness of breath. Negative for cough, sputum production and wheezing.   Cardiovascular:  Positive for palpitations. Negative for chest pain, orthopnea, claudication  and leg swelling.  Gastrointestinal:  Negative for abdominal pain, blood in stool, constipation, diarrhea, heartburn, melena, nausea and vomiting.  Genitourinary: Negative.   Musculoskeletal:  Positive for falls. Negative for joint pain and myalgias.  Skin:  Negative for rash.  Neurological:  Negative for dizziness, tingling, sensory change, weakness and headaches.  Endo/Heme/Allergies:  Negative for polydipsia.  Psychiatric/Behavioral:  Positive for depression. Negative for hallucinations, substance abuse and suicidal ideas. The patient is nervous/anxious. The patient does not have insomnia.   All other systems reviewed and are negative.   Objective:     Last menstrual period 08/04/2012. There is no height or weight on file to calculate BMI.  General appearance: alert, no distress, WD/WN, female HEENT: normocephalic, sclerae anicteric, TMs pearly, nares patent, no discharge or erythema, pharynx normal Oral cavity: MMM, no lesions Neck: supple, no lymphadenopathy, no thyromegaly, no masses Heart: RRR, normal S1, S2, no  murmurs Lungs: CTA bilaterally, no wheezes, rhonchi, or rales Abdomen: +bs, soft, non tender, non distended, no masses, no hepatomegaly, no splenomegaly Musculoskeletal: nontender, no swelling, no obvious deformity Extremities: no edema, no cyanosis, no clubbing Pulses: 2+ symmetric, upper and lower extremities, normal cap refill Neurological: alert, oriented x 3, CN2-12 intact, strength normal upper extremities and lower extremities, sensation normal throughout, normal gait Psychiatric: mildly anxious affect, behavior normal, pleasant     Medicare Attestation I have personally reviewed: The patient's medical and social history Their use of alcohol, tobacco or illicit drugs Their current medications and supplements The patient's functional ability including ADLs,fall risks, home safety risks, cognitive, and hearing and visual impairment Diet and physical activities Evidence for depression or mood disorders  The patient's weight, height, BMI, and visual acuity have been recorded in the chart.  I have made referrals, counseling, and provided education to the patient based on review of the above and I have provided the patient with a written personalized care plan for preventive services.     Adela GlimpseNYA Liz Pinho, NP   07/03/2022

## 2022-07-08 ENCOUNTER — Ambulatory Visit (INDEPENDENT_AMBULATORY_CARE_PROVIDER_SITE_OTHER): Payer: Medicare Other | Admitting: Nurse Practitioner

## 2022-07-08 ENCOUNTER — Encounter: Payer: Self-pay | Admitting: Nurse Practitioner

## 2022-07-08 VITALS — BP 120/72 | HR 86 | Temp 97.7°F | Ht 68.0 in | Wt 220.0 lb

## 2022-07-08 DIAGNOSIS — R6889 Other general symptoms and signs: Secondary | ICD-10-CM

## 2022-07-08 DIAGNOSIS — F325 Major depressive disorder, single episode, in full remission: Secondary | ICD-10-CM | POA: Diagnosis not present

## 2022-07-08 DIAGNOSIS — R7309 Other abnormal glucose: Secondary | ICD-10-CM | POA: Diagnosis not present

## 2022-07-08 DIAGNOSIS — E039 Hypothyroidism, unspecified: Secondary | ICD-10-CM

## 2022-07-08 DIAGNOSIS — R160 Hepatomegaly, not elsewhere classified: Secondary | ICD-10-CM

## 2022-07-08 DIAGNOSIS — R29898 Other symptoms and signs involving the musculoskeletal system: Secondary | ICD-10-CM | POA: Diagnosis not present

## 2022-07-08 DIAGNOSIS — R5382 Chronic fatigue, unspecified: Secondary | ICD-10-CM | POA: Diagnosis not present

## 2022-07-08 DIAGNOSIS — E559 Vitamin D deficiency, unspecified: Secondary | ICD-10-CM | POA: Diagnosis not present

## 2022-07-08 DIAGNOSIS — Z0000 Encounter for general adult medical examination without abnormal findings: Secondary | ICD-10-CM

## 2022-07-08 DIAGNOSIS — Z0001 Encounter for general adult medical examination with abnormal findings: Secondary | ICD-10-CM | POA: Diagnosis not present

## 2022-07-08 DIAGNOSIS — E782 Mixed hyperlipidemia: Secondary | ICD-10-CM | POA: Diagnosis not present

## 2022-07-08 DIAGNOSIS — Z79899 Other long term (current) drug therapy: Secondary | ICD-10-CM

## 2022-07-08 DIAGNOSIS — Z720 Tobacco use: Secondary | ICD-10-CM

## 2022-07-08 DIAGNOSIS — S0300XD Dislocation of jaw, unspecified side, subsequent encounter: Secondary | ICD-10-CM

## 2022-07-08 NOTE — Progress Notes (Signed)
ANNUAL MEDICARE VISIT AND FOLLOW UP  Assessment:   Annual Medicare Wellness Visit Due annually  Health maintenance reviewed  Hypothyroidism, unspecified hypothyroidism type Continue Levothyroxine. Reminded to take on an empty stomach 30-53mns before food.  Stop any Biotin Supplement 48-72 hours before next TSH level to reduce the risk of falsely low TSH levels. Continue to monitor.     Mixed hyperlipidemia Discussed lifestyle modifications. Recommended diet heavy in fruits and veggies, omega 3's. Decrease consumption of animal meats, cheeses, and dairy products. Remain active and exercise as tolerated. Continue to monitor. 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 CClovis Pucontinue meds, reports recently improved Last visit 05/12/22   Vitamin D deficiency Continue supplement Monitor and check levels  Tobacco use disorder Follows with Pulmonology, Dr. ELoanne DrillingSmoking cessation-  instruction/counseling given, counseled patient on the dangers of tobacco use, advised patient to stop smoking, and reviewed strategies to maximize success, patient not ready to quit at this time but discussed reducing use.    Other abnormal glucose Education: Reviewed 'ABCs' of diabetes management  Discussed goals to be met and/or maintained include A1C (<7) Blood pressure (<130/80) Cholesterol (LDL <70) Continue Eye Exam yearly  Continue Dental Exam Q6 mo Discussed dietary recommendations Discussed Physical Activity recommendations Check A1C   TMJ Improved; monitor   Morbid obesity (HCC) - BMI 35 with hld, htn Discussed appropriate BMI Goal of losing 1 lb per month. Diet modification. Physical activity. Encouraged/praised to build confidence.   Hepatomegaly Weight loss encouraged, monitor LFTs  Atypical chest pain/ family hx of cardiovascular disease Has had normal EKGs, non-exertional though does have  increased dyspnea  Low risk myoview 02/2010 Recent negative serial troponins during episode, felt atypical  However high risk, smoker, poor lifestyle, hld, family hx Patient very anxious; discussed CT coronary calcium score with low threshold for referral back to cardiology Also emphasized risk reduction by lifestyle change and med Will control LDL <70 Reduce tobacco, caffeine;  Go to the ER if any chest pain, shortness of breath, nausea, dizziness, severe HA, changes vision/speech  Sedentary; deconditioning; increased risk of injury related to fall/other fatigue Discussed possible long covid exacerbated by depression and COPD Will check EBV for further review of any chronic fatigue Continue to remain active. Push fluids Continue to monitor   Medication management All medications discussed and reviewed in full. All questions and concerns regarding medications addressed.    Orders Placed This Encounter  Procedures   CBC with Differential/Platelet   COMPLETE METABOLIC PANEL WITH GFR   Lipid panel   TSH   Hemoglobin A1c   VITAMIN D 25 Hydroxy (Vit-D Deficiency, Fractures)   Epstein-Barr Virus VCA Antibody Panel- [facility]    Notify office for further evaluation and treatment, questions or concerns if any reported s/s fail to improve.   The patient was advised to call back or seek an in-person evaluation if any symptoms worsen or if the condition fails to improve as anticipated.   Further disposition pending results of labs. Discussed med's effects and SE's.    I discussed the assessment and treatment plan with the patient. The patient was provided an opportunity to ask questions and all were answered. The patient agreed with the plan and demonstrated an understanding of the instructions.  Discussed med's effects and SE's. Screening labs and tests as requested with regular follow-up as recommended.  I provided 40 minutes of face-to-face time during this encounter  including counseling, chart  review, and critical decision making was preformed.   Future Appointments  Date Time Provider [place]  07/28/2022  1:45 PM Margaretha Seeds, MD [facility] None  08/14/2022  1:00 PM Cottle, Billey Co., MD [facility] None  11/04/2022  2:00 PM Darrol Jump, NP [facility] None  05/06/2023  3:00 PM Darrol Jump, NP [facility] None     Plan:   During the course of the visit the patient was educated and counseled about appropriate screening and preventive services including:   Pneumococcal vaccine  Prevnar 13 Influenza vaccine Td vaccine Screening electrocardiogram Bone densitometry screening Colorectal cancer screening Diabetes screening Glaucoma screening Nutrition counseling  Advanced directives: requested    Subjective:  Susan Bartlett is a 56 y.o. presents for follow up and AWV. She has Hepatomegaly; Mixed hyperlipidemia; Panic disorder; Anal fissure; Depression, major, in remission ([place]); Hypothyroidism; Morbid obesity ([place]) - BMI 35+ with htn, hld; Vitamin D deficiency; Tobacco abuse; TMJ (dislocation of temporomandibular joint); Labile hypertension; Abnormal glucose; B12 deficiency; Aortic arch atherosclerosis ([place]) - per CTA 04/2021; Dyspnea on minimal exertion; and Muscular deconditioning on their problem list.   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 She suffered from very severe depression (niece passed away) and was inactive for two years. She recently became more ago and has noticed she has been short of breath with activity associated with wheezing. She now follows with Dr. Clovis Pu who prescribes meds, has been working to increase activity by getting out and walk more. However notes remains weak, has noted some unsteadiness, has had past fall with injury. She is wanting to restart Wellbutrin which has been successful int he past.  She is sleeping mostly during the day and stays awake at night.   She was  reporting episodes of palpitations, had normal labs, EKG on 02/20/2021. She presented to ED 05/09/2021 with chest pain, felt atypical, ? NSAID related (has since stopped), had negative serial troponins and EKG, had CTA unremarkable other than aortic atherosclerosis and central bronchia thickening. She was Dr. Johnsie Cancel with low risk myoview in 11/29/09. She remains very anxious about her cardiac risk. Significant family hx of CVD.   She is a smoker, smoking 1.5 packs/day, since she was 56 years old, no mass or nodule was noted on CTA in 05/09/2021, Plans to continue low dose screening.  Follows Pulmonology.  Uses Albuterol only.  She does not use a maintenance inhaler.  Continues to report DOE.  She has lumbar back pain, has seen Dr. Delilah Shan at [facility] ortho, no surgery was advised. Will follow up for flares, resolved with NSAID taper.   BMI is Body mass index is 33.45 kg/m.,  Wt Readings from Last 3 Encounters:  07/08/22 220 lb (99.8 kg)  04/24/22 219 lb 3.2 oz (99.4 kg)  04/02/22 221 lb (100.2 kg)   Today their BP is BP: 120/72. She denies dyspnea, dizziness. Has had chest pain episodes.   She is on cholesterol medication, pravastatin 40 mg daily and denies myalgias. Her cholesterol is not at goal. The cholesterol last visit was:   Lab Results  Component Value Date   CHOL 220 (H) 04/02/2022   HDL 48 (L) 04/02/2022   LDLCALC 136 (H) 04/02/2022   TRIG 226 (H) 04/02/2022   CHOLHDL 4.6 04/02/2022   Last A1C in the office was:  Lab Results  Component Value Date   HGBA1C 5.3 10/31/2021   She has hx of vit D def. Patient was off of vitamin  D last check, back to taking 50000 IU 1 once weekly.  Lab Results  Component Value Date   VD25OH 34 04/02/2022   She is on thyroid medication, she is on 88 mcg 1 tab daily, on empty stomach, was taking irregularly, back to taking regularly.  Lab Results  Component Value Date   TSH 12.46 (H) 04/02/2022     Current Outpatient Medications  Medication  Instructions   albuterol (VENTOLIN HFA) 108 (90 Base) MCG/ACT inhaler INHALE 2 PUFFS BY MOUTH EVERY 6 HOURS AS NEEDED FOR WHEEZING OR SHORTNESS OF BREATH   buPROPion (WELLBUTRIN XL) 150 MG 24 hr tablet Take 1 tab daily in the morning for mood and smoking cessation.   Cholecalciferol (VITAMIN D3) 1.25 MG (50000 UT) TABS 50,000 Int'l Units, Oral, Weekly   clonazePAM (KLONOPIN) 1 mg, Oral, 3 times daily PRN   clotrimazole-betamethasone (LOTRISONE) cream 1 application , Topical, 2 times daily   gabapentin (NEURONTIN) 100 MG capsule TAKE 1 TO 3 CAPSULES BY MOUTH AS NEEDED FOR  RESTLESS  LEGS   levothyroxine (SYNTHROID) 88 MCG tablet Take 1.5 tabs on Mon/Wed/Fri, take 1 tab all other days. Take on an empty stomach first thing in the morning with water only, no other foods/drinks/meds for at least 30 min.   mometasone (NASONEX) 50 MCG/ACT nasal spray 2 sprays, Nasal, Daily   Phenylephrine-APAP-guaiFENesin (TYLENOL SINUS SEVERE PO) Oral   rosuvastatin (CRESTOR) 20 MG tablet Take 1 tab daily in the evening for cholesterol to help reduce heart attack/stroke risk.   sertraline (ZOLOFT) 250 mg, Oral, Daily   sodium chloride (OCEAN) 0.65 % SOLN nasal spray 1 spray, Each Nare, As needed     Current Problems (verified) Patient Active Problem List   Diagnosis Date Noted   Muscular deconditioning 04/24/2022   Dyspnea on minimal exertion 10/31/2021   B12 deficiency 09/12/2021   Aortic arch atherosclerosis ([place]) - per CTA 04/2021 09/12/2021   Abnormal glucose 11/24/2018   Labile hypertension 03/05/2018   Tobacco abuse 01/16/2015   TMJ (dislocation of temporomandibular joint) 01/16/2015   Vitamin D deficiency    Hepatomegaly    Mixed hyperlipidemia    Panic disorder    Anal fissure    Depression, major, in remission ([place])    Hypothyroidism    Morbid obesity ([place]) - BMI 35+ with htn, hld     Immunization History  Administered Date(s) Administered   Influenza Split 08/01/2013   Td 11/18/2012    Preventative care: Tetanus: 11/2012 Pneumovax: declines Pneum 20: declines Flu vaccines declines Shingrix: will consider  Covid 19: declines   Pap:  10/2019, Dr. Sabra Heck MGM: 05/2021 Due DEXA: 2008, heel neg  Colonoscopy: 11/2014 due 10 years EGD: N/A  PFT: 01/2013 Stress test: 02/2010 Johnsie Cancel)  CTA chest: 05/09/2021, no nodules, aortic atherosclerosis   Names of Other Physician/Practitioners you currently use: 1. [facility] here for primary care 2.  [facility], eye doctor, last visit 1999 3. Dr. Alpha Gula , dentist, last 2022, goes q62m  Patient Care Team: MUnk Pinto MD as PCP - General (Internal Medicine) NJosue Hector MD as PCP - Cardiology (Cardiology) NJosue Hector MD as Consulting Physician (Cardiology) GGatha Mayer MD as Consulting Physician (Gastroenterology) MMegan Salon MD as Consulting Physician (Gynecology) Cottle, CBilley Co, MD as Attending Physician (Psychiatry)    Allergies No Known Allergies  SURGICAL HISTORY She  has a past surgical history that includes Hemorroidectomy; Cholecystectomy; Left oophorectomy; and Tonsillectomy. FAMILY HISTORY  Her family history includes Alcohol abuse in her sister; Breast cancer in her cousin and mother; Cirrhosis in her father; Colon cancer (age of onset: 43) in her maternal grandmother; Diabetes in her mother; Drug abuse in her brother; Heart disease in her father. SOCIAL HISTORY She  reports that she has been smoking cigarettes. She started smoking about 33 years ago. She has a 32.00 pack-year smoking history. She has never used smokeless tobacco. She reports current alcohol use. She reports that she does not use drugs.  MEDICARE WELLNESS OBJECTIVES: Physical activity:   Cardiac risk factors:   Depression/mood screen:      07/08/2022    9:42 PM  Depression screen PHQ 2/9  Decreased Interest 1  Down, Depressed, Hopeless 1  PHQ - 2 Score 2   Altered sleeping 1  Tired, decreased energy 1  Change in appetite 0  Feeling bad or failure about yourself  0  Trouble concentrating 0  Moving slowly or fidgety/restless 0  Suicidal thoughts 0  PHQ-9 Score 4  Difficult doing work/chores Somewhat difficult    ADLs:     07/08/2022    9:42 PM  In your present state of health, do you have any difficulty performing the following activities:  Hearing? 0  Vision? 0  Difficulty concentrating or making decisions? 0  Walking or climbing stairs? 0  Dressing or bathing? 0  Doing errands, shopping? 0  Preparing Food and eating ? N  Using the Toilet? N  In the past six months, have you accidently leaked urine? N  Do you have problems with loss of bowel control? N  Managing your Medications? N  Managing your Finances? N  Housekeeping or managing your Housekeeping? Y  Comment chronic fatigue     Cognitive Testing  Alert? Yes  Normal Appearance?Yes  Oriented to person? Yes  Place? Yes   Time? Yes  Recall of three objects?  Yes  Can perform simple calculations? Yes  Displays appropriate judgment?Yes  Can read the correct time from a watch face?Yes  EOL planning: Does Patient Have a Medical Advance Directive?: No     Review of Systems  Constitutional:  Negative for chills, fever, malaise/fatigue and weight loss.  HENT:  Negative for congestion, ear pain, hearing loss, sore throat and tinnitus.   Eyes:  Negative for blurred vision and double vision.  Respiratory:  Positive for shortness of breath. Negative for cough, sputum production and wheezing.   Cardiovascular:  Positive for palpitations. Negative for chest pain, orthopnea, claudication and leg swelling.  Gastrointestinal:  Negative for abdominal pain, blood in stool, constipation, diarrhea, heartburn, melena, nausea and vomiting.  Genitourinary: Negative.   Musculoskeletal:  Positive for falls. Negative for joint pain and myalgias.  Skin:  Negative for rash.  Neurological:   Negative for dizziness, tingling, sensory change, weakness and headaches.  Endo/Heme/Allergies:  Negative for polydipsia.  Psychiatric/Behavioral:  Positive for depression. Negative for hallucinations, substance abuse and suicidal ideas. The patient is nervous/anxious. The patient does not have insomnia.   All other systems reviewed and are negative.   Objective:     Blood pressure 120/72, pulse 86, temperature 97.7 F (36.5 C), height _0  (1.727 m), weight 220 lb (99.8 kg), last menstrual period 08/04/2012, SpO2 99 %. Body mass index is 33.45 kg/m.  General appearance: alert, no distress, WD/WN, female HEENT: normocephalic, sclerae anicteric, TMs pearly, nares patent, no discharge or erythema, pharynx normal Oral cavity: MMM, no lesions Neck: supple, no lymphadenopathy, no thyromegaly, no masses Heart:  RRR, normal S1, S2, no murmurs Lungs: CTA bilaterally, no wheezes, rhonchi, or rales Abdomen: +bs, soft, non tender, non distended, no masses, no hepatomegaly, no splenomegaly Musculoskeletal: nontender, no swelling, no obvious deformity Extremities: no edema, no cyanosis, no clubbing Pulses: 2+ symmetric, upper and lower extremities, normal cap refill Neurological: alert, oriented x 3, CN2-12 intact, strength normal upper extremities and lower extremities, sensation normal throughout, normal gait Psychiatric: mildly anxious affect, behavior normal, pleasant     Medicare Attestation I have personally reviewed: The patient's medical and social history Their use of alcohol, tobacco or illicit drugs Their current medications and supplements The patient's functional ability including ADLs,fall risks, home safety risks, cognitive, and hearing and visual impairment Diet and physical activities Evidence for depression or mood disorders  The patient's weight, height, BMI, and visual acuity have been recorded in the chart.  I have made referrals, counseling, and provided education to the  patient based on review of the above and I have provided the patient with a written personalized care plan for preventive services.     Darrol Jump, NP   07/08/2022

## 2022-07-08 NOTE — Patient Instructions (Signed)
Chronic Fatigue Syndrome ?Chronic fatigue syndrome (CFS) is a condition that causes extreme tiredness (fatigue). This condition is also known as myalgic encephalomyelitis (ME). The fatigue in CFS does not improve with rest, and it gets worse with physical or mental activity. Several other symptoms may occur along with fatigue. ?Symptoms may come and go, but they generally last for months. Sometimes, CFS gets better over time. In other cases, it can be a lifelong condition. There is no cure, but there are many possible treatments. You will need to work with your health care providers to find a treatment plan that works best for you. ?What are the causes? ?The cause of this condition is not known. CFS may be caused by a combination of things. Possible causes include: ?An infection. ?An abnormal body defense system (abnormal immune system). ?Changes in how your body produces energy. ?Genetic factors. ?Physical or emotional stress. ?Having a poor diet. ?What increases the risk? ?You are more likely to develop this condition if: ?You are female. ?You are a young adult or middle-aged adult. ?You have a family history of CFS. ?What are the signs or symptoms? ?The main symptom of this condition is fatigue that is severe enough to interfere with day-to-day activities. This fatigue does not get better with rest, and it gets worse with physical or mental activity. There are eight other major symptoms of CFS: ?Discomfort and lack of energy (malaise) that lasts more than 24 hours after physical activity. ?Sleep that does not relieve fatigue (unrefreshing sleep). ?Short-term memory loss or confusion. ?Joint pain without redness or swelling. ?Muscle aches. ?Headaches. ?Painful and swollen glands (lymph nodes) in the neck or under the arms. ?Sore throat. ?Other symptoms can include: ?Cramps in the abdomen, constipation, or diarrhea (irritable bowel syndrome). ?Chills and night sweats. ?Vision changes. ?Dizziness and mental  confusion (brain fog). ?Clumsiness. ?Sensitivity to food, noise, or odors. ?Mood swings, depression, or anxiety attacks. ?How is this diagnosed? ?There are no tests that can diagnose this condition. Your health care provider will make the diagnosis based on: ?Your symptoms and medical history. ?A physical exam and a mental health exam. ?Tests to rule out other conditions. It is important to make sure that your symptoms are not caused by another medical condition. Tests may include lab tests or X-rays. ?For your health care provider to diagnose CFS: ?You must have had fatigue for at least 6 months in a row. ?Fatigue must be your first symptom, and it must be severe enough to interfere with day-to-day activities. ?You must also have at least four of the eight other major symptoms of CFS. ?There must be no other cause found for the fatigue. ?How is this treated? ?There is no cure for CFS. The condition affects everyone differently. You will need to work with your team of health care providers to find the best treatments for your symptoms. Your team may include your primary care provider, physical and exercise therapists, and mental health therapists. Treatment may include: ?Having a regular bedtime routine to help improve your sleep. ?Avoiding caffeine and alcohol. ?Doing light exercise and stretching during the day. You may also want to try movement exercises, such as yoga or tai chi. ?Taking medicines to help you sleep or to relieve joint or muscle pain. ?Learning and practicing relaxation techniques, such as deep breathing and muscle relaxation. ?Using memory aids or doing puzzles to improve memory and concentration. ?Getting care for your body and mental well-being, such as: ?Seeing a mental health therapist to evaluate and   treat depression, if necessary. ?Cognitive behavioral therapy (CBT). This therapy changes the way you think or act in response to the fatigue. This may help improve how you feel. ?Trying massage  therapy and acupuncture. ?Follow these instructions at home: ?Eating and drinking ? ?Avoid caffeine and alcohol. ?Avoid heavy meals in the evening. ?Eat a healthy diet that includes foods such as vegetables, fruits, fish, and lean meats. ?Activity ?Rest as told by your health care provider. ?Avoid fatigue by pacing yourself during the day and getting enough sleep at night. ?Exercise as told by your health care provider. ?Go to bed and get up at the same time every day. ?Lifestyle ?Ask your health care provider whether you should keep a journal. Your health care provider will tell you what information to write in the journal. This may include when you have fatigue and how medicines and other behaviors or treatments help to reduce the fatigue. ?Consider joining a CFS support group. ?Avoid stress, and use stress-reducing techniques that you learn in therapy. ?General instructions ?Take over-the-counter and prescription medicines only as told by your health care provider. ?Do not use herbal or dietary supplements unless they are approved by your health care provider. ?Maintain a healthy weight. ?Keep all follow-up visits. This is important. ?Where to find more information ?Get more information or find a support group near you at one of these links: ?American Myalgic Encephalomyelitis and Chronic Fatigue Syndrome Society: ammes.org ?Centers for Disease Control and Prevention: www.cdc.gov ?Contact a health care provider if: ?Your symptoms do not get better or they get worse. ?You feel angry, guilty, anxious, or depressed. ?Get help right away if: ?You have thoughts about hurting yourself or others. ?Get help right away if you feel like you may hurt yourself or others, or have thoughts about taking your own life. Go to your nearest emergency room or: ?Call 911. ?Call the National Suicide Prevention Lifeline at 1-800-273-8255 or 988. This is open 24 hours a day. ?Text the Crisis Text Line at 741741. ?Summary ?Chronic  fatigue syndrome (CFS) is a condition that causes extreme tiredness (fatigue). This fatigue does not improve with rest, and it gets worse with physical or mental activity. ?There is no cure for CFS. The condition affects everyone differently. You will need to work with your team of health care providers to find the best treatments for your symptoms. ?Avoid stress, and use stress-reducing techniques that you learn in therapy. ?Contact a health care provider if your symptoms do not get better or they get worse. ?This information is not intended to replace advice given to you by your health care provider. Make sure you discuss any questions you have with your health care provider. ?Document Revised: 07/22/2021 Document Reviewed: 07/22/2021 ?Elsevier Patient Education ? 2023 [employer] ? ?

## 2022-07-09 ENCOUNTER — Encounter: Payer: Self-pay | Admitting: Nurse Practitioner

## 2022-07-09 LAB — COMPLETE METABOLIC PANEL WITH GFR
AG Ratio: 2 (calc) (ref 1.0–2.5)
ALT: 15 U/L (ref 6–29)
AST: 18 U/L (ref 10–35)
Albumin: 4.8 g/dL (ref 3.6–5.1)
Alkaline phosphatase (APISO): 85 U/L (ref 37–153)
BUN: 7 mg/dL (ref 7–25)
CO2: 27 mmol/L (ref 20–32)
Calcium: 10.3 mg/dL (ref 8.6–10.4)
Chloride: 98 mmol/L (ref 98–110)
Creat: 0.84 mg/dL (ref 0.50–1.03)
Globulin: 2.4 g/dL (calc) (ref 1.9–3.7)
Glucose, Bld: 78 mg/dL (ref 65–99)
Potassium: 4.5 mmol/L (ref 3.5–5.3)
Sodium: 135 mmol/L (ref 135–146)
Total Bilirubin: 0.6 mg/dL (ref 0.2–1.2)
Total Protein: 7.2 g/dL (ref 6.1–8.1)
eGFR: 81 mL/min/{1.73_m2} (ref >=60)

## 2022-07-09 LAB — LIPID PANEL
Cholesterol: 216 mg/dL — ABNORMAL HIGH (ref <200)
HDL: 48 mg/dL — ABNORMAL LOW (ref >=50)
LDL Cholesterol (Calc): 127 mg/dL (calc) — ABNORMAL HIGH
Non-HDL Cholesterol (Calc): 168 mg/dL (calc) — ABNORMAL HIGH (ref <130)
Total CHOL/HDL Ratio: 4.5 (calc) (ref <5.0)
Triglycerides: 266 mg/dL — ABNORMAL HIGH (ref <150)

## 2022-07-09 LAB — EPSTEIN-BARR VIRUS VCA ANTIBODY PANEL
EBV NA IgG: >600 U/mL — ABNORMAL HIGH
EBV VCA IgG: 410 U/mL — ABNORMAL HIGH
EBV VCA IgM: <36 U/mL

## 2022-07-09 LAB — CBC WITH DIFFERENTIAL/PLATELET
Absolute Monocytes: 444 cells/uL (ref 200–950)
Basophils Absolute: 81 cells/uL (ref 0–200)
Basophils Relative: 1.1 %
Eosinophils Absolute: 333 cells/uL (ref 15–500)
Eosinophils Relative: 4.5 %
HCT: 43.1 % (ref 35.0–45.0)
Hemoglobin: 14.8 g/dL (ref 11.7–15.5)
Lymphs Abs: 1717 cells/uL (ref 850–3900)
MCH: 31.6 pg (ref 27.0–33.0)
MCHC: 34.3 g/dL (ref 32.0–36.0)
MCV: 92.1 fL (ref 80.0–100.0)
MPV: 11 fL (ref 7.5–12.5)
Monocytes Relative: 6 %
Neutro Abs: 4825 cells/uL (ref 1500–7800)
Neutrophils Relative %: 65.2 %
Platelets: 290 10*3/uL (ref 140–400)
RBC: 4.68 10*6/uL (ref 3.80–5.10)
RDW: 13.2 % (ref 11.0–15.0)
Total Lymphocyte: 23.2 %
WBC: 7.4 10*3/uL (ref 3.8–10.8)

## 2022-07-09 LAB — HEMOGLOBIN A1C
Hgb A1c MFr Bld: 5.2 % of total Hgb (ref <5.7)
Mean Plasma Glucose: 103 mg/dL
eAG (mmol/L): 5.7 mmol/L

## 2022-07-09 LAB — VITAMIN D 25 HYDROXY (VIT D DEFICIENCY, FRACTURES): Vit D, 25-Hydroxy: 28 ng/mL — ABNORMAL LOW (ref 30–100)

## 2022-07-09 LAB — TSH: TSH: 11.47 mIU/L — ABNORMAL HIGH (ref 0.40–4.50)

## 2022-07-14 ENCOUNTER — Telehealth: Payer: Self-pay | Admitting: Nurse Practitioner

## 2022-07-14 NOTE — Telephone Encounter (Signed)
Pt doesn't feel that tonya is understanding what she is saying, wanting either melissa or tonya to call her directly

## 2022-07-28 ENCOUNTER — Ambulatory Visit: Payer: Medicare Other | Admitting: Pulmonary Disease

## 2022-08-14 ENCOUNTER — Encounter: Payer: Self-pay | Admitting: Psychiatry

## 2022-08-14 ENCOUNTER — Telehealth (INDEPENDENT_AMBULATORY_CARE_PROVIDER_SITE_OTHER): Payer: Medicare Other | Admitting: Psychiatry

## 2022-08-14 DIAGNOSIS — F411 Generalized anxiety disorder: Secondary | ICD-10-CM | POA: Diagnosis not present

## 2022-08-14 DIAGNOSIS — F4001 Agoraphobia with panic disorder: Secondary | ICD-10-CM | POA: Diagnosis not present

## 2022-08-14 DIAGNOSIS — G2581 Restless legs syndrome: Secondary | ICD-10-CM

## 2022-08-14 DIAGNOSIS — F5105 Insomnia due to other mental disorder: Secondary | ICD-10-CM

## 2022-08-14 DIAGNOSIS — R7989 Other specified abnormal findings of blood chemistry: Secondary | ICD-10-CM

## 2022-08-14 DIAGNOSIS — F422 Mixed obsessional thoughts and acts: Secondary | ICD-10-CM | POA: Diagnosis not present

## 2022-08-14 DIAGNOSIS — F331 Major depressive disorder, recurrent, moderate: Secondary | ICD-10-CM | POA: Diagnosis not present

## 2022-08-14 MED ORDER — CLONAZEPAM 1 MG PO TABS: 1.0000 mg | ORAL_TABLET | Freq: Three times a day (TID) | ORAL | 5 refills | Status: DC | PRN
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
## 2022-09-16 ENCOUNTER — Encounter: Payer: Medicare Other | Admitting: Adult Health

## 2022-09-26 ENCOUNTER — Telehealth: Payer: Self-pay | Admitting: Psychiatry
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
## 2022-09-26 NOTE — Telephone Encounter (Signed)
Pt has been taking Wellbutrin 150 mg for 14 days.She said that you had her on it before to help with smoking so she asked her PCP to prescribe.She had no issues with it until yesterday she became dizzy after smoking a cigarette and is not sure if Wellbutrin caused that.She also wants to know if she is allowed to have a glass of champagne on new years with the meds she's on.

## 2022-09-29 NOTE — Telephone Encounter (Signed)
Usually SE Wellbutrin show up within 3-4 days not this long later.  I would suggest she give it more time to see if this is related to dizziness.  She could have 1 glass of champagne.

## 2022-09-30 NOTE — Telephone Encounter (Signed)
LVM with info and to rtc with questions 

## 2022-10-02 ENCOUNTER — Telehealth: Payer: Self-pay | Admitting: Psychiatry

## 2022-10-02 NOTE — Telephone Encounter (Signed)
Pt called at 4:22p.  She is wanting a call back to discuss how to manage the Wellbutrin.  She's been on it 21 days.  She is more panicky and she wants to know if she needs to increase the Klonopin and/or stop the Wellbutrin.   No upcoming appts scheduled.

## 2022-10-03 NOTE — Telephone Encounter (Signed)
Pt stated she is having a lot of anxiety since starting Wellbutrin.She is taking it to help stop smoking,but she has been smoking more often due to the anxiety.She also called last week saying she had a dizzy spell.She stated if she is going to wean off med she wants to start before christmas.

## 2022-10-03 NOTE — Telephone Encounter (Signed)
Abruptly stop Wellbutrin DT SE

## 2022-10-03 NOTE — Telephone Encounter (Signed)
Pt informed

## 2022-10-03 NOTE — Telephone Encounter (Signed)
LVM to rtc 

## 2022-10-19 ENCOUNTER — Other Ambulatory Visit (HOSPITAL_BASED_OUTPATIENT_CLINIC_OR_DEPARTMENT_OTHER): Payer: Self-pay | Admitting: Obstetrics & Gynecology

## 2022-10-23 NOTE — Telephone Encounter (Signed)
LMOVM for pt to call office regarding refill request 

## 2022-10-24 NOTE — Telephone Encounter (Signed)
LMOVM for pt to call regarding refill request 

## 2022-10-26 ENCOUNTER — Other Ambulatory Visit: Payer: Self-pay | Admitting: Internal Medicine

## 2022-10-26 DIAGNOSIS — R7989 Other specified abnormal findings of blood chemistry: Secondary | ICD-10-CM

## 2022-10-26 MED ORDER — LEVOTHYROXINE SODIUM 88 MCG PO TABS: ORAL_TABLET | ORAL | 1 refills | Status: DC

## 2022-10-29 ENCOUNTER — Other Ambulatory Visit (HOSPITAL_BASED_OUTPATIENT_CLINIC_OR_DEPARTMENT_OTHER): Payer: Self-pay | Admitting: *Deleted

## 2022-10-29 ENCOUNTER — Other Ambulatory Visit: Payer: Self-pay | Admitting: Internal Medicine

## 2022-10-29 DIAGNOSIS — R7989 Other specified abnormal findings of blood chemistry: Secondary | ICD-10-CM

## 2022-10-29 MED ORDER — LEVOTHYROXINE SODIUM 88 MCG PO TABS: ORAL_TABLET | ORAL | 1 refills | Status: DC

## 2022-10-29 MED ORDER — VITAMIN D3 1.25 MG (50000 UT) PO TABS: 50000.0000 [IU] | ORAL_TABLET | ORAL | 0 refills | Status: DC

## 2022-11-04 ENCOUNTER — Encounter: Payer: Self-pay | Admitting: Nurse Practitioner

## 2022-11-04 ENCOUNTER — Encounter: Payer: Medicare Other | Admitting: Nurse Practitioner

## 2022-11-04 ENCOUNTER — Ambulatory Visit (INDEPENDENT_AMBULATORY_CARE_PROVIDER_SITE_OTHER): Payer: Medicare Other | Admitting: Nurse Practitioner

## 2022-11-04 VITALS — BP 138/74 | HR 99 | Temp 97.9°F | Ht 67.5 in | Wt 229.0 lb

## 2022-11-04 DIAGNOSIS — Z1389 Encounter for screening for other disorder: Secondary | ICD-10-CM

## 2022-11-04 DIAGNOSIS — I10 Essential (primary) hypertension: Secondary | ICD-10-CM | POA: Diagnosis not present

## 2022-11-04 DIAGNOSIS — Z0000 Encounter for general adult medical examination without abnormal findings: Secondary | ICD-10-CM

## 2022-11-04 DIAGNOSIS — F325 Major depressive disorder, single episode, in full remission: Secondary | ICD-10-CM

## 2022-11-04 DIAGNOSIS — Z136 Encounter for screening for cardiovascular disorders: Secondary | ICD-10-CM | POA: Diagnosis not present

## 2022-11-04 DIAGNOSIS — R7309 Other abnormal glucose: Secondary | ICD-10-CM

## 2022-11-04 DIAGNOSIS — Z0001 Encounter for general adult medical examination with abnormal findings: Secondary | ICD-10-CM

## 2022-11-04 DIAGNOSIS — Z720 Tobacco use: Secondary | ICD-10-CM

## 2022-11-04 DIAGNOSIS — R5382 Chronic fatigue, unspecified: Secondary | ICD-10-CM

## 2022-11-04 DIAGNOSIS — Z1231 Encounter for screening mammogram for malignant neoplasm of breast: Secondary | ICD-10-CM

## 2022-11-04 DIAGNOSIS — S0300XD Dislocation of jaw, unspecified side, subsequent encounter: Secondary | ICD-10-CM

## 2022-11-04 DIAGNOSIS — L918 Other hypertrophic disorders of the skin: Secondary | ICD-10-CM

## 2022-11-04 DIAGNOSIS — E039 Hypothyroidism, unspecified: Secondary | ICD-10-CM

## 2022-11-04 DIAGNOSIS — B351 Tinea unguium: Secondary | ICD-10-CM

## 2022-11-04 DIAGNOSIS — F17200 Nicotine dependence, unspecified, uncomplicated: Secondary | ICD-10-CM

## 2022-11-04 DIAGNOSIS — Z79899 Other long term (current) drug therapy: Secondary | ICD-10-CM | POA: Diagnosis not present

## 2022-11-04 DIAGNOSIS — E782 Mixed hyperlipidemia: Secondary | ICD-10-CM

## 2022-11-04 DIAGNOSIS — Z130 Encounter for screening for diseases of the blood and blood-forming organs and certain disorders involving the immune mechanism: Secondary | ICD-10-CM

## 2022-11-04 DIAGNOSIS — Z1283 Encounter for screening for malignant neoplasm of skin: Secondary | ICD-10-CM

## 2022-11-04 DIAGNOSIS — E559 Vitamin D deficiency, unspecified: Secondary | ICD-10-CM | POA: Diagnosis not present

## 2022-11-04 DIAGNOSIS — R160 Hepatomegaly, not elsewhere classified: Secondary | ICD-10-CM

## 2022-11-04 MED ORDER — VARENICLINE TARTRATE 0.5 MG PO TABS: 0.5000 mg | ORAL_TABLET | Freq: Two times a day (BID) | ORAL | 0 refills | Status: DC

## 2022-11-04 NOTE — Progress Notes (Signed)
CPE  Assessment:   Encounter for Annual Physical Exam with abnormal findings Due annually  Health Maintenance reviewed Healthy lifestyle reviewed and goals set  Hypothyroidism, unspecified hypothyroidism type Uncontrolled Continue Levothyroxine. Reminded to take on an empty stomach 30-23mins before food.  Stop any Biotin Supplement 48-72 hours before next TSH level to reduce the risk of falsely low TSH levels. Continue to monitor.     Mixed hyperlipidemia Discussed lifestyle modifications. Recommended diet heavy in fruits and veggies, omega 3's. Decrease consumption of animal meats, cheeses, and dairy products. Remain active and exercise as tolerated. Continue to monitor. 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   Due for CT Lung Cancer Screening -- ordered  Other abnormal glucose Education: Reviewed 'ABCs' of diabetes management  Discussed goals to be met and/or maintained include A1C (<7) Blood pressure (<130/80) Cholesterol (LDL <70) Continue Eye Exam yearly  Continue Dental Exam Q6 mo Discussed dietary recommendations Discussed Physical Activity recommendations Check A1C   TMJ Improved; monitor   Morbid obesity (HCC) - BMI 35 with hld, htn Discussed appropriate BMI Goal of losing 1 lb per month. Diet modification. Physical activity. Encouraged/praised to build confidence.   Hepatomegaly Weight loss encouraged, monitor LFTs Avoid tylenol, alcohol, greasy foods  Screening  for breast cancer Mammogram ordered  Screening for proteinuria or hematuria UA/Microalbumin  Medication management All medications discussed and reviewed in full. All questions and concerns regarding medications addressed.    Chronic fatigue EBV positive, discussed possible long covid, requesting levels to be re-checked to assess for decrease.  Screening for IDA Assess anemia panel  Referral to dermatology Removal of skin tag Full body skin check  Nail fungus Apply topical OTC polish Utilize Clotrimazole cream/Lamisil around nail and cuticle region Keep feet dry and free of moisture Continue to monitor  Screening for cardiovascular condition EKG  Orders Placed This Encounter  Procedures   CT CHEST LUNG CA SCREEN LOW DOSE W/O CM    Standing Status:   Future    Standing Expiration Date:   11/05/2023    Order Specific Question:   Preferred Imaging Location?    Answer:   [id number]    Order Specific Question:   Is patient pregnant?    Answer:   No   MM Digital Screening    Standing Status:   Future    Standing Expiration Date:   11/05/2023    Order Specific Question:   Reason for Exam (SYMPTOM  OR DIAGNOSIS REQUIRED)    Answer:   Annual screening    Order Specific Question:   Is the patient pregnant?    Answer:   No    Order Specific Question:   Preferred imaging location?    Answer:   [facility]   CBC with Differential/Platelet   COMPLETE METABOLIC PANEL WITH GFR   Magnesium   Lipid panel   TSH   Hemoglobin A1c   Insulin, random   VITAMIN D 25 Hydroxy (Vit-D Deficiency, Fractures)   Urinalysis, Routine w reflex microscopic   Microalbumin / creatinine urine ratio   T4, Free  T3, free   Iron, TIBC and Ferritin Panel   Epstein-Barr Virus VCA Antibody Panel- [facility]   Ambulatory referral to Dermatology    Referral Priority:   Routine    Referral Type:   Consultation    Referral Reason:   Specialty Services Required    Requested  Specialty:   Dermatology    Number of Visits Requested:   1   EKG 12-Lead     Notify office for further evaluation and treatment, questions or concerns if any reported s/s fail to improve.   The patient was advised to call back or seek an in-person evaluation if any symptoms worsen or if the condition fails to improve as anticipated.   Further disposition pending results of labs. Discussed med's effects and SE's.    I discussed the assessment and treatment plan with the patient. The patient was provided an opportunity to ask questions and all were answered. The patient agreed with the plan and demonstrated an understanding of the instructions.  Discussed med's effects and SE's. Screening labs and tests as requested with regular follow-up as recommended.  I provided 40 minutes of face-to-face time during this encounter including counseling, chart review, and critical decision making was preformed.   Future Appointments  Date Time Provider [place]  12/16/2022  1:35 PM Megan Salon, MD [facility]  05/06/2023  3:00 PM Darrol Jump, NP [facility] None  11/05/2023  2:00 PM Darrol Jump, NP [facility] None    Subjective:  Susan Bartlett is a 57 y.o. presents for follow up and CPE. 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  She shares with me today that she has stopped the Wellbutrin d/t having a panic attack while driving.  She had to pull off the side of the road. She had to be picked up from that location and stopped the Wellbutrin.  She started smoking.  She has tried Chantix in  the past and feels as though this will help her moving forward.  She is requesting to start this medication.  She is a smoker, was smoking 1.5 packs/day, down to 1 pack/day, since she was 57 years old, no mass or nodule was noted on CTA in 05/09/2021, she is due for update and  will plan to start annual low dose screening CT.  Last OV she reported was very sedentary (barely getting out of bed) from 12/2019 after her niece passed until 12/20/2020, 1 year due to severe depression and panic disorder (follows with Dr. Clovis Pu who prescribes meds, currently taking sertraline 250 mg daily, added wellbutrin but hasn't started due to anxiety about new medication), she was checked for EBV after having Covid and found to have an elevation in level.  She is having some improvement in fatigue and wanting to check to see if EBV levels have improved.    She has followed with Cardiology in the past for palpitations. She was Dr. Johnsie Cancel with low risk myoview in 2011 but has not followed up since. Significant family hx of CVD. Underlying contributor could be r/t thyroid disease and levels that are not WNL.  She has noticed her right great toe with a small area of the nail  along the lateral side of the toenail becoming more discolored.  She has not treated with any remedies.    She is also concerned for a skin tag along the back side of her neck.  She does not follow with dermatology.  Has not had a full body skin check.    BMI is Body mass index is 35.34 kg/m., she has not been working on diet and exercise, TSH levels continue to remain outside of normal range. Wt Readings from Last 3 Encounters:  11/04/22 229 lb (103.9 kg)  07/08/22 220 lb (99.8 kg)  04/24/22 219 lb 3.2 oz (99.4 kg)   Today their BP is BP: 138/74. She denies dyspnea, dizziness. Has had chest pain episodes.   She is on cholesterol medication, rosuvastatin 40 mg daily was above goal,. Her cholesterol is not at goal. The cholesterol last visit was:    Lab Results  Component Value Date   CHOL 216 (H) 07/08/2022   HDL 48 (L) 07/08/2022   LDLCALC 127 (H) 07/08/2022   TRIG 266 (H) 07/08/2022   CHOLHDL 4.5 07/08/2022   Last A1C in the office was:  Lab Results  Component Value Date   HGBA1C 5.2 07/08/2022   She has hx of vit D def. Patient was off of vitamin D last check, back to taking 50000 IU 1 once weekly.  Lab Results  Component Value Date   VD25OH 28 (L) 07/08/2022   She is on thyroid medication, Last level elevated.  Has seen Endocrinology in the past.  Does not currently follow up Lab Results  Component Value Date   TSH 11.47 (H) 07/08/2022   Lab Results  Component Value Date   VITAMINB12 248 10/31/2021     Current Outpatient Medications  Medication Instructions   albuterol (VENTOLIN HFA) 108 (90 Base) MCG/ACT inhaler INHALE 2 PUFFS BY MOUTH EVERY 6 HOURS AS NEEDED FOR WHEEZING OR SHORTNESS OF BREATH   Cholecalciferol (VITAMIN D3) 1.25 MG (50000 UT) TABS 50,000 Int'l Units, Oral, Weekly   clonazePAM (KLONOPIN) 1 mg, Oral, 3 times daily PRN, 1/2 tablet twice daily and one tablet at night   clotrimazole-betamethasone (LOTRISONE) cream 1 application , Topical, 2 times daily   gabapentin (NEURONTIN) 100 MG capsule TAKE 1 TO 3 CAPSULES BY MOUTH AS NEEDED FOR  RESTLESS  LEGS   levothyroxine (SYNTHROID) 88 MCG tablet Take 1.5 tabs on Mon/Wed/Fri, take 1 tab all other days. Take on an empty stomach first thing in the morning with water only, no other foods/drinks/meds for at least 30 min.                                                                           /                                                       TAKE                                  BY  MOUTH   mometasone (NASONEX) 50 MCG/ACT nasal spray 2 sprays, Nasal, Daily   Phenylephrine-APAP-guaiFENesin (TYLENOL SINUS SEVERE PO) Oral   rosuvastatin (CRESTOR) 20 MG tablet Take 1 tab daily in the evening for cholesterol to help reduce  heart attack/stroke risk.   sertraline (ZOLOFT) 250 mg, Oral, Daily   sodium chloride (OCEAN) 0.65 % SOLN nasal spray 1 spray, Each Nare, As needed   triamcinolone (NASACORT) 55 MCG/ACT AERO nasal inhaler 2 sprays, Nasal, Daily   varenicline (CHANTIX) 0.5 mg, Oral, 2 times daily     Current Problems (verified) Patient Active Problem List   Diagnosis Date Noted   Muscular deconditioning 04/24/2022   Dyspnea on minimal exertion 10/31/2021   B12 deficiency 09/12/2021   Aortic arch atherosclerosis (HCC) - per CTA 04/2021 09/12/2021   Abnormal glucose 11/24/2018   Labile hypertension 03/05/2018   Tobacco abuse 01/16/2015   TMJ (dislocation of temporomandibular joint) 01/16/2015   Vitamin D deficiency    Hepatomegaly    Mixed hyperlipidemia    Panic disorder    Anal fissure    Depression, major, in remission (HCC)    Hypothyroidism    Morbid obesity (HCC) - BMI 35+ with htn, hld     Immunization History  Administered Date(s) Administered   Influenza Split 08/01/2013   Td 11/18/2012   Health Maintenance  Topic Date Due   Medicare Annual Wellness (AWV)  Never done   COVID-19 Vaccine (1) Never done   Zoster Vaccines- Shingrix (1 of 2) Never done   INFLUENZA VACCINE  05/13/2022   PAP SMEAR-Modifier  10/16/2022   DTaP/Tdap/Td (2 - Tdap) 11/18/2022   Lung Cancer Screening  01/07/2023   MAMMOGRAM  06/07/2023   COLONOSCOPY (Pts 45-16yrs Insurance coverage will need to be confirmed)  11/17/2024   HIV Screening  Completed   HPV VACCINES  Aged Out   Hepatitis C Screening  Discontinued   Preventative care: Tetanus: 11/2012 Pneumovax: declines Pneum 20: declines Flu vaccines declines Shingrix: will consider  Covid 19: declines    Pap:  10/2019, Dr. Hyacinth Meeker MGM: 05/2021 Due DEXA: 2008, heel neg   Colonoscopy: 11/2014 due 10 years EGD: N/A  PFT: 01/2013 Stress test: 02/2010 [facility])  CTA chest: 05/09/2021, no nodules, aortic atherosclerosis - due  Names of Other  Physician/Practitioners you currently use: 1. [facility] here for primary care 2.  [facility], eye doctor, last visit 1999, encouraged to schedule 3. Dr. Lequita Asal , dentist, last 2022, goes q46m   Patient Care Team: Lucky Cowboy, MD as PCP - General (Internal Medicine) Wendall Stade, MD as PCP - Cardiology (Cardiology) Wendall Stade, MD as Consulting Physician (Cardiology) Iva Boop, MD as Consulting Physician (Gastroenterology) Jerene Bears, MD as Consulting Physician (Gynecology) Cottle, Steva Ready., MD as Attending Physician (Psychiatry)    Allergies No Known Allergies  SURGICAL HISTORY She  has a past surgical history that includes Hemorroidectomy; Cholecystectomy; Left oophorectomy; and Tonsillectomy. FAMILY HISTORY Her family history includes Alcohol abuse in her sister; Breast cancer in her cousin and mother; Cirrhosis in her father; Colon cancer (age of onset: 11) in her maternal grandmother; Diabetes in her mother; Drug abuse in her brother; Heart disease in her father. SOCIAL HISTORY She  reports that she has been smoking cigarettes. She started smoking about 34 years ago. She has a 32.00 pack-year smoking history. She has never used smokeless tobacco. She reports current alcohol use. She reports that she does not use drugs.  Review of Systems  Constitutional:  Negative for chills, fever, malaise/fatigue and weight loss.  HENT:  Negative for congestion, ear pain, hearing loss, sore throat and tinnitus.   Eyes:  Negative for blurred vision and double vision.  Respiratory:  Negative for cough, sputum production, shortness of breath and wheezing.   Cardiovascular:  Negative for chest pain, palpitations, orthopnea, claudication and leg swelling.  Gastrointestinal:  Negative for abdominal pain, blood in stool, constipation, diarrhea, heartburn, melena, nausea and vomiting.  Genitourinary: Negative.    Musculoskeletal:  Negative for falls, joint pain and myalgias.  Skin:  Negative for rash.  Neurological:  Negative for dizziness, tingling, sensory change, weakness and headaches.  Endo/Heme/Allergies:  Negative for polydipsia.  Psychiatric/Behavioral:  Positive for depression. Negative for hallucinations, substance abuse and suicidal ideas. The patient is nervous/anxious. The patient does not have insomnia.   All other systems reviewed and are negative.   Objective:     Blood pressure 138/74, pulse 99, temperature 97.9 F (36.6 C), height 5' 7.5" (1.715 m), weight 229 lb (103.9 kg), last menstrual period 08/04/2012, SpO2 98 %. Body mass index is 35.34 kg/m.  General appearance: alert, no distress, WD/WN, female HEENT: normocephalic, sclerae anicteric, TMs pearly, nares patent, no discharge or erythema, pharynx normal. Soft palate without significant lesion or ulceration, tongue with some white/brown coating.  Oral cavity: MMM, no lesions Neck: supple, no lymphadenopathy, no thyromegaly, no masses Heart: RRR, normal S1, S2, no murmurs Lungs: CTA bilaterally, no wheezes, rhonchi, or rales Abdomen: +bs, soft, non tender, non distended, no masses, no hepatomegaly, no splenomegaly Musculoskeletal: nontender, no swelling, no obvious deformity Extremities: no edema, no cyanosis, no clubbing Pulses: 2+ symmetric, upper and lower extremities, normal cap refill Neurological: alert, oriented x 3, CN2-12 intact, strength normal upper extremities and lower extremities, sensation normal throughout, normal gait Psychiatric: very anxious affect, non-pressured speech, behavior normal, pleasant  Skin: posterior neck with small skin tag x 1 surrounding area WNL.  Warm.  Appropriate color for ethnicity. RLE great toe with mild onychomycosis.    EKG: NSR, NSCPT   Linnie Delgrande, NP   11/04/2022

## 2022-11-04 NOTE — Patient Instructions (Addendum)
Any over the counter topical antifungal polish can help with any toenail fungus. Topical antifungal cream can also help such as Lamisil.    Healthy Eating Following a healthy eating pattern may help you to achieve and maintain a healthy body weight, reduce the risk of chronic disease, and live a long and productive life. It is important to follow a healthy eating pattern at an appropriate calorie level for your body. Your nutritional needs should be met primarily through food by choosing a variety of nutrient-rich foods. What are tips for following this plan? Reading food labels Read labels and choose the following: Reduced or low sodium. Juices with 100% fruit juice. Foods with low saturated fats and high polyunsaturated and monounsaturated fats. Foods with whole grains, such as whole wheat, cracked wheat, brown rice, and wild rice. Whole grains that are fortified with folic acid. This is recommended for women who are pregnant or who want to become pregnant. Read labels and avoid the following: Foods with a lot of added sugars. These include foods that contain brown sugar, corn sweetener, corn syrup, dextrose, fructose, glucose, high-fructose corn syrup, honey, invert sugar, lactose, malt syrup, maltose, molasses, raw sugar, sucrose, trehalose, or turbinado sugar. Do not eat more than the following amounts of added sugar per day: 6 teaspoons (25 g) for women. 9 teaspoons (38 g) for men. Foods that contain processed or refined starches and grains. Refined grain products, such as white flour, degermed cornmeal, white bread, and white rice. Shopping Choose nutrient-rich snacks, such as vegetables, whole fruits, and nuts. Avoid high-calorie and high-sugar snacks, such as potato chips, fruit snacks, and candy. Use oil-based dressings and spreads on foods instead of solid fats such as butter, stick margarine, or cream cheese. Limit pre-made sauces, mixes, and "instant" products such as flavored  rice, instant noodles, and ready-made pasta. Try more plant-protein sources, such as tofu, tempeh, black beans, edamame, lentils, nuts, and seeds. Explore eating plans such as the Mediterranean diet or vegetarian diet. Cooking Use oil to saut or stir-fry foods instead of solid fats such as butter, stick margarine, or lard. Try baking, boiling, grilling, or broiling instead of frying. Remove the fatty part of meats before cooking. Steam vegetables in water or broth. Meal planning  At meals, imagine dividing your plate into fourths: One-half of your plate is fruits and vegetables. One-fourth of your plate is whole grains. One-fourth of your plate is protein, especially lean meats, poultry, eggs, tofu, beans, or nuts. Include low-fat dairy as part of your daily diet. Lifestyle Choose healthy options in all settings, including home, work, school, restaurants, or stores. Prepare your food safely: Wash your hands after handling raw meats. Keep food preparation surfaces clean by regularly washing with hot, soapy water. Keep raw meats separate from ready-to-eat foods, such as fruits and vegetables. Cook seafood, meat, poultry, and eggs to the recommended internal temperature. Store foods at safe temperatures. In general: Keep cold foods at 71F (4.4C) or below. Keep hot foods at 171F (60C) or above. Keep your freezer at [facility] (-17.8C) or below. Foods are no longer safe to eat when they have been between the temperatures of 40-171F (4.4-60C) for more than 2 hours. What foods should I eat? Fruits Aim to eat 2 cup-equivalents of fresh, canned (in natural juice), or frozen fruits each day. Examples of 1 cup-equivalent of fruit include 1 small apple, 8 large strawberries, 1 cup canned fruit,  cup dried fruit, or 1 cup 100% juice. Vegetables Aim to eat 2-3 cup-equivalents  of fresh and frozen vegetables each day, including different varieties and colors. Examples of 1 cup-equivalent of  vegetables include 2 medium carrots, 2 cups raw, leafy greens, 1 cup chopped vegetable (raw or cooked), or 1 medium baked potato. Grains Aim to eat 6 ounce-equivalents of whole grains each day. Examples of 1 ounce-equivalent of grains include 1 slice of bread, 1 cup ready-to-eat cereal, 3 cups popcorn, or  cup cooked rice, pasta, or cereal. Meats and other proteins Aim to eat 5-6 ounce-equivalents of protein each day. Examples of 1 ounce-equivalent of protein include 1 egg, 1/2 cup nuts or seeds, or 1 tablespoon (16 g) peanut butter. A cut of meat or fish that is the size of a deck of cards is about 3-4 ounce-equivalents. Of the protein you eat each week, try to have at least 8 ounces come from seafood. This includes salmon, trout, herring, and anchovies. Dairy Aim to eat 3 cup-equivalents of fat-free or low-fat dairy each day. Examples of 1 cup-equivalent of dairy include 1 cup (240 mL) milk, 8 ounces (250 g) yogurt, 1 ounces (44 g) natural cheese, or 1 cup (240 mL) fortified soy milk. Fats and oils Aim for about 5 teaspoons (21 g) per day. Choose monounsaturated fats, such as canola and olive oils, avocados, peanut butter, and most nuts, or polyunsaturated fats, such as sunflower, corn, and soybean oils, walnuts, pine nuts, sesame seeds, sunflower seeds, and flaxseed. Beverages Aim for six 8-oz glasses of water per day. Limit coffee to three to five 8-oz cups per day. Limit caffeinated beverages that have added calories, such as soda and energy drinks. Limit alcohol intake to no more than 1 drink a day for nonpregnant women and 2 drinks a day for men. One drink equals 12 oz of beer (355 mL), 5 oz of wine (148 mL), or 1 oz of hard liquor (44 mL). Seasoning and other foods Avoid adding excess amounts of salt to your foods. Try flavoring foods with herbs and spices instead of salt. Avoid adding sugar to foods. Try using oil-based dressings, sauces, and spreads instead of solid fats. This  information is based on general [place] nutrition guidelines. For more information, visit [employer]. Exact amounts may vary based on your nutrition needs. Summary A healthy eating plan may help you to maintain a healthy weight, reduce the risk of chronic diseases, and stay active throughout your life. Plan your meals. Make sure you eat the right portions of a variety of nutrient-rich foods. Try baking, boiling, grilling, or broiling instead of frying. Choose healthy options in all settings, including home, work, school, restaurants, or stores. This information is not intended to replace advice given to you by your health care provider. Make sure you discuss any questions you have with your health care provider. Document Revised: 03/12/2022 Document Reviewed: 05/28/2021 Elsevier Patient Education  [employer].

## 2022-11-05 ENCOUNTER — Encounter: Payer: Self-pay | Admitting: Nurse Practitioner

## 2022-11-05 ENCOUNTER — Other Ambulatory Visit: Payer: Self-pay | Admitting: Nurse Practitioner

## 2022-11-05 DIAGNOSIS — E039 Hypothyroidism, unspecified: Secondary | ICD-10-CM

## 2022-11-05 DIAGNOSIS — R7989 Other specified abnormal findings of blood chemistry: Secondary | ICD-10-CM

## 2022-11-05 LAB — MICROALBUMIN / CREATININE URINE RATIO
Creatinine, Urine: 49 mg/dL (ref 20–275)
Microalb Creat Ratio: 8 mcg/mg creat (ref <30)
Microalb, Ur: 0.4 mg/dL

## 2022-11-05 LAB — CBC WITH DIFFERENTIAL/PLATELET
Absolute Monocytes: 604 cells/uL (ref 200–950)
Basophils Absolute: 51 cells/uL (ref 0–200)
Basophils Relative: 0.6 %
Eosinophils Absolute: 459 cells/uL (ref 15–500)
Eosinophils Relative: 5.4 %
HCT: 39.3 % (ref 35.0–45.0)
Hemoglobin: 13.7 g/dL (ref 11.7–15.5)
Lymphs Abs: 2482 cells/uL (ref 850–3900)
MCH: 31.2 pg (ref 27.0–33.0)
MCHC: 34.9 g/dL (ref 32.0–36.0)
MCV: 89.5 fL (ref 80.0–100.0)
MPV: 11.4 fL (ref 7.5–12.5)
Monocytes Relative: 7.1 %
Neutro Abs: 4905 cells/uL (ref 1500–7800)
Neutrophils Relative %: 57.7 %
Platelets: 253 10*3/uL (ref 140–400)
RBC: 4.39 10*6/uL (ref 3.80–5.10)
RDW: 12.9 % (ref 11.0–15.0)
Total Lymphocyte: 29.2 %
WBC: 8.5 10*3/uL (ref 3.8–10.8)

## 2022-11-05 LAB — EPSTEIN-BARR VIRUS VCA ANTIBODY PANEL
EBV NA IgG: 369 U/mL — ABNORMAL HIGH
EBV VCA IgG: 321 U/mL — ABNORMAL HIGH
EBV VCA IgM: <36 U/mL

## 2022-11-05 LAB — IRON,TIBC AND FERRITIN PANEL
%SAT: 24 % (calc) (ref 16–45)
Ferritin: 28 ng/mL (ref 16–232)
Iron: 89 ug/dL (ref 45–160)
TIBC: 377 mcg/dL (calc) (ref 250–450)

## 2022-11-05 LAB — COMPLETE METABOLIC PANEL WITH GFR
AG Ratio: 2.3 (calc) (ref 1.0–2.5)
ALT: 10 U/L (ref 6–29)
AST: 13 U/L (ref 10–35)
Albumin: 4.6 g/dL (ref 3.6–5.1)
Alkaline phosphatase (APISO): 77 U/L (ref 37–153)
BUN: 8 mg/dL (ref 7–25)
CO2: 23 mmol/L (ref 20–32)
Calcium: 9.7 mg/dL (ref 8.6–10.4)
Chloride: 101 mmol/L (ref 98–110)
Creat: 0.83 mg/dL (ref 0.50–1.03)
Globulin: 2 g/dL (calc) (ref 1.9–3.7)
Glucose, Bld: 87 mg/dL (ref 65–99)
Potassium: 3.7 mmol/L (ref 3.5–5.3)
Sodium: 135 mmol/L (ref 135–146)
Total Bilirubin: 0.4 mg/dL (ref 0.2–1.2)
Total Protein: 6.6 g/dL (ref 6.1–8.1)
eGFR: 82 mL/min/{1.73_m2} (ref >=60)

## 2022-11-05 LAB — INSULIN, RANDOM: Insulin: 32.1 u[IU]/mL — ABNORMAL HIGH

## 2022-11-05 LAB — LIPID PANEL
Cholesterol: 141 mg/dL (ref <200)
HDL: 45 mg/dL — ABNORMAL LOW (ref >=50)
LDL Cholesterol (Calc): 64 mg/dL (calc)
Non-HDL Cholesterol (Calc): 96 mg/dL (calc) (ref <130)
Total CHOL/HDL Ratio: 3.1 (calc) (ref <5.0)
Triglycerides: 303 mg/dL — ABNORMAL HIGH (ref <150)

## 2022-11-05 LAB — URINALYSIS, ROUTINE W REFLEX MICROSCOPIC
Bilirubin Urine: NEGATIVE
Glucose, UA: NEGATIVE
Hgb urine dipstick: NEGATIVE
Ketones, ur: NEGATIVE
Leukocytes,Ua: NEGATIVE
Nitrite: NEGATIVE
Protein, ur: NEGATIVE
Specific Gravity, Urine: 1.004 (ref 1.001–1.035)
pH: 6 (ref 5.0–8.0)

## 2022-11-05 LAB — HEMOGLOBIN A1C
Hgb A1c MFr Bld: 5.5 % of total Hgb (ref <5.7)
Mean Plasma Glucose: 111 mg/dL
eAG (mmol/L): 6.2 mmol/L

## 2022-11-05 LAB — MAGNESIUM: Magnesium: 1.8 mg/dL (ref 1.5–2.5)

## 2022-11-05 LAB — VITAMIN D 25 HYDROXY (VIT D DEFICIENCY, FRACTURES): Vit D, 25-Hydroxy: 30 ng/mL (ref 30–100)

## 2022-11-05 LAB — T4, FREE: Free T4: 1.1 ng/dL (ref 0.8–1.8)

## 2022-11-05 LAB — T3, FREE: T3, Free: 2.7 pg/mL (ref 2.3–4.2)

## 2022-11-05 LAB — TSH: TSH: 7.79 mIU/L — ABNORMAL HIGH (ref 0.40–4.50)

## 2022-11-05 MED ORDER — LEVOTHYROXINE SODIUM 25 MCG PO TABS: ORAL_TABLET | ORAL | 1 refills | Status: DC

## 2022-11-06 ENCOUNTER — Other Ambulatory Visit: Payer: Self-pay

## 2022-11-06 DIAGNOSIS — R7989 Other specified abnormal findings of blood chemistry: Secondary | ICD-10-CM

## 2022-11-06 MED ORDER — LEVOTHYROXINE SODIUM 88 MCG PO TABS: ORAL_TABLET | ORAL | 1 refills | Status: DC

## 2022-11-10 MED ORDER — LEVOTHYROXINE SODIUM 25 MCG PO TABS: ORAL_TABLET | ORAL | 1 refills | Status: DC

## 2022-11-12 ENCOUNTER — Telehealth: Payer: Self-pay | Admitting: Psychiatry

## 2022-11-12 NOTE — Telephone Encounter (Signed)
Pt called asking your advise. She had tried Wellbutrin to quit smoking. Had to stop due to side effects. PCP gave Rx for Chantix. Asking if she she should try? Advise pt @ [phone]

## 2022-11-12 NOTE — Telephone Encounter (Signed)
Please advise 

## 2022-11-13 NOTE — Telephone Encounter (Signed)
Pt informed

## 2022-11-13 NOTE — Telephone Encounter (Signed)
Chantix is very effective to help people stop smoking.  It is the most effective treatment.  It does have a risk of nightmares and nausea.  It carries a warning on the package that it made his call suicidal thoughts but that has largely been refuted.  Be sure to start with half of a tablet and gradually work up the dose to the normal dose of 1 twice a day.  But but I would encourage her to try it.  It is not like Wellbutrin so will not cause the same SE.  It is not an antidepressant.

## 2022-11-22 ENCOUNTER — Other Ambulatory Visit: Payer: Self-pay | Admitting: Psychiatry

## 2022-11-22 DIAGNOSIS — F4001 Agoraphobia with panic disorder: Secondary | ICD-10-CM

## 2022-11-22 DIAGNOSIS — F411 Generalized anxiety disorder: Secondary | ICD-10-CM

## 2022-11-22 NOTE — Telephone Encounter (Signed)
Please call to schedule an appt, due this month.

## 2022-12-03 ENCOUNTER — Inpatient Hospital Stay: Admission: RE | Admit: 2022-12-03 | Payer: Medicare Other | Source: Ambulatory Visit

## 2022-12-04 ENCOUNTER — Other Ambulatory Visit: Payer: Self-pay | Admitting: Psychiatry

## 2022-12-04 DIAGNOSIS — F4001 Agoraphobia with panic disorder: Secondary | ICD-10-CM

## 2022-12-04 DIAGNOSIS — F411 Generalized anxiety disorder: Secondary | ICD-10-CM

## 2022-12-04 DIAGNOSIS — F422 Mixed obsessional thoughts and acts: Secondary | ICD-10-CM

## 2022-12-05 NOTE — Telephone Encounter (Signed)
LVM to call and schedule appt

## 2022-12-16 ENCOUNTER — Inpatient Hospital Stay (HOSPITAL_BASED_OUTPATIENT_CLINIC_OR_DEPARTMENT_OTHER): Admission: RE | Admit: 2022-12-16 | Payer: Medicare Other | Source: Ambulatory Visit | Admitting: Radiology

## 2022-12-16 ENCOUNTER — Ambulatory Visit (HOSPITAL_BASED_OUTPATIENT_CLINIC_OR_DEPARTMENT_OTHER): Payer: Medicare Other | Admitting: Obstetrics & Gynecology

## 2023-01-19 ENCOUNTER — Other Ambulatory Visit: Payer: Self-pay | Admitting: Psychiatry

## 2023-01-19 DIAGNOSIS — F411 Generalized anxiety disorder: Secondary | ICD-10-CM

## 2023-01-19 DIAGNOSIS — F4001 Agoraphobia with panic disorder: Secondary | ICD-10-CM

## 2023-01-19 NOTE — Telephone Encounter (Signed)
Please call to schedule an appt  

## 2023-02-10 ENCOUNTER — Other Ambulatory Visit: Payer: Self-pay | Admitting: Psychiatry

## 2023-02-10 DIAGNOSIS — F422 Mixed obsessional thoughts and acts: Secondary | ICD-10-CM

## 2023-02-10 DIAGNOSIS — F4001 Agoraphobia with panic disorder: Secondary | ICD-10-CM

## 2023-02-10 DIAGNOSIS — F411 Generalized anxiety disorder: Secondary | ICD-10-CM

## 2023-02-26 ENCOUNTER — Telehealth (INDEPENDENT_AMBULATORY_CARE_PROVIDER_SITE_OTHER): Payer: Medicare Other | Admitting: Psychiatry

## 2023-02-26 ENCOUNTER — Encounter: Payer: Self-pay | Admitting: Psychiatry

## 2023-02-26 DIAGNOSIS — G2581 Restless legs syndrome: Secondary | ICD-10-CM

## 2023-02-26 DIAGNOSIS — F411 Generalized anxiety disorder: Secondary | ICD-10-CM

## 2023-02-26 DIAGNOSIS — F331 Major depressive disorder, recurrent, moderate: Secondary | ICD-10-CM

## 2023-02-26 DIAGNOSIS — F4001 Agoraphobia with panic disorder: Secondary | ICD-10-CM

## 2023-02-26 DIAGNOSIS — F5105 Insomnia due to other mental disorder: Secondary | ICD-10-CM

## 2023-02-26 DIAGNOSIS — R7989 Other specified abnormal findings of blood chemistry: Secondary | ICD-10-CM

## 2023-02-26 DIAGNOSIS — F422 Mixed obsessional thoughts and acts: Secondary | ICD-10-CM

## 2023-02-26 MED ORDER — CLONIDINE HCL 0.1 MG PO TABS: 0.1000 mg | ORAL_TABLET | Freq: Two times a day (BID) | ORAL | 0 refills | Status: DC

## 2023-02-26 MED ORDER — SERTRALINE HCL 100 MG PO TABS: ORAL_TABLET | ORAL | 3 refills | Status: DC
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
## 2023-03-05 ENCOUNTER — Other Ambulatory Visit (HOSPITAL_BASED_OUTPATIENT_CLINIC_OR_DEPARTMENT_OTHER): Payer: Self-pay | Admitting: Obstetrics & Gynecology

## 2023-03-19 ENCOUNTER — Other Ambulatory Visit: Payer: Medicare Other

## 2023-03-20 ENCOUNTER — Encounter: Payer: Self-pay | Admitting: Nurse Practitioner

## 2023-03-20 ENCOUNTER — Ambulatory Visit (INDEPENDENT_AMBULATORY_CARE_PROVIDER_SITE_OTHER): Payer: Medicare Other | Admitting: Nurse Practitioner

## 2023-03-20 VITALS — BP 118/74 | HR 85 | Temp 97.6°F | Ht 68.0 in | Wt 241.4 lb

## 2023-03-20 DIAGNOSIS — Z79899 Other long term (current) drug therapy: Secondary | ICD-10-CM

## 2023-03-20 DIAGNOSIS — E039 Hypothyroidism, unspecified: Secondary | ICD-10-CM

## 2023-03-20 DIAGNOSIS — R300 Dysuria: Secondary | ICD-10-CM | POA: Diagnosis not present

## 2023-03-20 DIAGNOSIS — E782 Mixed hyperlipidemia: Secondary | ICD-10-CM

## 2023-03-20 DIAGNOSIS — F325 Major depressive disorder, single episode, in full remission: Secondary | ICD-10-CM | POA: Diagnosis not present

## 2023-03-20 DIAGNOSIS — I700 Atherosclerosis of aorta: Secondary | ICD-10-CM | POA: Diagnosis not present

## 2023-03-20 DIAGNOSIS — F17200 Nicotine dependence, unspecified, uncomplicated: Secondary | ICD-10-CM | POA: Diagnosis not present

## 2023-03-20 DIAGNOSIS — R7309 Other abnormal glucose: Secondary | ICD-10-CM

## 2023-03-20 DIAGNOSIS — E559 Vitamin D deficiency, unspecified: Secondary | ICD-10-CM

## 2023-03-20 LAB — CBC WITH DIFFERENTIAL/PLATELET
Basophils Absolute: 87 cells/uL (ref 0–200)
Eosinophils Absolute: 644 cells/uL — ABNORMAL HIGH (ref 15–500)
Hemoglobin: 13.8 g/dL (ref 11.7–15.5)
Lymphs Abs: 2584 cells/uL (ref 850–3900)
MCHC: 32.9 g/dL (ref 32.0–36.0)
Monocytes Relative: 6.1 %
Neutro Abs: 4855 cells/uL (ref 1500–7800)
Neutrophils Relative %: 55.8 %
RBC: 4.56 10*6/uL (ref 3.80–5.10)
Total Lymphocyte: 29.7 %
WBC: 8.7 10*3/uL (ref 3.8–10.8)
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
## 2023-03-21 LAB — CBC WITH DIFFERENTIAL/PLATELET
Absolute Monocytes: 531 cells/uL (ref 200–950)
Basophils Relative: 1 %
Eosinophils Relative: 7.4 %
HCT: 42 % (ref 35.0–45.0)
MCH: 30.3 pg (ref 27.0–33.0)
MCV: 92.1 fL (ref 80.0–100.0)
MPV: 11.1 fL (ref 7.5–12.5)
Platelets: 270 10*3/uL (ref 140–400)
RDW: 13.1 % (ref 11.0–15.0)

## 2023-03-21 LAB — LIPID PANEL
Cholesterol: 149 mg/dL (ref <200)
HDL: 57 mg/dL (ref >=50)
LDL Cholesterol (Calc): 69 mg/dL (calc)
Non-HDL Cholesterol (Calc): 92 mg/dL (calc) (ref <130)
Total CHOL/HDL Ratio: 2.6 (calc) (ref <5.0)
Triglycerides: 148 mg/dL (ref <150)

## 2023-03-21 LAB — URINALYSIS, ROUTINE W REFLEX MICROSCOPIC
Bilirubin Urine: NEGATIVE
Glucose, UA: NEGATIVE
Hgb urine dipstick: NEGATIVE
Ketones, ur: NEGATIVE
Leukocytes,Ua: NEGATIVE
Nitrite: NEGATIVE
Protein, ur: NEGATIVE
Specific Gravity, Urine: 1.005 (ref 1.001–1.035)
pH: 5.5 (ref 5.0–8.0)

## 2023-03-21 LAB — HEMOGLOBIN A1C
Hgb A1c MFr Bld: 5.6 % of total Hgb (ref <5.7)
Mean Plasma Glucose: 114 mg/dL
eAG (mmol/L): 6.3 mmol/L

## 2023-03-21 LAB — COMPLETE METABOLIC PANEL WITH GFR
AG Ratio: 2 (calc) (ref 1.0–2.5)
ALT: 20 U/L (ref 6–29)
AST: 20 U/L (ref 10–35)
Albumin: 4.7 g/dL (ref 3.6–5.1)
Alkaline phosphatase (APISO): 96 U/L (ref 37–153)
BUN: 7 mg/dL (ref 7–25)
CO2: 29 mmol/L (ref 20–32)
Calcium: 9.9 mg/dL (ref 8.6–10.4)
Chloride: 102 mmol/L (ref 98–110)
Creat: 0.77 mg/dL (ref 0.50–1.03)
Globulin: 2.3 g/dL (calc) (ref 1.9–3.7)
Glucose, Bld: 89 mg/dL (ref 65–99)
Potassium: 4.6 mmol/L (ref 3.5–5.3)
Sodium: 138 mmol/L (ref 135–146)
Total Bilirubin: 0.3 mg/dL (ref 0.2–1.2)
Total Protein: 7 g/dL (ref 6.1–8.1)
eGFR: 89 mL/min/{1.73_m2} (ref >=60)

## 2023-03-21 LAB — URINE CULTURE
MICRO NUMBER:: 15056569
SPECIMEN QUALITY:: ADEQUATE

## 2023-03-21 LAB — TSH: TSH: 14.23 mIU/L — ABNORMAL HIGH (ref 0.40–4.50)

## 2023-03-22 ENCOUNTER — Encounter: Payer: Self-pay | Admitting: Nurse Practitioner

## 2023-03-22 DIAGNOSIS — R7989 Other specified abnormal findings of blood chemistry: Secondary | ICD-10-CM

## 2023-03-22 DIAGNOSIS — E039 Hypothyroidism, unspecified: Secondary | ICD-10-CM

## 2023-03-24 MED ORDER — LEVOTHYROXINE SODIUM 150 MCG PO TABS
150.00 ug | ORAL_TABLET | Freq: Every day | ORAL | 0 refills | Status: DC
Start: 2023-03-24 — End: 2023-04-20

## 2023-03-26 ENCOUNTER — Other Ambulatory Visit: Payer: Self-pay | Admitting: Nurse Practitioner

## 2023-03-26 ENCOUNTER — Telehealth: Payer: Self-pay | Admitting: Nurse Practitioner

## 2023-03-26 DIAGNOSIS — R7309 Other abnormal glucose: Secondary | ICD-10-CM

## 2023-03-26 DIAGNOSIS — E039 Hypothyroidism, unspecified: Secondary | ICD-10-CM

## 2023-03-26 DIAGNOSIS — E782 Mixed hyperlipidemia: Secondary | ICD-10-CM

## 2023-03-26 MED ORDER — WEGOVY 0.25 MG/0.5ML ~~LOC~~ SOAJ
0.2500 mg | SUBCUTANEOUS | 1 refills | Status: DC
Start: 2023-03-26 — End: 2023-04-15

## 2023-03-26 NOTE — Telephone Encounter (Signed)
Requesting status on prior auth for [facility]? She says she contacted her insurance company and they told her we havent submitted anything yet?

## 2023-03-26 NOTE — Patient Instructions (Signed)

## 2023-04-14 ENCOUNTER — Telehealth: Payer: Self-pay

## 2023-04-14 ENCOUNTER — Other Ambulatory Visit: Payer: Self-pay

## 2023-04-14 DIAGNOSIS — E782 Mixed hyperlipidemia: Secondary | ICD-10-CM

## 2023-04-14 MED ORDER — ROSUVASTATIN CALCIUM 20 MG PO TABS
ORAL_TABLET | ORAL | 3 refills | Status: DC
Start: 2023-04-14 — End: 2024-06-08

## 2023-04-14 NOTE — Telephone Encounter (Signed)
Inquiring about getting a prescription for Zepbound since her insurance denied the [facility]. She states that her insurance company will cover the Zepbound.  Also, wants to know if should wait until her next office visit on July 24th to have labs done or do it now. Please advise.

## 2023-04-15 ENCOUNTER — Other Ambulatory Visit: Payer: Self-pay | Admitting: Nurse Practitioner

## 2023-04-15 DIAGNOSIS — E782 Mixed hyperlipidemia: Secondary | ICD-10-CM

## 2023-04-15 MED ORDER — ZEPBOUND 2.5 MG/0.5ML ~~LOC~~ SOAJ
2.5000 mg | SUBCUTANEOUS | 2 refills | Status: DC
Start: 2023-04-15 — End: 2023-05-19

## 2023-04-19 ENCOUNTER — Other Ambulatory Visit: Payer: Self-pay | Admitting: Nurse Practitioner

## 2023-04-19 DIAGNOSIS — R7989 Other specified abnormal findings of blood chemistry: Secondary | ICD-10-CM

## 2023-04-19 DIAGNOSIS — E039 Hypothyroidism, unspecified: Secondary | ICD-10-CM

## 2023-04-23 ENCOUNTER — Ambulatory Visit (HOSPITAL_BASED_OUTPATIENT_CLINIC_OR_DEPARTMENT_OTHER): Payer: Medicare Other | Admitting: Obstetrics & Gynecology

## 2023-04-28 ENCOUNTER — Encounter (HOSPITAL_BASED_OUTPATIENT_CLINIC_OR_DEPARTMENT_OTHER): Payer: Self-pay | Admitting: Obstetrics & Gynecology

## 2023-04-28 ENCOUNTER — Ambulatory Visit (HOSPITAL_BASED_OUTPATIENT_CLINIC_OR_DEPARTMENT_OTHER)
Admission: RE | Admit: 2023-04-28 | Discharge: 2023-04-28 | Disposition: A | Discharge status: Home / Self Care | Payer: Medicare Other | Source: Ambulatory Visit | Attending: Obstetrics & Gynecology | Admitting: Obstetrics & Gynecology

## 2023-04-28 ENCOUNTER — Other Ambulatory Visit (HOSPITAL_COMMUNITY)
Admission: RE | Admit: 2023-04-28 | Discharge: 2023-04-28 | Disposition: A | Discharge status: Home / Self Care | Payer: Medicare Other | Source: Ambulatory Visit | Attending: Obstetrics & Gynecology | Admitting: Obstetrics & Gynecology

## 2023-04-28 ENCOUNTER — Ambulatory Visit (INDEPENDENT_AMBULATORY_CARE_PROVIDER_SITE_OTHER): Payer: Medicare Other | Admitting: Obstetrics & Gynecology

## 2023-04-28 VITALS — BP 137/75 | HR 80 | Wt 237.0 lb

## 2023-04-28 DIAGNOSIS — G2581 Restless legs syndrome: Secondary | ICD-10-CM

## 2023-04-28 DIAGNOSIS — Z01419 Encounter for gynecological examination (general) (routine) without abnormal findings: Secondary | ICD-10-CM | POA: Diagnosis not present

## 2023-04-28 DIAGNOSIS — Z90721 Acquired absence of ovaries, unilateral: Secondary | ICD-10-CM | POA: Diagnosis not present

## 2023-04-28 DIAGNOSIS — Z1231 Encounter for screening mammogram for malignant neoplasm of breast: Secondary | ICD-10-CM | POA: Insufficient documentation

## 2023-04-28 DIAGNOSIS — Z1151 Encounter for screening for human papillomavirus (HPV): Secondary | ICD-10-CM | POA: Insufficient documentation

## 2023-04-28 DIAGNOSIS — E039 Hypothyroidism, unspecified: Secondary | ICD-10-CM

## 2023-04-28 DIAGNOSIS — Z124 Encounter for screening for malignant neoplasm of cervix: Secondary | ICD-10-CM

## 2023-04-28 NOTE — Progress Notes (Signed)
57 y.o. G1P1 Single White or Caucasian female here for breast and pelvic exam.  Denies vaginal bleeding.    Frustrated with weight.  Has questions about integrative medicine providers in the area.  TSH is elevated and thyroid medication has been adjusted.  Now on levothyroxine daily.  TSH is still elevated.  She would like to see and endocrinologist at this time.    Requests magnesium testing today due to restless legs.  Patient's last menstrual period was 08/04/2012.          Sexually active: No.  The current method of family planning is post menopausal status.    Smoker:  yes  Health Maintenance: Pap:  10/17/2019 Negative MMG:  04/28/2023 Colonoscopy:  11/17/2014 BMD:   guidelines reviewed Screening Labs: does with PCP   reports that she has been smoking cigarettes. She started smoking about 34 years ago. She has a 34.5 pack-year smoking history. She has never used smokeless tobacco. She reports current alcohol use. She reports that she does not use drugs.  Past Medical History:  Diagnosis Date   Anal fissure 1994   Anxiety    Arthritis    Dr. Penni Bombard at [facility]   Depression    Endometriosis    Fracture 08/2011   fracture left wrist and elbow   Hemorrhoids 2008   Hepatomegaly    Hypothyroidism    Mixed hyperlipidemia    Obesity    OCD (obsessive compulsive disorder)    Panic disorder    Plantar fasciitis, right 03/2014   Prediabetes 01/16/2015   RLS (restless legs syndrome) 2014   vis sleep study- no sleep apnea   Shingles 07/2017   Tobacco abuse    not tolerated Chantix in the past   Vitamin D deficiency     Past Surgical History:  Procedure Laterality Date   CHOLECYSTECTOMY     HEMORROIDECTOMY     LEFT OOPHORECTOMY     TONSILLECTOMY      Current Outpatient Medications  Medication Sig Dispense Refill   albuterol (VENTOLIN HFA) 108 (90 Base) MCG/ACT inhaler INHALE 2 PUFFS BY MOUTH EVERY 6 HOURS AS NEEDED FOR WHEEZING OR SHORTNESS OF  BREATH 18 g 2   Cholecalciferol (VITAMIN D3) 1.25 MG (50000 UT) CAPS Take 1 capsule by mouth once a week 8 capsule 0   clonazePAM (KLONOPIN) 1 MG tablet TAKE 1/2 TABLET TWICE DAILY AND 1 TABLET AT NIGHT AS NEEDED FOR ANXIETY 60 tablet 3   cloNIDine (CATAPRES) 0.1 MG tablet Take 1 tablet (0.1 mg total) by mouth 2 (two) times daily. 60 tablet 0   clotrimazole-betamethasone (LOTRISONE) cream Apply 1 application. topically 2 (two) times daily. 15 g 2   gabapentin (NEURONTIN) 100 MG capsule TAKE 1 TO 3 CAPSULES BY MOUTH AS NEEDED FOR  RESTLESS  LEGS 90 capsule 0   levothyroxine (SYNTHROID) 150 MCG tablet Take 1 tablet by mouth once daily 30 tablet 0   mometasone (NASONEX) 50 MCG/ACT nasal spray Place 2 sprays into the nose daily.     Phenylephrine-APAP-guaiFENesin (TYLENOL SINUS SEVERE PO) Take by mouth.     rosuvastatin (CRESTOR) 20 MG tablet Take 1 tab daily in the evening for cholesterol to help reduce heart attack/stroke risk. 90 tablet 3   sertraline (ZOLOFT) 100 MG tablet TAKE 2 & 1/2 (TWO & ONE-HALF) TABLETS BY MOUTH ONCE DAILY 75 tablet 3   sodium chloride (OCEAN) 0.65 % SOLN nasal spray Place 1 spray into both nostrils as needed for congestion.  tirzepatide (ZEPBOUND) 2.5 MG/0.5ML Pen Inject 2.5 mg into the skin once a week. 0.5 mL 2   triamcinolone (NASACORT) 55 MCG/ACT AERO nasal inhaler Place 2 sprays into the nose daily.     varenicline (CHANTIX) 0.5 MG tablet Take 1 tablet (0.5 mg total) by mouth 2 (two) times daily. 30 tablet 0   No current facility-administered medications for this visit.    Family History  Problem Relation Age of Onset   Breast cancer Mother    Diabetes Mother    Drug abuse Brother        and alcohol   Cirrhosis Father        alcohol   Heart disease Father        MI   Colon cancer Maternal Grandmother 69   Alcohol abuse Sister    Breast cancer Cousin    Thyroid disease Neg Hx     ROS: Constitutional: negative Genitourinary:negative  Exam:   BP  137/75 (BP Location: Left Arm, Patient Position: Sitting, Cuff Size: Large)   Pulse 80   Wt 237 lb (107.5 kg)   LMP 08/04/2012   BMI 36.04 kg/m      General appearance: alert, cooperative and appears stated age Head: Normocephalic, without obvious abnormality, atraumatic Neck: no adenopathy, supple, symmetrical, trachea midline and thyroid normal to inspection and palpation Lungs: clear to auscultation bilaterally Breasts: normal appearance, no masses or tenderness Heart: regular rate and rhythm Abdomen: soft, non-tender; bowel sounds normal; no masses,  no organomegaly Extremities: extremities normal, atraumatic, no cyanosis or edema Skin: Skin color, texture, turgor normal. No rashes or lesions Lymph nodes: Cervical, supraclavicular, and axillary nodes normal. No abnormal inguinal nodes palpated Neurologic: Grossly normal   Pelvic: External genitalia:  no lesions              Urethra:  normal appearing urethra with no masses, tenderness or lesions              Bartholins and Skenes: normal                 Vagina: normal appearing vagina with normal color and no discharge, no lesions              Cervix: no lesions              Pap taken: Yes.   Bimanual Exam:  Uterus:  normal size, contour, position, consistency, mobility, non-tender              Adnexa: normal adnexa and no mass, fullness, tenderness               Rectovaginal: Confirms               Anus:  normal sphincter tone, no lesions  Chaperone, Ina Homes, CMA, was present for exam.  Assessment/Plan: 1. Encntr for gyn exam (general) (routine) w/o abn findings - Pap smear obtained today - Mammogram 04/2023 - Colonoscopy 11/17/2014, follow up 10 years - Bone mineral density not indicated yet - screening lab work done with PCP - vaccines reviewed/updated  2. Cervical cancer screening - Cytology - PAP( [facility])  3. Restless leg - Magnesium  4. Acquired hypothyroidism - Ambulatory referral to  Endocrinology  5. History of oophorectomy, unilateral

## 2023-04-29 LAB — MAGNESIUM: Magnesium: 1.9 mg/dL (ref 1.6–2.3)

## 2023-04-30 LAB — CYTOLOGY - PAP
Comment: NEGATIVE
Comment: NEGATIVE
Diagnosis: NEGATIVE
High risk HPV: NEGATIVE
Trichomonas: NEGATIVE

## 2023-05-03 ENCOUNTER — Other Ambulatory Visit (HOSPITAL_BASED_OUTPATIENT_CLINIC_OR_DEPARTMENT_OTHER): Payer: Self-pay | Admitting: Obstetrics & Gynecology

## 2023-05-06 ENCOUNTER — Ambulatory Visit: Payer: Medicare Other | Admitting: Nurse Practitioner

## 2023-05-08 ENCOUNTER — Telehealth: Payer: Self-pay

## 2023-05-08 NOTE — Telephone Encounter (Signed)
Patient wants to start semaglutide and wants to know if it is okay with her MH meds.  Clonazepaem Clonidine Gabapentin Zoloft

## 2023-05-12 NOTE — Telephone Encounter (Signed)
LVM with info per DPR.  

## 2023-05-17 ENCOUNTER — Other Ambulatory Visit: Payer: Self-pay | Admitting: Nurse Practitioner

## 2023-05-17 DIAGNOSIS — E039 Hypothyroidism, unspecified: Secondary | ICD-10-CM

## 2023-05-17 DIAGNOSIS — R7989 Other specified abnormal findings of blood chemistry: Secondary | ICD-10-CM

## 2023-05-19 ENCOUNTER — Ambulatory Visit: Payer: Medicare Other | Admitting: Nurse Practitioner

## 2023-05-19 ENCOUNTER — Encounter: Payer: Self-pay | Admitting: Nurse Practitioner

## 2023-05-19 VITALS — BP 110/70 | HR 94 | Temp 97.7°F | Ht 68.0 in | Wt 233.0 lb

## 2023-05-19 DIAGNOSIS — R7309 Other abnormal glucose: Secondary | ICD-10-CM

## 2023-05-19 DIAGNOSIS — Z0001 Encounter for general adult medical examination with abnormal findings: Secondary | ICD-10-CM

## 2023-05-19 DIAGNOSIS — E782 Mixed hyperlipidemia: Secondary | ICD-10-CM

## 2023-05-19 DIAGNOSIS — R0602 Shortness of breath: Secondary | ICD-10-CM

## 2023-05-19 DIAGNOSIS — Z79899 Other long term (current) drug therapy: Secondary | ICD-10-CM | POA: Diagnosis not present

## 2023-05-19 DIAGNOSIS — R5383 Other fatigue: Secondary | ICD-10-CM | POA: Diagnosis not present

## 2023-05-19 DIAGNOSIS — Z8249 Family history of ischemic heart disease and other diseases of the circulatory system: Secondary | ICD-10-CM | POA: Diagnosis not present

## 2023-05-19 DIAGNOSIS — Z720 Tobacco use: Secondary | ICD-10-CM | POA: Diagnosis not present

## 2023-05-19 DIAGNOSIS — S0300XD Dislocation of jaw, unspecified side, subsequent encounter: Secondary | ICD-10-CM

## 2023-05-19 DIAGNOSIS — R160 Hepatomegaly, not elsewhere classified: Secondary | ICD-10-CM

## 2023-05-19 DIAGNOSIS — E66812 Obesity, class 2: Secondary | ICD-10-CM

## 2023-05-19 DIAGNOSIS — E039 Hypothyroidism, unspecified: Secondary | ICD-10-CM

## 2023-05-19 DIAGNOSIS — F325 Major depressive disorder, single episode, in full remission: Secondary | ICD-10-CM | POA: Diagnosis not present

## 2023-05-19 DIAGNOSIS — R6889 Other general symptoms and signs: Secondary | ICD-10-CM | POA: Diagnosis not present

## 2023-05-19 DIAGNOSIS — Z0000 Encounter for general adult medical examination without abnormal findings: Secondary | ICD-10-CM

## 2023-05-19 DIAGNOSIS — I700 Atherosclerosis of aorta: Secondary | ICD-10-CM

## 2023-05-19 DIAGNOSIS — E559 Vitamin D deficiency, unspecified: Secondary | ICD-10-CM

## 2023-05-19 DIAGNOSIS — R0609 Other forms of dyspnea: Secondary | ICD-10-CM

## 2023-05-19 MED ORDER — WEGOVY 0.25 MG/0.5ML ~~LOC~~ SOAJ: 0.2500 mg | SUBCUTANEOUS | 2 refills | Status: DC

## 2023-05-19 NOTE — Progress Notes (Signed)
ANNUAL MEDICARE VISIT AND FOLLOW UP  Assessment:   Annual Medicare Wellness Visit Due annually  Health maintenance reviewed Healthily lifestyle goals set  Hypothyroidism, unspecified hypothyroidism type Monitor TSH - elevated 03/20/23 Reminded to take on an empty stomach 30-47mins before food.  Stop any Biotin Supplement 48-72 hours before next TSH level to reduce the risk of falsely low TSH levels. Continue to monitor.    Mixed hyperlipidemia Discussed lifestyle modifications. Recommended diet heavy in fruits and veggies, omega 3's. Decrease consumption of animal meats, cheeses, and dairy products. Remain active and exercise as tolerated. Continue to monitor. 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 Jennelle Human continue meds, Requesting therapist - reference list provided.  Vitamin D deficiency Continue supplement Monitor and check levels  Tobacco use disorder Follows with Pulmonology, Dr. Everardo All Smoking cessation-  instruction/counseling given, counseled patient on the dangers of tobacco use, advised patient to stop smoking, and reviewed strategies to maximize success, patient not ready to quit at this time but discussed reducing use.    Other abnormal glucose Education: Reviewed 'ABCs' of diabetes management  Discussed goals to be met and/or maintained include A1C (<7) Blood pressure (<130/80) Cholesterol (LDL <70) Continue Eye Exam yearly  Continue Dental Exam Q6 mo Discussed dietary recommendations Discussed Physical Activity recommendations Check A1C  TMJ Improved; monitor   Morbid obesity (HCC) - BMI 35 with hld, htn Trending down - most likely due to TSH level WNL. Will re-submit [id number] - possible appeal discussed. Discussed appropriate BMI Diet modification. Physical activity. Encouraged/praised to build confidence.   Hepatomegaly Weight loss encouraged, monitor LFTs  Family hx of  cardiovascular disease Has had normal EKGs, non-exertional though does have increased dyspnea  Low risk myoview 02/2010 Recent negative serial troponins during episode, felt atypical  However high risk, smoker, poor lifestyle, hld, family hx Patient very anxious; discussed CT coronary calcium score with low threshold for referral back to cardiology Also emphasized risk reduction by lifestyle change and med Will control LDL <70 Reduce tobacco, caffeine;  Go to the ER if any chest pain, shortness of breath, nausea, dizziness, severe HA, changes vision/speech  Aortic arch atherosclerosis Continue to keep BG well controlled Continue lifestyle modifications.  Monitor lipids  Medication management All medications discussed and reviewed in full. All questions and concerns regarding medications addressed.    Other fatigue/SOB/DOE Obtain updated CXR to assess for any underlying etiology given smoking hx. Discussed importance of following with Pulmonology Report to ER for any increase in difficulty breathing.   Orders Placed This Encounter  Procedures   DG Chest 2 View    Standing Status:   Future    Standing Expiration Date:   05/18/2024    Order Specific Question:   Reason for Exam (SYMPTOM  OR DIAGNOSIS REQUIRED)    Answer:   DOE, SOB, tobacco abuse    Order Specific Question:   Is patient pregnant?    Answer:   No    Order Specific Question:   Preferred imaging location?    Answer:   [id number]   TSH   Notify office for further evaluation and treatment, questions or concerns if any reported s/s fail to improve.   The patient was advised to call back or seek an in-person evaluation if any symptoms worsen or if the condition fails to improve as anticipated.   Further disposition pending results of labs. Discussed med's effects and SE's.    I discussed the assessment and  treatment plan with the patient. The patient was provided an opportunity to ask questions and all were  answered. The patient agreed with the plan and demonstrated an understanding of the instructions.  Discussed med's effects and SE's. Screening labs and tests as requested with regular follow-up as recommended.  I provided 40 minutes of face-to-face time during this encounter including counseling, chart review, and critical decision making was preformed.   Future Appointments  Date Time Provider Department Center  11/05/2023  2:00 PM [place], Archie Patten, NP [facility] None  05/19/2024  2:00 PM Adela Glimpse, NP [facility] None     Plan:   During the course of the visit the patient was educated and counseled about appropriate screening and preventive services including:   Pneumococcal vaccine  Prevnar 13 Influenza vaccine Td vaccine Screening electrocardiogram Bone densitometry screening Colorectal cancer screening Diabetes screening Glaucoma screening Nutrition counseling  Advanced directives: requested    Subjective:  Susan Bartlett is a 57 y.o. presents for follow up and AWV. 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 She suffered from very severe depression (niece passed away) and was inactive for two years. She recently became more ago and has noticed she has been short of breath with activity associated with wheezing. She now follows with Dr. Jennelle Human who prescribes meds, has been working to increase activity by getting out and walk more. She is starting to sort through family household items, including her own.  This  has helped some but she continues to feel overwhelmed.  She does not increase in fatigue and SOB with DOE.  She continues to be a current every day smoker.   Smoking 1.5 packs/day, since she was 57 years old, no mass or nodule was noted on CTA in 05/09/2021, Plans to continue low dose screening.  Follows Pulmonology.  Uses Albuterol only.  She does not use a maintenance inhaler.   She was reporting episodes of palpitations, had normal labs, EKG on 02/20/2021. She presented to ED 05/09/2021 with chest pain, felt atypical, thought to be NSAID related, no longer ntoiced.  She had negative serial troponins and EKG, had CTA unremarkable other than aortic atherosclerosis and central bronchia thickening. She was Dr. Eden Emms with low risk myoview in 06-03-2010. She remains very anxious about her cardiac risk. Significant family hx of CVD.   She has lumbar back pain, has seen [facility]. Penni Bombard at [facility] orth[facility], no surgery was advised. Will follow up for flares, resolved with NSAID taper.   BMI is Body mass index is 35.43 kg/m. Weight is continuing to trend down.   Wt Readings from Last 3 Encounters:  05/19/23 233 lb (105.7 kg)  04/28/23 237 lb (107.5 kg)  03/20/23 241 lb 6.4 oz (109.5 kg)   Today their BP is BP: 110/70. She denies dyspnea, dizziness. Has had chest pain episodes.   She is on cholesterol medication, pravastatin 40 mg daily and denies myalgias. Her cholesterol is not at goal. The cholesterol last visit was:   Lab Results  Component Value Date   CHOL 149 03/20/2023   HDL 57 03/20/2023   LDLCALC 69 03/20/2023  TRIG 148 03/20/2023   CHOLHDL 2.6 03/20/2023   Last W0J in the office was:  Lab Results  Component Value Date   HGBA1C 5.6 03/20/2023   She has hx of vit D def. Patient was off of vitamin D last check, back to taking 81191 IU 1 once weekly.  Lab Results  Component Value Date   VD25OH 30 11/04/2022   She is on thyroid medication, with modification last blood work as TSH was elevated.   Lab Results  Component Value Date   TSH 14.23 (H) 03/20/2023     Current Outpatient Medications  Medication Instructions   albuterol (VENTOLIN HFA) 108 (90 Base) MCG/ACT inhaler INHALE 2 PUFFS BY MOUTH EVERY 6 HOURS AS NEEDED FOR WHEEZING OR SHORTNESS OF BREATH   clonazePAM (KLONOPIN) 1 MG tablet TAKE 1/2 TABLET TWICE DAILY AND 1 TABLET AT NIGHT AS NEEDED FOR ANXIETY   cloNIDine (CATAPRES) 0.1 mg, Oral, 2 times daily   clotrimazole-betamethasone (LOTRISONE) cream 1 application , Topical, 2 times daily   gabapentin (NEURONTIN) 100 MG capsule TAKE 1 TO 3 CAPSULES BY MOUTH AS NEEDED FOR  RESTLESS  LEGS   levothyroxine (SYNTHROID) 150 mcg, Oral, Daily   mometasone (NASONEX) 50 MCG/ACT nasal spray 2 sprays, Nasal, Daily   Omega-3 Fatty Acids (FISH OIL) 1200 MG CAPS Oral, Daily   Phenylephrine-APAP-guaiFENesin (TYLENOL SINUS SEVERE PO) Take by mouth.   rosuvastatin (CRESTOR) 20 MG tablet Take 1 tab daily in the evening for cholesterol to help reduce heart attack/stroke risk.   sertraline (ZOLOFT) 100 MG tablet TAKE 2 & 1/2 (TWO & ONE-HALF) TABLETS BY MOUTH ONCE DAILY   sodium chloride (OCEAN) 0.65 % SOLN nasal spray 1 spray, Each Nare, As needed   triamcinolone (NASACORT) 55 MCG/ACT AERO nasal inhaler 2 sprays, Nasal, Daily   varenicline (CHANTIX) 0.5 mg, Oral, 2 times daily   Vitamin D3 1.25 mg, Oral, Weekly   Zepbound 2.5 mg, Subcutaneous, Weekly     Current Problems (verified) Patient Active Problem List   Diagnosis Date Noted   Muscular deconditioning 04/24/2022   Dyspnea on minimal exertion 10/31/2021   B12 deficiency 09/12/2021   Aortic arch atherosclerosis (HCC) - per CTA 04/2021 09/12/2021   Abnormal glucose 11/24/2018   Labile hypertension 03/05/2018   Tobacco abuse 01/16/2015   TMJ (dislocation of temporomandibular joint) 01/16/2015   Vitamin D deficiency    Hepatomegaly    Mixed hyperlipidemia    Panic disorder    Anal fissure    Depression, major, in remission (HCC)     Hypothyroidism    Morbid obesity (HCC) - BMI 35+ with htn, hld     Immunization History  Administered Date(s) Administered   Influenza Split 08/01/2013   Td 11/18/2012   Preventative care: Tetanus: 11/2012 Pneumovax: declines Pneum 20: declines Flu vaccines declines Shingrix: will consider  Covid 19: declines   Pap:  10/2019, Dr. Hyacinth Meeker MGM: 04/2023 DEXA: 2008, heel neg  Colonoscopy: 11/2014 due 10 years EGD: N/A  PFT: 01/2013 Stress test: 02/2010 Eden Emms)  CTA chest: 05/09/2021, no nodules, aortic atherosclerosis   Names of Other Physician/Practitioners you currently use: 1. [facility] here for primary care 2.  [facility], eye doctor, last visit 1999 3. Dr. Lequita Asal , dentist, last 2023, goes q37m   Patient Care Team: Lucky Cowboy, MD as PCP - General (Internal Medicine) Wendall Stade, MD as PCP - Cardiology (Cardiology) Wendall Stade, MD as Consulting Physician (Cardiology) Iva Boop, MD as  Consulting Physician (Gastroenterology) Jerene Bears, MD as Consulting Physician (Gynecology) Cottle, Steva Ready., MD as Attending Physician (Psychiatry)    Allergies No Known Allergies  SURGICAL HISTORY She  has a past surgical history that includes Hemorroidectomy; Cholecystectomy; Left oophorectomy; and Tonsillectomy. FAMILY HISTORY Her family history includes Alcohol abuse in her sister; Breast cancer in her cousin and mother; Cirrhosis in her father; Colon cancer (age of onset: 52) in her maternal grandmother; Diabetes in her mother; Drug abuse in her brother; Heart disease in her father. SOCIAL HISTORY She  reports that she has been smoking cigarettes. She started smoking about 34 years ago. She has a 34.6 pack-year smoking history. She has never used smokeless tobacco. She reports current alcohol use. She reports that she does not use drugs.  MEDICARE WELLNESS OBJECTIVES: Physical activity:   Cardiac  risk factors:   Depression/mood screen:      04/28/2023    2:48 PM  Depression screen PHQ 2/9  Decreased Interest 0  Down, Depressed, Hopeless 0  PHQ - 2 Score 0    ADLs:     07/08/2022    9:42 PM  In your present state of health, do you have any difficulty performing the following activities:  Hearing? 0  Vision? 0  Difficulty concentrating or making decisions? 0  Walking or climbing stairs? 0  Dressing or bathing? 0  Doing errands, shopping? 0  Preparing Food and eating ? N  Using the Toilet? N  In the past six months, have you accidently leaked urine? N  Do you have problems with loss of bowel control? N  Managing your Medications? N  Managing your Finances? N  Housekeeping or managing your Housekeeping? Y  Comment chronic fatigue     Cognitive Testing  Alert? Yes  Normal Appearance?Yes  Oriented to person? Yes  Place? Yes   Time? Yes  Recall of three objects?  Yes  Can perform simple calculations? Yes  Displays appropriate judgment?Yes  Can read the correct time from a watch face?Yes  EOL planning:       Review of Systems  Constitutional:  Negative for chills, fever, malaise/fatigue and weight loss.  HENT:  Negative for congestion, ear pain, hearing loss, sore throat and tinnitus.   Eyes:  Negative for blurred vision and double vision.  Respiratory:  Positive for shortness of breath. Negative for cough, sputum production and wheezing.   Cardiovascular:  Positive for palpitations. Negative for chest pain, orthopnea, claudication and leg swelling.  Gastrointestinal:  Negative for abdominal pain, blood in stool, constipation, diarrhea, heartburn, melena, nausea and vomiting.  Genitourinary: Negative.   Musculoskeletal:  Positive for falls. Negative for joint pain and myalgias.  Skin:  Negative for rash.  Neurological:  Negative for dizziness, tingling, sensory change, weakness and headaches.  Endo/Heme/Allergies:  Negative for polydipsia.   Psychiatric/Behavioral:  Positive for depression. Negative for hallucinations, substance abuse and suicidal ideas. The patient is nervous/anxious. The patient does not have insomnia.   All other systems reviewed and are negative.   Objective:     Blood pressure 110/70, pulse 94, temperature 97.7 F (36.5 C), height 5\' 8"  (1.727 m), weight 233 lb (105.7 kg), last menstrual period 08/04/2012, SpO2 98%. Body mass index is 35.43 kg/m.  General appearance: alert, no distress, WD/WN, female HEENT: normocephalic, sclerae anicteric, TMs pearly, nares patent, no discharge or erythema, pharynx normal Oral cavity: MMM, no lesions Neck: supple, no lymphadenopathy, no thyromegaly, no masses Heart: RRR, normal S1, S2, no murmurs Lungs:  CTA bilaterally, no wheezes, rhonchi, or rales Abdomen: +bs, soft, non tender, non distended, no masses, no hepatomegaly, no splenomegaly Musculoskeletal: nontender, no swelling, no obvious deformity Extremities: no edema, no cyanosis, no clubbing Pulses: 2+ symmetric, upper and lower extremities, normal cap refill Neurological: alert, oriented x 3, CN2-12 intact, strength normal upper extremities and lower extremities, sensation normal throughout, normal gait Psychiatric: mildly anxious affect, behavior normal, pleasant     Medicare Attestation I have personally reviewed: The patient's medical and social history Their use of alcohol, tobacco or illicit drugs Their current medications and supplements The patient's functional ability including ADLs,fall risks, home safety risks, cognitive, and hearing and visual impairment Diet and physical activities Evidence for depression or mood disorders  The patient's weight, height, BMI, and visual acuity have been recorded in the chart.  I have made referrals, counseling, and provided education to the patient based on review of the above and I have provided the patient with a written personalized care plan for preventive  services.     Adela Glimpse, NP   05/19/2023

## 2023-05-24 NOTE — Patient Instructions (Signed)
Shortness of Breath, Adult Shortness of breath means you have trouble breathing. Shortness of breath could be a sign of a medical problem. Follow these instructions at home:  Pollution Do not smoke or use any products that contain nicotine or tobacco. If you need help quitting, ask your doctor. Avoid things that can make it harder to breathe, such as: Smoke of all kinds. This includes smoke from campfires or forest fires. Do not smoke or allow others to smoke in your home. Mold. Dust. Air pollution. Chemical smells. Things that can give you an allergic reaction (allergens) if you have allergies. Keep your living space clean. Use products that help remove mold and dust. General instructions Watch for any changes in your symptoms. Take over-the-counter and prescription medicines only as told by your doctor. This includes oxygen therapy and inhaled medicines. Rest as needed. Return to your normal activities when your doctor says that it is safe. Keep all follow-up visits. Contact a doctor if: Your condition does not get better as soon as expected. You have a hard time doing your normal activities, even after you rest. You have new symptoms. You cannot walk up stairs. You cannot exercise the way you normally do. Get help right away if: Your shortness of breath gets worse. You have trouble breathing when you are resting. You feel light-headed or you faint. You have a cough that is not helped by medicines. You cough up blood. You have pain with breathing. You have pain in your chest, arms, shoulders, or belly (abdomen). You have a fever. These symptoms may be an emergency. Get help right away. Call 911. Do not wait to see if the symptoms will go away. Do not drive yourself to the hospital. Summary Shortness of breath is when you have trouble breathing enough air. It can be a sign of a medical problem. Avoid things that make it hard for you to breathe, such as smoking, pollution,  mold, and dust. Watch for any changes in your symptoms. Contact your doctor if you do not get better or you get worse. This information is not intended to replace advice given to you by your health care provider. Make sure you discuss any questions you have with your health care provider. Document Revised: 05/18/2021 Document Reviewed: 05/18/2021 Elsevier Patient Education  2024 [employer] Inc.  

## 2023-06-08 DIAGNOSIS — Z131 Encounter for screening for diabetes mellitus: Secondary | ICD-10-CM | POA: Diagnosis not present

## 2023-06-08 DIAGNOSIS — R7989 Other specified abnormal findings of blood chemistry: Secondary | ICD-10-CM | POA: Diagnosis not present

## 2023-06-08 DIAGNOSIS — E039 Hypothyroidism, unspecified: Secondary | ICD-10-CM | POA: Diagnosis not present

## 2023-06-08 DIAGNOSIS — E7849 Other hyperlipidemia: Secondary | ICD-10-CM | POA: Diagnosis not present

## 2023-06-08 DIAGNOSIS — R5383 Other fatigue: Secondary | ICD-10-CM | POA: Diagnosis not present

## 2023-06-09 ENCOUNTER — Telehealth: Payer: Self-pay | Admitting: Psychiatry

## 2023-06-09 DIAGNOSIS — F411 Generalized anxiety disorder: Secondary | ICD-10-CM

## 2023-06-09 DIAGNOSIS — F4001 Agoraphobia with panic disorder: Secondary | ICD-10-CM

## 2023-06-10 DIAGNOSIS — R519 Headache, unspecified: Secondary | ICD-10-CM | POA: Diagnosis not present

## 2023-06-10 DIAGNOSIS — E039 Hypothyroidism, unspecified: Secondary | ICD-10-CM | POA: Diagnosis not present

## 2023-06-10 DIAGNOSIS — R5383 Other fatigue: Secondary | ICD-10-CM | POA: Diagnosis not present

## 2023-06-10 DIAGNOSIS — R768 Other specified abnormal immunological findings in serum: Secondary | ICD-10-CM | POA: Diagnosis not present

## 2023-06-11 ENCOUNTER — Encounter: Payer: Self-pay | Admitting: Nurse Practitioner

## 2023-06-12 NOTE — Telephone Encounter (Signed)
Pt called at 1:53. She said she can only take the [employer] brand Clonazepam.  They [facility]'t [facility] [address] but they [facility] at [facility] [address]  They said they would cancel it at [facility] but that she needed to call us and request to have it sent to [address].  No upcoming appt scheduled.

## 2023-06-16 ENCOUNTER — Telehealth: Payer: Self-pay | Admitting: Psychiatry

## 2023-06-16 ENCOUNTER — Other Ambulatory Visit: Payer: Self-pay

## 2023-06-16 DIAGNOSIS — E7849 Other hyperlipidemia: Secondary | ICD-10-CM | POA: Diagnosis not present

## 2023-06-16 DIAGNOSIS — F411 Generalized anxiety disorder: Secondary | ICD-10-CM

## 2023-06-16 DIAGNOSIS — F4001 Agoraphobia with panic disorder: Secondary | ICD-10-CM

## 2023-06-16 MED ORDER — CLONAZEPAM 1 MG PO TABS
ORAL_TABLET | ORAL | 1 refills | Status: DC
Start: 2023-06-16 — End: 2023-07-22

## 2023-06-16 NOTE — Telephone Encounter (Signed)
Pended.

## 2023-06-16 NOTE — Telephone Encounter (Signed)
Susan Bartlett called at 11:35 to also ask if it would be ok for her to take Liothronine 5 mcg for her Thyroid.  She wants to make sure there is no interaction.  Please advise

## 2023-06-16 NOTE — Telephone Encounter (Signed)
Betanya called at 11:35 to check on status of sending this prescription to the [facility] on [address]  Made appt for 10/9

## 2023-06-22 DIAGNOSIS — R7989 Other specified abnormal findings of blood chemistry: Secondary | ICD-10-CM | POA: Diagnosis not present

## 2023-06-27 ENCOUNTER — Other Ambulatory Visit: Payer: Self-pay | Admitting: Nurse Practitioner

## 2023-06-27 DIAGNOSIS — R7989 Other specified abnormal findings of blood chemistry: Secondary | ICD-10-CM

## 2023-06-27 DIAGNOSIS — E039 Hypothyroidism, unspecified: Secondary | ICD-10-CM

## 2023-07-01 DIAGNOSIS — E039 Hypothyroidism, unspecified: Secondary | ICD-10-CM | POA: Diagnosis not present

## 2023-07-08 DIAGNOSIS — E039 Hypothyroidism, unspecified: Secondary | ICD-10-CM | POA: Diagnosis not present

## 2023-07-08 DIAGNOSIS — R7989 Other specified abnormal findings of blood chemistry: Secondary | ICD-10-CM | POA: Diagnosis not present

## 2023-07-11 ENCOUNTER — Other Ambulatory Visit: Payer: Self-pay | Admitting: Psychiatry

## 2023-07-11 DIAGNOSIS — F4001 Agoraphobia with panic disorder: Secondary | ICD-10-CM

## 2023-07-11 DIAGNOSIS — F422 Mixed obsessional thoughts and acts: Secondary | ICD-10-CM

## 2023-07-11 DIAGNOSIS — F411 Generalized anxiety disorder: Secondary | ICD-10-CM

## 2023-07-22 ENCOUNTER — Ambulatory Visit: Payer: Medicare Other | Admitting: Psychiatry

## 2023-07-22 ENCOUNTER — Encounter: Payer: Self-pay | Admitting: Psychiatry

## 2023-07-22 DIAGNOSIS — F331 Major depressive disorder, recurrent, moderate: Secondary | ICD-10-CM | POA: Diagnosis not present

## 2023-07-22 DIAGNOSIS — F411 Generalized anxiety disorder: Secondary | ICD-10-CM | POA: Diagnosis not present

## 2023-07-22 DIAGNOSIS — F422 Mixed obsessional thoughts and acts: Secondary | ICD-10-CM | POA: Diagnosis not present

## 2023-07-22 DIAGNOSIS — G2581 Restless legs syndrome: Secondary | ICD-10-CM

## 2023-07-22 DIAGNOSIS — F5105 Insomnia due to other mental disorder: Secondary | ICD-10-CM

## 2023-07-22 DIAGNOSIS — F4001 Agoraphobia with panic disorder: Secondary | ICD-10-CM

## 2023-07-22 DIAGNOSIS — R7989 Other specified abnormal findings of blood chemistry: Secondary | ICD-10-CM

## 2023-07-22 MED ORDER — CLONAZEPAM 0.5 MG PO TABS
ORAL_TABLET | ORAL | Status: DC
Start: 2023-07-22 — End: 2023-10-27
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
## 2023-07-23 ENCOUNTER — Other Ambulatory Visit: Payer: Self-pay | Admitting: Nurse Practitioner

## 2023-07-23 DIAGNOSIS — E039 Hypothyroidism, unspecified: Secondary | ICD-10-CM

## 2023-07-23 DIAGNOSIS — R7989 Other specified abnormal findings of blood chemistry: Secondary | ICD-10-CM

## 2023-07-30 DIAGNOSIS — E039 Hypothyroidism, unspecified: Secondary | ICD-10-CM | POA: Diagnosis not present

## 2023-08-03 DIAGNOSIS — E039 Hypothyroidism, unspecified: Secondary | ICD-10-CM | POA: Diagnosis not present

## 2023-08-03 DIAGNOSIS — R5383 Other fatigue: Secondary | ICD-10-CM | POA: Diagnosis not present

## 2023-08-11 DIAGNOSIS — R5383 Other fatigue: Secondary | ICD-10-CM | POA: Diagnosis not present

## 2023-08-14 ENCOUNTER — Other Ambulatory Visit: Payer: Self-pay | Admitting: Psychiatry

## 2023-08-14 DIAGNOSIS — F411 Generalized anxiety disorder: Secondary | ICD-10-CM

## 2023-08-14 DIAGNOSIS — F4001 Agoraphobia with panic disorder: Secondary | ICD-10-CM

## 2023-08-14 DIAGNOSIS — F422 Mixed obsessional thoughts and acts: Secondary | ICD-10-CM

## 2023-08-17 ENCOUNTER — Other Ambulatory Visit: Payer: Self-pay | Admitting: Nurse Practitioner

## 2023-08-17 DIAGNOSIS — E039 Hypothyroidism, unspecified: Secondary | ICD-10-CM

## 2023-08-17 DIAGNOSIS — R7989 Other specified abnormal findings of blood chemistry: Secondary | ICD-10-CM

## 2023-08-19 DIAGNOSIS — E7849 Other hyperlipidemia: Secondary | ICD-10-CM | POA: Diagnosis not present

## 2023-08-26 DIAGNOSIS — R5383 Other fatigue: Secondary | ICD-10-CM | POA: Diagnosis not present

## 2023-08-31 ENCOUNTER — Other Ambulatory Visit: Payer: Self-pay | Admitting: Psychiatry

## 2023-08-31 NOTE — Telephone Encounter (Signed)
LF 10/14; LV  10/9;

## 2023-09-08 DIAGNOSIS — R5383 Other fatigue: Secondary | ICD-10-CM | POA: Diagnosis not present

## 2023-09-10 ENCOUNTER — Other Ambulatory Visit: Payer: Self-pay | Admitting: Nurse Practitioner

## 2023-09-10 DIAGNOSIS — E039 Hypothyroidism, unspecified: Secondary | ICD-10-CM

## 2023-09-10 DIAGNOSIS — R7989 Other specified abnormal findings of blood chemistry: Secondary | ICD-10-CM

## 2023-09-19 DIAGNOSIS — Z79899 Other long term (current) drug therapy: Secondary | ICD-10-CM | POA: Diagnosis not present

## 2023-09-19 DIAGNOSIS — R600 Localized edema: Secondary | ICD-10-CM | POA: Diagnosis not present

## 2023-09-19 DIAGNOSIS — E063 Autoimmune thyroiditis: Secondary | ICD-10-CM | POA: Diagnosis not present

## 2023-09-19 DIAGNOSIS — R590 Localized enlarged lymph nodes: Secondary | ICD-10-CM | POA: Diagnosis not present

## 2023-09-23 DIAGNOSIS — E039 Hypothyroidism, unspecified: Secondary | ICD-10-CM | POA: Diagnosis not present

## 2023-09-23 DIAGNOSIS — E7849 Other hyperlipidemia: Secondary | ICD-10-CM | POA: Diagnosis not present

## 2023-09-23 DIAGNOSIS — R5383 Other fatigue: Secondary | ICD-10-CM | POA: Diagnosis not present

## 2023-09-28 DIAGNOSIS — E7849 Other hyperlipidemia: Secondary | ICD-10-CM | POA: Diagnosis not present

## 2023-09-28 DIAGNOSIS — E063 Autoimmune thyroiditis: Secondary | ICD-10-CM | POA: Diagnosis not present

## 2023-09-30 ENCOUNTER — Ambulatory Visit: Payer: Medicare Other | Admitting: Nurse Practitioner

## 2023-09-30 ENCOUNTER — Encounter: Payer: Self-pay | Admitting: Nurse Practitioner

## 2023-10-05 ENCOUNTER — Other Ambulatory Visit: Payer: Self-pay | Admitting: Psychiatry

## 2023-10-05 DIAGNOSIS — F411 Generalized anxiety disorder: Secondary | ICD-10-CM

## 2023-10-05 DIAGNOSIS — F422 Mixed obsessional thoughts and acts: Secondary | ICD-10-CM

## 2023-10-05 DIAGNOSIS — F4001 Agoraphobia with panic disorder: Secondary | ICD-10-CM

## 2023-10-07 ENCOUNTER — Other Ambulatory Visit: Payer: Self-pay | Admitting: Nurse Practitioner

## 2023-10-07 DIAGNOSIS — E039 Hypothyroidism, unspecified: Secondary | ICD-10-CM

## 2023-10-07 DIAGNOSIS — R7989 Other specified abnormal findings of blood chemistry: Secondary | ICD-10-CM

## 2023-10-11 ENCOUNTER — Other Ambulatory Visit: Payer: Self-pay | Admitting: Internal Medicine

## 2023-10-11 DIAGNOSIS — R7989 Other specified abnormal findings of blood chemistry: Secondary | ICD-10-CM

## 2023-10-11 DIAGNOSIS — E039 Hypothyroidism, unspecified: Secondary | ICD-10-CM

## 2023-10-11 MED ORDER — LEVOTHYROXINE SODIUM 150 MCG PO TABS
ORAL_TABLET | ORAL | 3 refills | Status: DC
Start: 2023-10-11 — End: 2024-02-09

## 2023-10-11 MED ORDER — LEVOTHYROXINE SODIUM 150 MCG PO TABS
ORAL_TABLET | ORAL | 3 refills | Status: DC
Start: 2023-10-11 — End: 2023-10-11

## 2023-10-12 DIAGNOSIS — E063 Autoimmune thyroiditis: Secondary | ICD-10-CM | POA: Diagnosis not present

## 2023-10-15 ENCOUNTER — Ambulatory Visit: Payer: Medicare Other | Admitting: Nurse Practitioner

## 2023-10-15 ENCOUNTER — Encounter: Payer: Medicare Other | Admitting: Nurse Practitioner

## 2023-10-19 ENCOUNTER — Encounter: Payer: Medicare Other | Admitting: Nurse Practitioner

## 2023-10-19 NOTE — Progress Notes (Deleted)
 CPE  Assessment:   Annual CPE Due annually  Health maintenance reviewed Healthily lifestyle goals set  Hypothyroidism, unspecified hypothyroidism type Following with [facility] Monitor TSH - elevated 03/20/23 Reminded to take on an empty stomach 30-42mins before food.  Stop any Biotin Supplement 48-72 hours before next TSH level to reduce the risk of falsely low TSH levels. Continue to monitor.    Mixed hyperlipidemia Discussed lifestyle modifications. Recommended diet heavy in fruits and veggies, omega 3's. Decrease consumption of animal meats, cheeses, and dairy products. Remain active and exercise as tolerated. Continue to monitor. 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 Geoffry continue meds, Requesting therapist - reference list provided.  Vitamin D  deficiency Continue supplement Monitor and check levels  Tobacco use disorder Follows with Pulmonology, Dr. Kassie Smoking cessation-  instruction/counseling given, counseled patient on the dangers of tobacco use, advised patient to stop smoking, and reviewed strategies to maximize success, patient not ready to quit at this time but discussed reducing use.    Other abnormal glucose Education: Reviewed 'ABCs' of diabetes management  Discussed goals to be met and/or maintained include A1C (<7) Blood pressure (<130/80) Cholesterol (LDL <70) Continue Eye Exam yearly  Continue Dental Exam Q6 mo Discussed dietary recommendations Discussed Physical Activity recommendations Check A1C  TMJ Improved; monitor   Morbid obesity (HCC) - BMI 35 with hld, htn Trending down - most likely due to TSH level WNL. Will re-submit [employer]  - possible appeal discussed. Discussed appropriate BMI Diet modification. Physical activity. Encouraged/praised to build confidence.  Hepatomegaly Weight loss encouraged, monitor LFTs  Family hx of cardiovascular  disease Has had normal EKGs, non-exertional though does have increased dyspnea  Low risk myoview 02/2010 Recent negative serial troponins during episode, felt atypical  However high risk, smoker, poor lifestyle, hld, family hx Patient very anxious; discussed CT coronary calcium  score with low threshold for referral back to cardiology Also emphasized risk reduction by lifestyle change and med Will control LDL <70 Reduce tobacco, caffeine;  Go to the ER if any chest pain, shortness of breath, nausea, dizziness, severe HA, changes vision/speech  Aortic arch atherosclerosis Continue to keep BG well controlled Continue lifestyle modifications.  Monitor lipids  Medication management All medications discussed and reviewed in full. All questions and concerns regarding medications addressed.       Notify office for further evaluation and treatment, questions or concerns if any reported s/s fail to improve.   The patient was advised to call back or seek an in-person evaluation if any symptoms worsen or if the condition fails to improve as anticipated.   Further disposition pending results of labs. Discussed med's effects and SE's.    I discussed the assessment and treatment plan with the patient. The patient was provided an opportunity to ask questions and all were answered. The patient agreed with the plan and demonstrated an understanding of the instructions.  Discussed med's effects and SE's. Screening labs and tests as requested with regular follow-up as recommended.  I provided 40 minutes of face-to-face time during this encounter including counseling, chart review, and critical decision making was preformed.   Future Appointments  Date Time Provider Department Center  10/21/2023  2:00 PM Cottle, Lorene KANDICE Raddle., MD [facility] None  05/19/2024  2:00 PM Laurice President, NP [facility] None  10/24/2024  2:00 PM Laurice President, NP [facility] None     Plan:   During the course of the visit the  patient was  educated and counseled about appropriate screening and preventive services including:   Pneumococcal vaccine  Prevnar 13 Influenza vaccine Td vaccine Screening electrocardiogram Bone densitometry screening Colorectal cancer screening Diabetes screening Glaucoma screening Nutrition counseling  Advanced directives: requested    Subjective:  Susan Bartlett is a 58 y.o. presents for follow up and AWV. She has Hepatomegaly; Mixed hyperlipidemia; Panic disorder; Anal fissure; Depression, major, in remission (HCC); Hypothyroidism; Morbid obesity (HCC) - BMI 35+ with htn, hld; Vitamin D  deficiency; Tobacco abuse; TMJ (dislocation of temporomandibular joint); Labile hypertension; Abnormal glucose; B12 deficiency; Aortic arch atherosclerosis (HCC) - per CTA 04/2021; Dyspnea on minimal exertion; and Muscular deconditioning on their problem list.   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 She suffered from very severe depression (niece passed away) and was inactive for two years. She is continuing to work on her overall mood.   She now follows with Dr. Geoffry who prescribes meds, has been working to increase activity by getting out and walk more. She is starting to sort through family household items, including her own.  This has helped some but she continues to feel overwhelmed.    Continues to report fatigue and SOB with DOE.  She continues to be a current every day smoker.  Smoking 1.5 packs/day, since she was 58 years old, no mass or nodule was noted on CTA in 05/09/2021, Plans to continue low dose screening.  Follows Pulmonology.  Uses Albuterol  only.  She does not use a maintenance inhaler.   She was reporting episodes of palpitations, had normal labs, EKG on 02/20/2021. She presented to ED 05/09/2021 with chest pain, felt atypical, thought to be NSAID related, no longer ntoiced.  She had negative serial troponins and EKG, had CTA unremarkable other than aortic  atherosclerosis and central bronchia thickening. She was Dr. Delford with low risk myoview in 24-Sep-2010. She remains very anxious about her cardiac risk. Significant family hx of CVD.   She has lumbar back pain, has seen Dr. Arnaldo at [facility] ortho, no surgery was advised. Will follow up for flares, resolved with NSAID taper.   BMI is There is no height or weight on file to calculate BMI. Weight is continuing to trend down.   Wt Readings from Last 3 Encounters:  05/19/23 233 lb (105.7 kg)  04/28/23 237 lb (107.5 kg)  03/20/23 241 lb 6.4 oz (109.5 kg)   Today their BP is  . She denies dyspnea, dizziness. Has had chest pain episodes.   She is on cholesterol medication, pravastatin  40 mg daily and denies myalgias. Her cholesterol is not at goal. The cholesterol last visit was:   Lab Results  Component Value Date   CHOL 149 03/20/2023   HDL 57 03/20/2023   LDLCALC 69 03/20/2023   TRIG 148 03/20/2023   CHOLHDL 2.6 03/20/2023   Last J8R in the office was:  Lab Results  Component Value Date   HGBA1C 5.6 03/20/2023   She has hx of vit D def. Patient was off of vitamin D  last check, back to taking 49999 IU 1 once weekly.  Lab Results  Component Value Date   VD25OH 30 11/04/2022   She is on thyroid  medication, with modification last blood work as TSH was elevated.  Lab Results  Component Value Date   TSH 1.57 05/19/2023     Current Outpatient Medications  Medication Instructions   albuterol  (VENTOLIN  HFA) 108 (90 Base) MCG/ACT inhaler INHALE 2 PUFFS BY MOUTH EVERY  6 HOURS AS NEEDED FOR WHEEZING OR SHORTNESS OF BREATH   clonazePAM  (KLONOPIN ) 0.5 MG tablet TAKE 1 TABLET TWICE DAILY AND 2 TABLET AT NIGHT AS NEEDED FOR ANXIETY   clonazePAM  (KLONOPIN ) 1 MG tablet TAKE 1/2 TABLET BY MOUTH TWICE DAILY AND TAKE 1 TABLET BY MOUTH AT NIGHT AS NEEDED FOR ANXIETY   clotrimazole -betamethasone  (LOTRISONE ) cream 1 application , Topical, 2 times daily   gabapentin  (NEURONTIN ) 100 MG capsule TAKE 1 TO  3 CAPSULES BY MOUTH AS NEEDED FOR  RESTLESS  LEGS   levothyroxine  (SYNTHROID ) 150 MCG tablet Take  1 tablet  Daily  on an empty stomach with only water for 30 minutes & no Antacid meds, Calcium  or Magnesium for 4 hours & avoid Biotin       /       TAKE      BY       MOUTH   mometasone (NASONEX) 50 MCG/ACT nasal spray 2 sprays, Nasal, Daily   Omega-3 Fatty Acids (FISH OIL) 1200 MG CAPS Oral, Daily   Phenylephrine-APAP-guaiFENesin (TYLENOL SINUS SEVERE PO) Oral   rosuvastatin  (CRESTOR ) 20 MG tablet Take 1 tab daily in the evening for cholesterol to help reduce heart attack/stroke risk.   sertraline  (ZOLOFT ) 100 MG tablet TAKE 2 & 1/2 (TWO & ONE-HALF) TABLETS BY MOUTH ONCE DAILY   sodium chloride  (OCEAN) 0.65 % SOLN nasal spray 1 spray, Each Nare, As needed   triamcinolone  (NASACORT ) 55 MCG/ACT AERO nasal inhaler 2 sprays, Nasal, Daily   varenicline  (CHANTIX ) 0.5 mg, Oral, 2 times daily   Vitamin D3 1.25 mg, Oral, Weekly   Wegovy  0.25 mg, Subcutaneous, Weekly     Current Problems (verified) Patient Active Problem List   Diagnosis Date Noted   Muscular deconditioning 04/24/2022   Dyspnea on minimal exertion 10/31/2021   B12 deficiency 09/12/2021   Aortic arch atherosclerosis (HCC) - per CTA 04/2021 09/12/2021   Abnormal glucose 11/24/2018   Labile hypertension 03/05/2018   Tobacco abuse 01/16/2015   TMJ (dislocation of temporomandibular joint) 01/16/2015   Vitamin D  deficiency    Hepatomegaly    Mixed hyperlipidemia    Panic disorder    Anal fissure    Depression, major, in remission (HCC)    Hypothyroidism    Morbid obesity (HCC) - BMI 35+ with htn, hld     Immunization History  Administered Date(s) Administered   Influenza Split 08/01/2013   Td 11/18/2012   Preventative care: Tetanus: 11/2012 Pneumovax: declines Pneum 20: declines Flu vaccines declines Shingrix: will consider  Covid 19: declines   Pap:  10/2019, Dr. Cleotilde MGM: 04/2023 DEXA: 2008, heel  neg  Colonoscopy: 11/2014 due 10 years EGD: N/A  PFT: 01/2013 Stress test: 02/2010 [place])  CTA chest: 05/09/2021, no nodules, aortic atherosclerosis   Names of Other Physician/Practitioners you currently use: 1. [facility] here for primary care 2.  [facility], eye doctor, last visit 1999 3. Dr. Odis Bihari , dentist, last 2023, goes q29m   Patient Care Team: Tonita Fallow, MD as PCP - General (Internal Medicine) Delford Maude BROCKS, MD as PCP - Cardiology (Cardiology) Delford Maude BROCKS, MD as Consulting Physician (Cardiology) Avram Lupita BRAVO, MD as Consulting Physician (Gastroenterology) Cleotilde Ronal RAMAN, MD as Consulting Physician (Gynecology) Cottle, Lorene KANDICE Raddle., MD as Attending Physician (Psychiatry)    Allergies No Known Allergies  SURGICAL HISTORY She  has a past surgical history that includes Hemorroidectomy; Cholecystectomy; Left oophorectomy; and Tonsillectomy. FAMILY HISTORY Her family history  includes Alcohol abuse in her sister; Breast cancer in her cousin and mother; Cirrhosis in her father; Colon cancer (age of onset: 57) in her maternal grandmother; Diabetes in her mother; Drug abuse in her brother; Heart disease in her father. SOCIAL HISTORY She  reports that she has been smoking cigarettes. She started smoking about 35 years ago. She has a 35 pack-year smoking history. She has never used smokeless tobacco. She reports current alcohol use. She reports that she does not use drugs.  MEDICARE WELLNESS OBJECTIVES: Physical activity:   Cardiac risk factors:   Depression/mood screen:      04/28/2023    2:48 PM  Depression screen PHQ 2/9  Decreased Interest 0  Down, Depressed, Hopeless 0  PHQ - 2 Score 0    ADLs:      No data to display           Cognitive Testing  Alert? Yes  Normal Appearance?Yes  Oriented to person? Yes  Place? Yes   Time? Yes  Recall of three objects?  Yes  Can perform simple  calculations? Yes  Displays appropriate judgment?Yes  Can read the correct time from a watch face?Yes  EOL planning:       Review of Systems  Constitutional:  Negative for chills, fever, malaise/fatigue and weight loss.  HENT:  Negative for congestion, ear pain, hearing loss, sore throat and tinnitus.   Eyes:  Negative for blurred vision and double vision.  Respiratory:  Positive for shortness of breath. Negative for cough, sputum production and wheezing.   Cardiovascular:  Positive for palpitations. Negative for chest pain, orthopnea, claudication and leg swelling.  Gastrointestinal:  Negative for abdominal pain, blood in stool, constipation, diarrhea, heartburn, melena, nausea and vomiting.  Genitourinary: Negative.   Musculoskeletal:  Positive for falls. Negative for joint pain and myalgias.  Skin:  Negative for rash.  Neurological:  Negative for dizziness, tingling, sensory change, weakness and headaches.  Endo/Heme/Allergies:  Negative for polydipsia.  Psychiatric/Behavioral:  Positive for depression. Negative for hallucinations, substance abuse and suicidal ideas. The patient is nervous/anxious. The patient does not have insomnia.   All other systems reviewed and are negative.   Objective:     Last menstrual period 08/04/2012. There is no height or weight on file to calculate BMI.  General appearance: alert, no distress, WD/WN, female HEENT: normocephalic, sclerae anicteric, TMs pearly, nares patent, no discharge or erythema, pharynx normal Oral cavity: MMM, no lesions Neck: supple, no lymphadenopathy, no thyromegaly, no masses Heart: RRR, normal S1, S2, no murmurs Lungs: CTA bilaterally, no wheezes, rhonchi, or rales Abdomen: +bs, soft, non tender, non distended, no masses, no hepatomegaly, no splenomegaly Musculoskeletal: nontender, no swelling, no obvious deformity Extremities: no edema, no cyanosis, no clubbing Pulses: 2+ symmetric, upper and lower extremities, normal  cap refill Neurological: alert, oriented x 3, CN2-12 intact, strength normal upper extremities and lower extremities, sensation normal throughout, normal gait Psychiatric: mildly anxious affect, behavior normal, pleasant     Medicare Attestation I have personally reviewed: The patient's medical and social history Their use of alcohol, tobacco or illicit drugs Their current medications and supplements The patient's functional ability including ADLs,fall risks, home safety risks, cognitive, and hearing and visual impairment Diet and physical activities Evidence for depression or mood disorders  The patient's weight, height, BMI, and visual acuity have been recorded in the chart.  I have made referrals, counseling, and provided education to the patient based on review of the above and I have provided the  patient with a written personalized care plan for preventive services.     BASCOM NECESSARY, NP   10/19/2023

## 2023-10-21 ENCOUNTER — Ambulatory Visit (INDEPENDENT_AMBULATORY_CARE_PROVIDER_SITE_OTHER): Payer: Medicare Other | Admitting: Psychiatry

## 2023-10-22 ENCOUNTER — Encounter: Payer: Self-pay | Admitting: Internal Medicine

## 2023-10-27 ENCOUNTER — Ambulatory Visit: Payer: Medicare Other | Admitting: Psychiatry

## 2023-10-27 ENCOUNTER — Encounter: Payer: Self-pay | Admitting: Psychiatry

## 2023-10-27 DIAGNOSIS — F331 Major depressive disorder, recurrent, moderate: Secondary | ICD-10-CM | POA: Diagnosis not present

## 2023-10-27 DIAGNOSIS — F5105 Insomnia due to other mental disorder: Secondary | ICD-10-CM

## 2023-10-27 DIAGNOSIS — G2581 Restless legs syndrome: Secondary | ICD-10-CM

## 2023-10-27 DIAGNOSIS — F411 Generalized anxiety disorder: Secondary | ICD-10-CM | POA: Diagnosis not present

## 2023-10-27 DIAGNOSIS — F422 Mixed obsessional thoughts and acts: Secondary | ICD-10-CM

## 2023-10-27 DIAGNOSIS — F4001 Agoraphobia with panic disorder: Secondary | ICD-10-CM

## 2023-10-27 DIAGNOSIS — R7989 Other specified abnormal findings of blood chemistry: Secondary | ICD-10-CM

## 2023-10-27 MED ORDER — CLONAZEPAM 1 MG PO TABS: ORAL_TABLET | ORAL | 5 refills | Status: DC

## 2023-10-27 MED ORDER — SERTRALINE HCL 100 MG PO TABS: ORAL_TABLET | ORAL | 5 refills | Status: DC
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
## 2023-11-05 ENCOUNTER — Encounter: Payer: Medicare Other | Admitting: Nurse Practitioner

## 2023-11-12 DIAGNOSIS — E063 Autoimmune thyroiditis: Secondary | ICD-10-CM | POA: Diagnosis not present

## 2023-11-24 ENCOUNTER — Encounter (HOSPITAL_BASED_OUTPATIENT_CLINIC_OR_DEPARTMENT_OTHER): Payer: Self-pay | Admitting: Obstetrics & Gynecology

## 2023-12-02 DIAGNOSIS — E063 Autoimmune thyroiditis: Secondary | ICD-10-CM | POA: Diagnosis not present

## 2023-12-03 ENCOUNTER — Telehealth: Payer: Self-pay | Admitting: Family Medicine

## 2023-12-03 ENCOUNTER — Telehealth: Payer: Self-pay

## 2023-12-03 NOTE — Telephone Encounter (Signed)
Copied from CRM [id number]. Topic: Appointments - Scheduling Inquiry for Clinic >> Dec 02, 2023  4:47 PM Shon Hale wrote: Reason for CRM: Pt referred to Enid Skeens, NP by Dr. Leda Quail at [facility] for a new patient. Pt inquiring if Enid Skeens, NP would accept her as new patient per Dr. Rondel Baton recommendation.   Please advise

## 2023-12-03 NOTE — Telephone Encounter (Signed)
Called pt, no answer, left voicemail stating [facility] is not accepting new pt at this time.

## 2023-12-08 NOTE — Telephone Encounter (Signed)
 It appears pt may have scheduled elsewhere.

## 2023-12-15 ENCOUNTER — Ambulatory Visit (INDEPENDENT_AMBULATORY_CARE_PROVIDER_SITE_OTHER): Payer: Medicare Other | Admitting: Professional Counselor

## 2023-12-15 DIAGNOSIS — Z0389 Encounter for observation for other suspected diseases and conditions ruled out: Secondary | ICD-10-CM

## 2023-12-15 NOTE — Progress Notes (Signed)
 Pt did not show for scheduled appointment. No message.  Bartolo Darter, [facility]

## 2023-12-29 ENCOUNTER — Ambulatory Visit: Admitting: Professional Counselor

## 2024-01-06 ENCOUNTER — Ambulatory Visit: Admitting: Professional Counselor

## 2024-01-06 ENCOUNTER — Encounter: Payer: Self-pay | Admitting: Professional Counselor

## 2024-01-06 DIAGNOSIS — F411 Generalized anxiety disorder: Secondary | ICD-10-CM | POA: Diagnosis not present

## 2024-01-06 DIAGNOSIS — F330 Major depressive disorder, recurrent, mild: Secondary | ICD-10-CM | POA: Diagnosis not present
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
## 2024-01-15 ENCOUNTER — Ambulatory Visit: Admitting: Professional Counselor

## 2024-01-15 ENCOUNTER — Encounter: Payer: Self-pay | Admitting: Professional Counselor

## 2024-01-15 DIAGNOSIS — F411 Generalized anxiety disorder: Secondary | ICD-10-CM

## 2024-01-15 DIAGNOSIS — F330 Major depressive disorder, recurrent, mild: Secondary | ICD-10-CM | POA: Diagnosis not present
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
## 2024-02-09 ENCOUNTER — Telehealth: Payer: Self-pay | Admitting: *Deleted

## 2024-02-09 ENCOUNTER — Other Ambulatory Visit: Payer: Self-pay | Admitting: Family

## 2024-02-09 DIAGNOSIS — R7989 Other specified abnormal findings of blood chemistry: Secondary | ICD-10-CM

## 2024-02-09 DIAGNOSIS — E039 Hypothyroidism, unspecified: Secondary | ICD-10-CM

## 2024-02-09 MED ORDER — LEVOTHYROXINE SODIUM 150 MCG PO TABS: ORAL_TABLET | ORAL | 0 refills | Status: DC

## 2024-02-09 NOTE — Telephone Encounter (Signed)
 Copied from CRM [id number]. Topic: Clinical - Medication Question >> Feb 09, 2024 10:08 AM Susan Bartlett wrote: Patient's pcp has passed away and she is out of the -levothyroxine  (SYNTHROID ) 150 MCG tablet The pharmacy told her to get her new pcp to write her a prescription for it she wants to know if this can be filled before her appointment on Feb 26, 2024.   [facility] - [place], [place] - [address] [place] [address] Phone: [phone] Fax: [phone] Hours: Not open 24 hours

## 2024-02-10 NOTE — Telephone Encounter (Signed)
 Noted.

## 2024-02-17 DIAGNOSIS — M79672 Pain in left foot: Secondary | ICD-10-CM | POA: Insufficient documentation

## 2024-02-17 DIAGNOSIS — M25562 Pain in left knee: Secondary | ICD-10-CM | POA: Insufficient documentation

## 2024-02-19 ENCOUNTER — Encounter (HOSPITAL_COMMUNITY): Payer: Self-pay

## 2024-02-23 ENCOUNTER — Encounter (HOSPITAL_BASED_OUTPATIENT_CLINIC_OR_DEPARTMENT_OTHER): Payer: Self-pay | Admitting: Obstetrics & Gynecology

## 2024-02-24 ENCOUNTER — Ambulatory Visit: Admitting: Professional Counselor

## 2024-02-24 ENCOUNTER — Ambulatory Visit: Payer: Medicare Other | Admitting: Psychiatry

## 2024-02-24 ENCOUNTER — Encounter: Payer: Self-pay | Admitting: Psychiatry

## 2024-02-24 ENCOUNTER — Encounter: Payer: Self-pay | Admitting: Professional Counselor

## 2024-02-24 ENCOUNTER — Telehealth (HOSPITAL_BASED_OUTPATIENT_CLINIC_OR_DEPARTMENT_OTHER): Admitting: Obstetrics & Gynecology

## 2024-02-24 DIAGNOSIS — F330 Major depressive disorder, recurrent, mild: Secondary | ICD-10-CM

## 2024-02-24 DIAGNOSIS — F422 Mixed obsessional thoughts and acts: Secondary | ICD-10-CM

## 2024-02-24 DIAGNOSIS — Z780 Asymptomatic menopausal state: Secondary | ICD-10-CM | POA: Diagnosis not present

## 2024-02-24 DIAGNOSIS — R7989 Other specified abnormal findings of blood chemistry: Secondary | ICD-10-CM

## 2024-02-24 DIAGNOSIS — G2581 Restless legs syndrome: Secondary | ICD-10-CM

## 2024-02-24 DIAGNOSIS — F5105 Insomnia due to other mental disorder: Secondary | ICD-10-CM

## 2024-02-24 DIAGNOSIS — F4001 Agoraphobia with panic disorder: Secondary | ICD-10-CM

## 2024-02-24 DIAGNOSIS — F411 Generalized anxiety disorder: Secondary | ICD-10-CM

## 2024-02-24 MED ORDER — SERTRALINE HCL 100 MG PO TABS: ORAL_TABLET | ORAL | 5 refills | Status: DC

## 2024-02-24 MED ORDER — CLONAZEPAM 1 MG PO TABS
ORAL_TABLET | ORAL | 5 refills | Status: DC
Start: 2024-02-24 — End: 2024-05-02

## 2024-02-24 NOTE — Telephone Encounter (Signed)
 Labs reviewed and pt and I discussed this morning at office visit.
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
## 2024-02-24 NOTE — Progress Notes (Signed)
 Virtual Visit via Video Note  I connected with Lenetta Quest on 02/24/24 at  8:15 AM EDT by a video enabled telemedicine application and verified that I am speaking with the correct person using two identifiers.  Location: Patient: home Provider: office   I discussed the limitations of evaluation and management by telemedicine and the availability of in person appointments. The patient expressed understanding and agreed to proceed.  History of Present Illness: 58 yo G1P1 SWF who wants to discuss possible hormone replacement therapy use.  She has been going to [facility] for compounded Wegovy  and has lost 37 pounds since starting.  Reports every time she has an appointment, hormones are recommended.  She has not been on hormone replacement therapy due to concerns about personal risk.  When she brought this up, she felt these were dismissed.  She would like my opinion about HRT use and whether I feel that is safe.  Discussed risks regarding HRT including breast cancer risk, cardiovascular risk with MI and stroke, DVT/PE.  Patient is a smoker.  She did have a coronary CT that showed small area of mixed plaque with some minimal stenosis in the LAD is where well less in the circumflex artery.  She was in the 70th percentile for her age and gender.  She is on a statin.  She does not have hot flashes.  She does not have noted vaginal dryness.  She is not sexually active.  She does not have too much issue with sleep.  Her cardiovascular risks outweigh the benefits.  My recommendation would be to not start HRT.  I congratulated her on her weight loss.    Does have upcoming appointment with new PCP, Dr. Waldo Guitar.   Observations/Objective: Well-developed white female, no acute distress  Assessment and Plan:   Follow Up Instructions: 1. Postmenopausal (Primary) - I currently would not recommend pt starting HRT due to cardiovascular risks.  As she is really not having a lot of vasomotor symptoms, I feel she  has a lot more risks compared to benefits.  She is comfortable with this discussion.  She is going to continue her Wegovy .  All questions answered.   I discussed the assessment and treatment plan with the patient. The patient was provided an opportunity to ask questions and all were answered. The patient agreed with the plan and demonstrated an understanding of the instructions.   The patient was advised to call back or seek an in-person evaluation if the symptoms worsen or if the condition fails to improve as anticipated.  I provided 32 minutes of non-face-to-face time during this encounter.   Lillian Rein, MD
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
## 2024-02-26 ENCOUNTER — Encounter: Payer: Self-pay | Admitting: Family Medicine

## 2024-02-26 ENCOUNTER — Ambulatory Visit (INDEPENDENT_AMBULATORY_CARE_PROVIDER_SITE_OTHER): Payer: Medicare Other | Admitting: Family Medicine

## 2024-02-26 VITALS — BP 119/69 | HR 90 | Temp 98.3°F | Ht 68.0 in | Wt 208.6 lb

## 2024-02-26 DIAGNOSIS — E538 Deficiency of other specified B group vitamins: Secondary | ICD-10-CM

## 2024-02-26 DIAGNOSIS — R531 Weakness: Secondary | ICD-10-CM | POA: Diagnosis not present

## 2024-02-26 DIAGNOSIS — E782 Mixed hyperlipidemia: Secondary | ICD-10-CM

## 2024-02-26 DIAGNOSIS — E033 Postinfectious hypothyroidism: Secondary | ICD-10-CM

## 2024-02-26 DIAGNOSIS — E559 Vitamin D deficiency, unspecified: Secondary | ICD-10-CM

## 2024-02-26 LAB — CBC WITH DIFFERENTIAL/PLATELET
Basophils Absolute: 0.1 10*3/uL (ref 0.0–0.1)
Basophils Relative: 0.8 % (ref 0.0–3.0)
Eosinophils Absolute: 0.3 10*3/uL (ref 0.0–0.7)
Eosinophils Relative: 5.2 % — ABNORMAL HIGH (ref 0.0–5.0)
HCT: 41.2 % (ref 36.0–46.0)
Hemoglobin: 13.9 g/dL (ref 12.0–15.0)
Lymphocytes Relative: 25.4 % (ref 12.0–46.0)
Lymphs Abs: 1.7 10*3/uL (ref 0.7–4.0)
MCHC: 33.8 g/dL (ref 30.0–36.0)
MCV: 89.7 fl (ref 78.0–100.0)
Monocytes Absolute: 0.4 10*3/uL (ref 0.1–1.0)
Monocytes Relative: 5.4 % (ref 3.0–12.0)
Neutro Abs: 4.1 10*3/uL (ref 1.4–7.7)
Neutrophils Relative %: 63.2 % (ref 43.0–77.0)
Platelets: 242 10*3/uL (ref 150.0–400.0)
RBC: 4.59 Mil/uL (ref 3.87–5.11)
RDW: 13.4 % (ref 11.5–15.5)
WBC: 6.6 10*3/uL (ref 4.0–10.5)

## 2024-02-26 LAB — COMPREHENSIVE METABOLIC PANEL WITH GFR
ALT: 25 U/L (ref 0–35)
AST: 30 U/L (ref 0–37)
Albumin: 4.5 g/dL (ref 3.5–5.2)
Alkaline Phosphatase: 99 U/L (ref 39–117)
BUN: 9 mg/dL (ref 6–23)
CO2: 27 meq/L (ref 19–32)
Calcium: 9.9 mg/dL (ref 8.4–10.5)
Chloride: 102 meq/L (ref 96–112)
Creatinine, Ser: 0.6 mg/dL (ref 0.40–1.20)
GFR: 98.33 mL/min (ref >60.00)
Glucose, Bld: 91 mg/dL (ref 70–99)
Potassium: 4.5 meq/L (ref 3.5–5.1)
Sodium: 136 meq/L (ref 135–145)
Total Bilirubin: 0.3 mg/dL (ref 0.2–1.2)
Total Protein: 6.6 g/dL (ref 6.0–8.3)

## 2024-02-26 LAB — LIPID PANEL
Cholesterol: 142 mg/dL (ref 0–200)
HDL: 46.4 mg/dL (ref >39.00)
LDL Cholesterol: 81 mg/dL (ref 0–99)
NonHDL: 95.5
Total CHOL/HDL Ratio: 3
Triglycerides: 73 mg/dL (ref 0.0–149.0)
VLDL: 14.6 mg/dL (ref 0.0–40.0)

## 2024-02-26 LAB — CK: Total CK: 39 U/L (ref 7–177)

## 2024-02-26 LAB — HEMOGLOBIN A1C: Hgb A1c MFr Bld: 5.5 % (ref 4.6–6.5)

## 2024-02-26 LAB — SEDIMENTATION RATE: Sed Rate: 6 mm/h (ref 0–30)

## 2024-02-26 LAB — C-REACTIVE PROTEIN: CRP: <1 mg/dL (ref 0.5–20.0)

## 2024-02-26 NOTE — Patient Instructions (Signed)

## 2024-02-26 NOTE — Progress Notes (Signed)
 New Patient Office Visit  Subjective:  Patient ID: Susan Bartlett, female    DOB: [birth year]  Age: 58 y.o. MRN: [id number]  CC:  Chief Complaint  Patient presents with   Establish Care    HPI [name] presents for new pt, thy Edema and bruises   hsp?  ER-"inflammed".  Fx L foot Thy ab + [place]. 12/11  Discussed the use of AI scribe software for clinical note transcription with the patient, who gave verbal consent to proceed.  History of Present Illness Susan Bartlett is a 58 year old female who presents with leg swelling and thyroid  concerns.  She experiences significant swelling in her legs,only twice after recent trips to [facility] and [place] , where she engaged in extensive walking and standing. In December, she experienced severe swelling and spontaneous bruising, prompting an emergency room visit at [facility] in [place]. An ultrasound was performed, and she recalls being told there was inflammation. The swelling subsided within 24 hours. In April, after a trip to [place] , she experienced similar swelling without bruising, which resolved in 48 hours. She notes that her legs feel weak and she experiences balance issues, particularly in the mornings. No dizziness or lightheadedness nor SOB.  She has a history of thyroid  issues and is concerned about recent lab results showing elevated thyroid  antibodies. A TPO antibody test showed a level of 1675, significantly higher than the expected . Her TSH was 5.8, free T3 was 2.2, and anti-TPO was 1428. She is currently on 150 mcg of Synthroid  daily and has been on thyroid  medication for over ten years.   She also takes semaglutide  and low-dose naltrexone for wt loss-gets from [facility].  She has a history of anxiety, depression, and panic attacks, which are managed by her psychiatrist. She experiences occasional shortness of breath associated with panic attacks or after walking long distances. She has a history of agoraphobia but has been able  to travel recently, which she considers a significant achievement.  She has a history of prediabetes, but recent blood work did not indicate current prediabetes. She is on cholesterol medication and has lost 37 pounds with the help of semaglutide . She reports occasional use of diet [facility] and follows a mostly gluten-free diet.  She has a history of restless legs and reports weakness and balance issues in her legs, which she attributes to potential footwear issues. She has experienced calluses and swelling, leading her to purchase barefoot shoes for better toe splay.     Current Outpatient Medications:    albuterol  (VENTOLIN  HFA) 108 (90 Base) MCG/ACT inhaler, INHALE 2 PUFFS BY MOUTH EVERY 6 HOURS AS NEEDED FOR WHEEZING OR SHORTNESS OF BREATH, Disp: 18 g, Rfl: 2   Cholecalciferol (VITAMIN D3) 1.25 MG (50000 UT) CAPS, Take 1 capsule by mouth once a week, Disp: 12 capsule, Rfl: 3   clonazePAM  (KLONOPIN ) 1 MG tablet, TAKE 1/2 TABLET BY MOUTH TWICE DAILY AND TAKE 1 TABLET BY MOUTH AT NIGHT AS NEEDED FOR ANXIETY, Disp: 60 tablet, Rfl: 5   clotrimazole -betamethasone  (LOTRISONE ) cream, Apply 1 application. topically 2 (two) times daily., Disp: 15 g, Rfl: 2   gabapentin  (NEURONTIN ) 100 MG capsule, TAKE 1 TO 3 CAPSULES BY MOUTH AS NEEDED FOR  RESTLESS  LEGS, Disp: 90 capsule, Rfl: 0   levothyroxine  (SYNTHROID ) 150 MCG tablet, Take  1 tablet  Daily  on an empty stomach with only water for 30 minutes & no Antacid meds, Calcium  or Magnesium for  4 hours & avoid Biotin       /       TAKE      BY       MOUTH, Disp: 90 tablet, Rfl: 0   mometasone (NASONEX) 50 MCG/ACT nasal spray, Place 2 sprays into the nose daily., Disp: , Rfl:    Omega-3 Fatty Acids (FISH OIL) 1200 MG CAPS, Take by mouth daily., Disp: , Rfl:    Phenylephrine-APAP-guaiFENesin (TYLENOL SINUS SEVERE PO), Take by mouth., Disp: , Rfl:    rosuvastatin  (CRESTOR ) 20 MG tablet, Take 1 tab daily in the evening for cholesterol to help reduce heart  attack/stroke risk., Disp: 90 tablet, Rfl: 3   Semaglutide -Weight Management (WEGOVY ) 0.25 MG/0.5ML SOAJ, Inject 0.25 mg into the skin once a week., Disp: 2 mL, Rfl: 2   sertraline  (ZOLOFT ) 100 MG tablet, 1 tablet in the morning and 2 tablets at night, Disp: 90 tablet, Rfl: 5   sodium chloride  (OCEAN) 0.65 % SOLN nasal spray, Place 1 spray into both nostrils as needed for congestion., Disp: , Rfl:    triamcinolone  (NASACORT ) 55 MCG/ACT AERO nasal inhaler, Place 2 sprays into the nose daily., Disp: , Rfl:    varenicline  (CHANTIX ) 0.5 MG tablet, Take 1 tablet (0.5 mg total) by mouth 2 (two) times daily., Disp: 30 tablet, Rfl: 0  Past Medical History:  Diagnosis Date   Anal fissure 1994   Anxiety    Arthritis    Dr. Jacqulyne Maxim at [facility]   Depression    Endometriosis    Fracture 08/2011   fracture left wrist and elbow   Hemorrhoids 2008   Hepatomegaly    Hypothyroidism    Mixed hyperlipidemia    Obesity    OCD (obsessive compulsive disorder)    Panic disorder    Plantar fasciitis, right 03/2014   Prediabetes 01/16/2015   RLS (restless legs syndrome) 2014   vis sleep study- no sleep apnea   Shingles 07/2017   Tobacco abuse    not tolerated Chantix  in the past   Vitamin D  deficiency     Past Surgical History:  Procedure Laterality Date   CHOLECYSTECTOMY     HEMORROIDECTOMY     LEFT OOPHORECTOMY     TONSILLECTOMY      Family History  Problem Relation Age of Onset   Breast cancer Mother 28 - 35   Diabetes Mother    Cirrhosis Father        alcohol   Heart disease Father        MI   Cancer Sister 22       anal   Alcohol abuse Sister    Drug abuse Brother        and alcohol   Colon cancer Maternal Grandmother 74   Breast cancer Cousin    Thyroid  disease Neg Hx     Social History   Socioeconomic History   Marital status: Single    Spouse name: Not on file   Number of children: 1   Years of education: Not on file   Highest education level: Not on file   Occupational History   Occupation: Agricultural consultant: [employer]  Tobacco Use   Smoking status: Every Day    Current packs/day: 1.00    Average packs/day: 1 pack/day for 35.4 years (35.4 ttl pk-yrs)    Types: Cigarettes    Start date: 1990   Smokeless tobacco: Never   Tobacco comments:    Previously 1.5 pack  per day, has been tapering down, as of 04/02/2022 reports down to 3 cigs/day  Vaping Use   Vaping status: Never Used  Substance and Sexual Activity   Alcohol use: Yes    Alcohol/week: 0.0 standard drinks of alcohol    Comment: occ glass of wine   Drug use: No   Sexual activity: Not Currently    Birth control/protection: Post-menopausal  Other Topics Concern   Not on file  Social History Narrative   Not on file   Social Drivers of Health   Financial Resource Strain: Not on file  Food Insecurity: Not on file  Transportation Needs: Not on file  Physical Activity: Not on file  Stress: Not on file  Social Connections: Not on file  Intimate Partner Violence: Not on file    ROS  ROS: Gen: no fever, chills  Skin: no rash, itching ENT: no ear pain, ear drainage, nasal congestion, rhinorrhea, sinus pressure, sore throat Eyes: no blurry vision, double vision Resp: no cough, wheeze,SOB CV: no CP, palpitations, LE edema,  GI: no heartburn, n/v/d/c, abd pain GU: no dysuria, urgency, frequency, hematuria MSK: no joint pain, myalgias, back pain Neuro: no dizziness, headache, weakness, vertigo Psych: no depression, anxiety, insomnia, SI   Objective:   Today's Vitals: BP 119/69   Pulse 90   Temp 98.3 F (36.8 C)   Ht 5\' 8"  (1.727 m)   Wt 208 lb 9.6 oz (94.6 kg)   LMP 08/04/2012   SpO2 98%   BMI 31.72 kg/m   Physical Exam  Gen: WDWN NAD HEENT: NCAT, conjunctiva not injected, sclera nonicteric TM WNL B, OP moist, no exudates  NECK:  supple, no thyromegaly, no nodes, no carotid bruits CARDIAC: RRR, S1S2+, no murmur. DP 2+B LUNGS: CTAB. No wheezes ABDOMEN:   BS+, soft, NTND, No HSM, no masses EXT:  no edema MSK: no gross abnormalities. MS 5/5 all 4 NEURO: A&O x3.  CN II-XII intact.  PSYCH: normal mood. Good eye contact   Assessment & Plan:  Mixed hyperlipidemia -     Comprehensive metabolic panel with GFR -     Hemoglobin A1c -     Lipid panel -     CK  Weakness -     CBC with Differential/Platelet -     Comprehensive metabolic panel with GFR -     Hemoglobin A1c -     Lipid panel -     T3, free -     T4, free -     TSH -     VITAMIN D  25 Hydroxy (Vit-D Deficiency, Fractures) -     Vitamin B12 -     Sedimentation rate -     CK -     C-reactive protein  Postinfectious hypothyroidism -     T3, free -     T4, free -     TSH  B12 deficiency -     Vitamin B12  Vitamin D  deficiency -     VITAMIN D  25 Hydroxy (Vit-D Deficiency, Fractures)  Assessment and Plan Assessment & Plan Leg swelling and bruising   She experiences intermittent leg swelling and bruising,only after flights and prolonged standing. A previous ER visit in December showed no blood clots on ultrasound. Bruising may be due to vasculitis or venous insufficiency, and swelling resolves within 24-48 hours post-travel. Henoch-Schnlein purpura is considered due to bruising and inflammation. Weakness and balance issues may relate to footwear or other underlying conditions. She should wear compression stockings during  flights and prolonged walking. Blood work is ordered to evaluate for autoimmune conditions and vitamin deficiencies. Venous insufficiency is considered as a potential cause of swelling.  Hashimoto's thyroiditis   She has Hashimoto's thyroiditis with elevated anti-TPO antibodies. TSH was 5.8, free T3 was 2.2, and anti-TPO was 1428 in February. No recent adjustment in Synthroid  dosage, currently on 150 mcg. Anti-TPO levels confirm Hashimoto's thyroiditis. Continue current Synthroid  dosage of 150 mcg and review thyroid  function tests regularly.  Anxiety and panic  disorder   She has anxiety and panic disorder with agoraphobia. A recent panic attack occurred while traveling to the psychiatrist. Managed with the current psychiatric regimen, and no suicidal ideation is reported. Virtual visits are an option if in-person visits become challenging.  Depression   Depression is managed with the current psychiatric regimen, and no suicidal ideation is reported. Continue current psychiatric management.  Restless legs syndrome   Restless legs syndrome is noted in her medical history.  Prediabetes   Recent labs do not indicate current prediabetes. She is on cholesterol medication and has lost weight with semaglutide , which may have improved glucose metabolism. Continue monitoring glucose levels and maintain a healthy lifestyle.  HLD-chronic. LDL controlled. currently on crestor  20mg .  Check labs  General Health Maintenance   There was a discussion about vitamin D  supplementation, considering daily versus weekly dosing based on compliance. Consider daily vitamin D  supplementation if compliance with weekly dosing is challenging.  Follow-up   A follow-up is needed to review blood work results and further evaluate leg swelling and weakness. Schedule a follow-up appointment in a few weeks and consider a virtual visit if an in-person visit is not feasible.    Follow-up: Return in about 4 weeks (around 03/25/2024) for chronic follow-up.   Ellsworth Haas, MD

## 2024-02-27 ENCOUNTER — Encounter (HOSPITAL_BASED_OUTPATIENT_CLINIC_OR_DEPARTMENT_OTHER): Payer: Self-pay | Admitting: Obstetrics & Gynecology

## 2024-03-01 ENCOUNTER — Ambulatory Visit: Payer: Self-pay | Admitting: Family Medicine

## 2024-03-01 LAB — TSH: TSH: 0.35 u[IU]/mL (ref 0.35–5.50)

## 2024-03-01 LAB — VITAMIN B12: Vitamin B-12: 157 pg/mL — ABNORMAL LOW (ref 211–911)

## 2024-03-01 LAB — T4, FREE: Free T4: 1.19 ng/dL (ref 0.60–1.60)

## 2024-03-01 LAB — T3, FREE: T3, Free: 3.3 pg/mL (ref 2.3–4.2)

## 2024-03-01 LAB — VITAMIN D 25 HYDROXY (VIT D DEFICIENCY, FRACTURES): VITD: 29.32 ng/mL — ABNORMAL LOW (ref 30.00–100.00)

## 2024-03-01 NOTE — Progress Notes (Signed)
 Labs look good except 1.  D just below normal.  Suggest taking 2000iu/d otc 2.  B12 is really low-does she want to do injections or take otc daily?

## 2024-03-03 ENCOUNTER — Ambulatory Visit: Admitting: Professional Counselor

## 2024-03-16 ENCOUNTER — Ambulatory Visit: Admitting: Professional Counselor

## 2024-03-30 ENCOUNTER — Ambulatory Visit: Admitting: Family Medicine

## 2024-04-04 ENCOUNTER — Ambulatory Visit: Admitting: Family Medicine

## 2024-04-05 ENCOUNTER — Ambulatory Visit: Admitting: Family Medicine

## 2024-04-19 ENCOUNTER — Encounter (HOSPITAL_COMMUNITY): Payer: Self-pay

## 2024-04-19 ENCOUNTER — Emergency Department (HOSPITAL_COMMUNITY)
Admission: EM | Admit: 2024-04-19 | Discharge: 2024-04-19 | Disposition: A | Discharge status: Home / Self Care | Attending: Emergency Medicine | Admitting: Emergency Medicine

## 2024-04-19 ENCOUNTER — Other Ambulatory Visit: Payer: Self-pay

## 2024-04-19 DIAGNOSIS — H6501 Acute serous otitis media, right ear: Secondary | ICD-10-CM | POA: Insufficient documentation

## 2024-04-19 DIAGNOSIS — H9201 Otalgia, right ear: Secondary | ICD-10-CM | POA: Diagnosis present

## 2024-04-19 NOTE — Discharge Instructions (Addendum)
 Continue using Zyrtec.  Start using your Nasacort .  Follow-up with your primary care doctor if your symptoms are not improving.  Return to the emergency room if you have any worsening symptoms.

## 2024-04-19 NOTE — ED Provider Notes (Signed)
 [place] EMERGENCY DEPARTMENT AT [facility] Provider Note   CSN: [id number] Arrival date & time: 04/19/24  0453     Patient presents with: Otalgia   Susan Bartlett is a 58 y.o. female.   Patient is a 58 year old female who presents with some discomfort in her right ear.  She states this started about a week ago.  She does have a history of TMJ but has not typically had pain in her ear like this.  She says it radiates down to her jaw and her neck.  She denies any hearing change.  She has some ongoing allergies but takes Zyrtec for this and does not report any change to her allergies.  No fevers.  No drainage from the ear.       Prior to Admission medications   Medication Sig Start Date End Date Taking? Authorizing Provider  albuterol  (VENTOLIN  HFA) 108 (90 Base) MCG/ACT inhaler INHALE 2 PUFFS BY MOUTH EVERY 6 HOURS AS NEEDED FOR WHEEZING OR SHORTNESS OF BREATH 04/25/22   Kassie Acquanetta Bradley, MD  Cholecalciferol (VITAMIN D3) 1.25 MG (50000 UT) CAPS Take 1 capsule by mouth once a week 05/03/23   Cleotilde Ronal RAMAN, MD  clonazePAM  (KLONOPIN ) 1 MG tablet TAKE 1/2 TABLET BY MOUTH TWICE DAILY AND TAKE 1 TABLET BY MOUTH AT NIGHT AS NEEDED FOR ANXIETY 02/24/24   Cottle, Lorene KANDICE Raddle., MD  clotrimazole -betamethasone  (LOTRISONE ) cream Apply 1 application. topically 2 (two) times daily. 12/18/21   Jeanine Knee, NP  gabapentin  (NEURONTIN ) 100 MG capsule TAKE 1 TO 3 CAPSULES BY MOUTH AS NEEDED FOR  RESTLESS  LEGS 05/12/22   Cottle, Lorene KANDICE Raddle., MD  levothyroxine  (SYNTHROID ) 150 MCG tablet Take  1 tablet  Daily  on an empty stomach with only water for 30 minutes & no Antacid meds, Calcium  or Magnesium for 4 hours & avoid Biotin       /       TAKE      BY       MOUTH 02/09/24   Webb, Padonda B, FNP  mometasone (NASONEX) 50 MCG/ACT nasal spray Place 2 sprays into the nose daily.    [provider]  Omega-3 Fatty Acids (FISH OIL) 1200 MG CAPS Take by mouth daily.    [provider]   Phenylephrine-APAP-guaiFENesin (TYLENOL SINUS SEVERE PO) Take by mouth.    [provider]  rosuvastatin  (CRESTOR ) 20 MG tablet Take 1 tab daily in the evening for cholesterol to help reduce heart attack/stroke risk. 04/14/23   Cranford, Tonya, NP  Semaglutide -Weight Management (WEGOVY ) 0.25 MG/0.5ML SOAJ Inject 0.25 mg into the skin once a week. 05/19/23   Cranford, Tonya, NP  sertraline  (ZOLOFT ) 100 MG tablet 1 tablet in the morning and 2 tablets at night 02/24/24   Cottle, Carey G Jr., MD  sodium chloride  (OCEAN) 0.65 % SOLN nasal spray Place 1 spray into both nostrils as needed for congestion.    [provider]  triamcinolone  (NASACORT ) 55 MCG/ACT AERO nasal inhaler Place 2 sprays into the nose daily.    [provider]  varenicline  (CHANTIX ) 0.5 MG tablet Take 1 tablet (0.5 mg total) by mouth 2 (two) times daily. 11/04/22   Cranford, Tonya, NP    Allergies: Patient has no known allergies.    Review of Systems  Constitutional:  Negative for fever.  HENT:  Positive for ear pain and postnasal drip. Negative for congestion, facial swelling, trouble swallowing and voice change.   Respiratory:  Negative  for cough.   Gastrointestinal:  Negative for nausea and vomiting.  Skin:  Negative for rash.  Neurological:  Negative for headaches.    Updated Vital Signs BP (!) 138/92 (BP Location: Right Arm)   Pulse 84   Temp 97.7 F (36.5 C)   Resp 16   Ht 5' 8 (1.727 m)   Wt 92.5 kg   LMP 08/04/2012   SpO2 97%   BMI 31.02 kg/m   Physical Exam HENT:     Head: Normocephalic and atraumatic.     Left Ear: Tympanic membrane normal.     Ears:     Comments: Right TM appears normal.  There is some clear fluid behind the TM.  No lymphadenopathy.  Oropharynx is clear without erythema or exudates.  No elevation of the tongue.  Uvula is midline.  No palpable masses.  No pain over the mastoid.  No external rashes. Cardiovascular:     Rate and Rhythm: Normal rate.  Pulmonary:      Effort: Pulmonary effort is normal.  Musculoskeletal:     Cervical back: Normal range of motion and neck supple.  Skin:    General: Skin is warm and dry.  Neurological:     Mental Status: She is alert and oriented to person, place, and time.     (all labs ordered are listed, but only abnormal results are displayed) Labs Reviewed - No data to display  EKG: None  Radiology: No results found.   Procedures   Medications Ordered in the ED - No data to display                                  Medical Decision Making Problems Addressed: Non-recurrent acute serous otitis media of right ear: acute illness or injury  Amount and/or Complexity of Data Reviewed External Data Reviewed: notes.    Details: Prior notes, medications  Risk OTC drugs. Decision regarding hospitalization.   Patient presents with discomfort in her right ear.  Differential diagnosis: Otitis media, otitis externa, serous effusion, dental pain/abscess, TMJ, strep throat, mastoiditis, shingles, neck mass  Patient is overall well-appearing.  There is some clear fluid behind the right TM.  Suspect this is the etiology of her symptoms.  I do not see any other concerning findings.  No evidence of mastoiditis.  No evidence of infection.  No facial swelling or palpable masses.  Oropharynx is clear without clinical concerns for strep throat or deep tissue infection.  Patient will continue her Zyrtec.  Will add Flonase .  Will follow-up with her PCP if her symptoms are not improving.  Return precautions were given.     Final diagnoses:  Non-recurrent acute serous otitis media of right ear    ED Discharge Orders     None          Lenor Hollering, MD 04/19/24 [phone]

## 2024-04-19 NOTE — ED Triage Notes (Signed)
 Pt c/o right sided ear pain for past few days. Pt states she took ibuprofen  w/o relief. Pt states she has not been getting a lot of rest as her son is here as a patient.

## 2024-04-26 ENCOUNTER — Ambulatory Visit: Admitting: Family Medicine

## 2024-05-02 ENCOUNTER — Other Ambulatory Visit: Payer: Self-pay | Admitting: Psychiatry

## 2024-05-02 DIAGNOSIS — F4001 Agoraphobia with panic disorder: Secondary | ICD-10-CM

## 2024-05-02 DIAGNOSIS — F411 Generalized anxiety disorder: Secondary | ICD-10-CM

## 2024-05-05 ENCOUNTER — Ambulatory Visit: Payer: Medicare Other | Admitting: Nurse Practitioner

## 2024-05-07 ENCOUNTER — Other Ambulatory Visit (HOSPITAL_BASED_OUTPATIENT_CLINIC_OR_DEPARTMENT_OTHER): Payer: Self-pay | Admitting: Obstetrics & Gynecology

## 2024-05-08 ENCOUNTER — Other Ambulatory Visit: Payer: Self-pay | Admitting: Psychiatry

## 2024-05-08 DIAGNOSIS — F422 Mixed obsessional thoughts and acts: Secondary | ICD-10-CM

## 2024-05-08 DIAGNOSIS — F411 Generalized anxiety disorder: Secondary | ICD-10-CM

## 2024-05-08 DIAGNOSIS — F4001 Agoraphobia with panic disorder: Secondary | ICD-10-CM

## 2024-05-10 ENCOUNTER — Ambulatory Visit (INDEPENDENT_AMBULATORY_CARE_PROVIDER_SITE_OTHER): Admitting: Family Medicine

## 2024-05-10 ENCOUNTER — Encounter: Payer: Self-pay | Admitting: Family Medicine

## 2024-05-10 VITALS — BP 127/85 | HR 71 | Temp 98.0°F | Resp 18 | Ht 68.0 in | Wt 216.2 lb

## 2024-05-10 DIAGNOSIS — R29898 Other symptoms and signs involving the musculoskeletal system: Secondary | ICD-10-CM

## 2024-05-10 DIAGNOSIS — E66811 Obesity, class 1: Secondary | ICD-10-CM

## 2024-05-10 DIAGNOSIS — E6609 Other obesity due to excess calories: Secondary | ICD-10-CM

## 2024-05-10 DIAGNOSIS — E034 Atrophy of thyroid (acquired): Secondary | ICD-10-CM

## 2024-05-10 DIAGNOSIS — Z6832 Body mass index (BMI) 32.0-32.9, adult: Secondary | ICD-10-CM

## 2024-05-10 DIAGNOSIS — Z720 Tobacco use: Secondary | ICD-10-CM

## 2024-05-10 DIAGNOSIS — E039 Hypothyroidism, unspecified: Secondary | ICD-10-CM

## 2024-05-10 DIAGNOSIS — F325 Major depressive disorder, single episode, in full remission: Secondary | ICD-10-CM

## 2024-05-10 DIAGNOSIS — E538 Deficiency of other specified B group vitamins: Secondary | ICD-10-CM

## 2024-05-10 DIAGNOSIS — R0981 Nasal congestion: Secondary | ICD-10-CM

## 2024-05-10 MED ORDER — LEVOTHYROXINE SODIUM 150 MCG PO TABS
ORAL_TABLET | ORAL | 0 refills | Status: DC
Start: 2024-05-10 — End: 2024-07-22

## 2024-05-10 MED ORDER — VARENICLINE TARTRATE 0.5 MG PO TABS: 0.5000 mg | ORAL_TABLET | Freq: Two times a day (BID) | ORAL | 1 refills | Status: AC

## 2024-05-10 NOTE — Patient Instructions (Addendum)
 Magnesium 400-500mg  in eve.   Oxide or glycinate.   Retry the B12 at different time than D  Hold the rosuvastatin  for 2 wks-and see if legs improve, then restart  Sent chantix   Let me know on sinuses end of the week  Chantix  1/day for 1 week then 2x/day

## 2024-05-10 NOTE — Progress Notes (Signed)
 Subjective:     Patient ID: Susan Bartlett, female    DOB: 11-01-1965, 59 y.o.   MRN: [id number]  Chief Complaint  Patient presents with   New Med Request    Discuss getting Ozempic again   Headache    Started a couple of days ago, possible sinus headache, got attacked by bees last Wednesday, had a headache since them    HPI D,b12, rls Discussed the use of AI scribe software for clinical note transcription with the patient, who gave verbal consent to proceed.  History of Present Illness Susan Bartlett is a 58 year old female who presents for medication management and evaluation of weight gain.  She is currently on disability due to panic attacks, obsessive thoughts, and depression. She feels heavily medicated but stable with her psychiatric condition. She has been under psychiatric care for approximately 25 years. No SI  She has experienced weight gain, increasing from 206.8 lbs to approximately 217-219 lbs over the past four weeks. She attributes this to a missed dosage increase of semaglutide  and difficulty communicating with her provider([facility]). She is concerned about her weight gain and the impact of her son's recent hospitalization on her ability to manage her weight.  She is considering starting Chantix  to quit smoking, as she currently smokes a pack to a pack and a half per day. She previously quit for three to four months but has resumed smoking. Had no problems w/it in past  Her current medications include albuterol  as needed, vitamin D , clonazepam , Lotrisone  cream as needed, thyroid  medication (150 mcg daily), Nasonex nasal spray, omega-3 supplements, Tylenol sinus, rosuvastatin  (20 mg), and sertraline  (200 mg daily). She no longer takes gabapentin  due to adverse effects and takes naltrexone 4.5 mg from a compound pharmacy.  She experiences sinus congestion, headaches, and throat pain, particularly on the right side, which began after her son's hospitalization in early July.  She was evaluated in the emergency room for ear pain and was advised to use Nasacort  and Zyrtec(which she was already using). The ear pain has resolved, but congestion and headaches persist. No f/c.  Slight cough.   She describes leg weakness and heaviness, particularly after sitting and then standing. Previously diagnosed with restless leg syndrome while on gabapentin , her symptoms have changed since discontinuing the medication. She experiences leg jerks during sleep, sometimes causing back pain. She is considering magnesium supplementation to address these symptoms.  She has a history of bee stings while pressure washing at home, resulting in multiple stings on her arms and torso. She uses Lotrisone  cream for the stings.  She reports red spots on her legs, which are flat, non-itchy, and resolve within a few days. These have occurred three times, typically after being outside in the heat.    Health Maintenance Due  Topic Date Due   Lung Cancer Screening  01/07/2023   Medicare Annual Wellness (AWV)  05/18/2024    Past Medical History:  Diagnosis Date   Anal fissure 1994   Anxiety    Arthritis    Dr. Arnaldo at [facility]   Depression    Endometriosis    Fracture 08/2011   fracture left wrist and elbow   Hemorrhoids 2008   Hepatomegaly    Hypothyroidism    Mixed hyperlipidemia    Obesity    OCD (obsessive compulsive disorder)    Panic disorder    Plantar fasciitis, right 03/2014   Prediabetes 01/16/2015   RLS (restless legs syndrome) 2014  vis sleep study- no sleep apnea   Shingles 07/2017   Tobacco abuse    not tolerated Chantix  in the past   Vitamin D  deficiency     Past Surgical History:  Procedure Laterality Date   CHOLECYSTECTOMY     HEMORROIDECTOMY     LEFT OOPHORECTOMY     TONSILLECTOMY       Current Outpatient Medications:    albuterol  (VENTOLIN  HFA) 108 (90 Base) MCG/ACT inhaler, INHALE 2 PUFFS BY MOUTH EVERY 6 HOURS AS NEEDED FOR WHEEZING OR  SHORTNESS OF BREATH, Disp: 18 g, Rfl: 2   Cholecalciferol (VITAMIN D3) 1.25 MG (50000 UT) CAPS, Take 1 capsule by mouth once a week, Disp: 12 capsule, Rfl: 0   clonazePAM  (KLONOPIN ) 1 MG tablet, TAKE 1/2 TABLET BY MOUTH TWICE DAILY AND TAKE 1 TABLET BY MOUTH AT NIGHT AS NEEDED FOR ANXIETY, Disp: 60 tablet, Rfl: 1   clotrimazole -betamethasone  (LOTRISONE ) cream, Apply 1 application. topically 2 (two) times daily., Disp: 15 g, Rfl: 2   mometasone (NASONEX) 50 MCG/ACT nasal spray, Place 2 sprays into the nose daily., Disp: , Rfl:    Naltrexone HCl, Pain, 4.5 MG CAPS, Take by mouth., Disp: , Rfl:    Omega-3 Fatty Acids (FISH OIL) 1200 MG CAPS, Take by mouth daily., Disp: , Rfl:    Phenylephrine-APAP-guaiFENesin (TYLENOL SINUS SEVERE PO), Take by mouth., Disp: , Rfl:    rosuvastatin  (CRESTOR ) 20 MG tablet, Take 1 tab daily in the evening for cholesterol to help reduce heart attack/stroke risk., Disp: 90 tablet, Rfl: 3   Semaglutide -Weight Management (WEGOVY ) 0.25 MG/0.5ML SOAJ, Inject 0.25 mg into the skin once a week., Disp: 2 mL, Rfl: 2   sertraline  (ZOLOFT ) 100 MG tablet, TAKE 2 & 1/2 (TWO & ONE-HALF) TABLETS BY MOUTH ONCE DAILY, Disp: 75 tablet, Rfl: 0   sodium chloride  (OCEAN) 0.65 % SOLN nasal spray, Place 1 spray into both nostrils as needed for congestion., Disp: , Rfl:    triamcinolone  (NASACORT ) 55 MCG/ACT AERO nasal inhaler, Place 2 sprays into the nose daily., Disp: , Rfl:    triamcinolone  cream (KENALOG ) 0.5 %, Apply topically 2 (two) times daily as needed., Disp: , Rfl:    levothyroxine  (SYNTHROID ) 150 MCG tablet, Take  1 tablet  Daily  on an empty stomach with only water for 30 minutes & no Antacid meds, Calcium  or Magnesium for 4 hours & avoid Biotin       /       TAKE      BY       MOUTH, Disp: 90 tablet, Rfl: 0   varenicline  (CHANTIX ) 0.5 MG tablet, Take 1 tablet (0.5 mg total) by mouth 2 (two) times daily., Disp: 60 tablet, Rfl: 1  No Known Allergies ROS neg/noncontributory except as  noted HPI/below      Objective:     BP 127/85   Pulse 71   Temp 98 F (36.7 C) (Temporal)   Resp 18   Ht 5' 8 (1.727 m)   Wt 216 lb 4 oz (98.1 kg)   LMP 08/04/2012   SpO2 96%   BMI 32.88 kg/m  Wt Readings from Last 3 Encounters:  05/10/24 216 lb 4 oz (98.1 kg)  04/19/24 204 lb (92.5 kg)  02/26/24 208 lb 9.6 oz (94.6 kg)    Physical Exam   Gen: WDWN NAD HEENT: NCAT, conjunctiva not injected, sclera nonicteric TM WNL B, OP moist, no exudates.  No TTP sinuses NECK:  supple, no thyromegaly, no nodes, no  carotid bruits CARDIAC: RRR, S1S2+, no murmur. DP 2+B LUNGS: CTAB. No wheezes ABDOMEN:  BS+, soft, NTND, No HSM, no masses EXT:  no edema MSK: no gross abnormalities.  NEURO: A&O x3.  CN II-XII intact.  PSYCH: normal mood. Good eye contact     Assessment & Plan:  Hypothyroidism due to acquired atrophy of thyroid  -     Levothyroxine  Sodium; Take  1 tablet  Daily  on an empty stomach with only water for 30 minutes & no Antacid meds, Calcium  or Magnesium for 4 hours & avoid Biotin       /       TAKE      BY       MOUTH  Dispense: 90 tablet; Refill: 0  Hypothyroidism, unspecified type -     Levothyroxine  Sodium; Take  1 tablet  Daily  on an empty stomach with only water for 30 minutes & no Antacid meds, Calcium  or Magnesium for 4 hours & avoid Biotin       /       TAKE      BY       MOUTH  Dispense: 90 tablet; Refill: 0  Tobacco abuse -     Varenicline  Tartrate; Take 1 tablet (0.5 mg total) by mouth 2 (two) times daily.  Dispense: 60 tablet; Refill: 1  Class 1 obesity due to excess calories with serious comorbidity and body mass index (BMI) of 32.0 to 32.9 in adult  Weakness of both lower extremities  B12 deficiency  Depression, major, in remission (HCC)  Sinus congestion  Assessment and Plan Assessment & Plan Tobacco use disorder   She has resumed smoking, currently at 1-1.5 packs per day, after previously quitting for 3-4 months. She is aware of the increased  cardiovascular risk. Discussed potential side effects of Chantix , including vivid dreams and suicidal thoughts. Prescribe Chantix , starting with one per day for one week, then increase to twice a day. Advise stopping Chantix  if experiencing intrusive suicidal thoughts. Plan to wait two weeks before starting Chantix  to avoid confounding with other medication changes.  Obesity   Her weight has increased to 217-219 lbs from 206.8 lbs. Previous use of semaglutide  (Wegovy ) was effective but discontinued due to access issues, as insurance does not cover it without a history of stents, angioplasty, or heart attack. Encourage resumption of semaglutide  through [facility] despite logistical challenges and discuss potential mail-order options. Encourage self-motivation to attend appointments for semaglutide  administration.  Generalized anxiety disorder and major depressive disorder   These are well-managed with current psychiatric management under Dr. Lorene Macintosh for 25 years. Current medications include clonazepam  and sertraline . A previous trial of Wellbutrin  was discontinued due to increased anxiety and panic attacks. Continue current psychiatric medications as prescribed and consult with Dr. Macintosh regarding potential interactions with Chantix .  Restless legs syndrome with lower extremity weakness and nocturnal leg jerks   She experiences nocturnal leg jerks and weakness in her legs, especially after sitting. Previously managed with gabapentin , which is no longer taken. Symptoms may be related to restless legs syndrome or other causes such as magnesium deficiency or medication side effects. Recommend magnesium supplementation, 400-500 mg in the evening, and advise stretching, walking, and hydration. Hold rosuvastatin  for two weeks to assess for improvement in leg symptoms. Consider referral to a podiatrist for foot issues.  Chronic rhinosinusitis with nasal congestion, headache, and intermittent sore throat    She has persistent nasal congestion, frontal headache, and sore throat, especially on  the right side, ongoing since early July. Previous evaluation showed fluid in the ear but no infection. Current management includes nasal sprays and antihistamines. Continue using Nasacort  and Zyrtec, consider neti pot for nasal irrigation, and monitor symptoms. Consider antibiotics if no improvement.  Hypothyroidism   This is well-managed on the current dose of levothyroxine  (Synthroid ) 150 mcg daily. Renew prescription for levothyroxine .  Hyperlipidemia   She is currently on rosuvastatin , taken three times a week, with concerns about potential side effects contributing to leg symptoms. Hold rosuvastatin  for two weeks to assess for improvement in leg symptoms.  Plantar fasciitis and foot discomfort   She experiences foot discomfort, including calluses and difficulty with barefoot shoes. Consider referral to a podiatrist for further evaluation and management.  B12 deficiency-discussed importance of taking it    Return in about 3 months (around 08/10/2024) for chronic follow-up.  Jenkins CHRISTELLA Carrel, MD

## 2024-05-11 ENCOUNTER — Encounter: Payer: Self-pay | Admitting: Family Medicine

## 2024-05-11 ENCOUNTER — Other Ambulatory Visit (HOSPITAL_BASED_OUTPATIENT_CLINIC_OR_DEPARTMENT_OTHER): Payer: Self-pay | Admitting: Obstetrics & Gynecology

## 2024-05-11 DIAGNOSIS — Z1231 Encounter for screening mammogram for malignant neoplasm of breast: Secondary | ICD-10-CM

## 2024-05-13 ENCOUNTER — Telehealth: Payer: Self-pay | Admitting: Psychiatry

## 2024-05-13 NOTE — Telephone Encounter (Signed)
 Pt was prescribed Varenicline  0.5mg   and wants to make sure that ok to take. And also is taking Wegovy . She just wants to make sure its ok she takes these with her psych meds. Wondering if it will  make her more anxious

## 2024-05-15 NOTE — Telephone Encounter (Signed)
 Please see message.

## 2024-05-16 NOTE — Telephone Encounter (Signed)
 Varenicline  is very helpful for smoking so she should try it.  The most common SE are Nausea and bad dreams but of course those don't always occur.  No problems with psych meds.

## 2024-05-16 NOTE — Telephone Encounter (Signed)
 Patient notified

## 2024-05-19 ENCOUNTER — Ambulatory Visit: Payer: Medicare Other | Admitting: Nurse Practitioner

## 2024-05-24 ENCOUNTER — Telehealth: Admitting: Psychiatry

## 2024-05-24 ENCOUNTER — Encounter: Payer: Self-pay | Admitting: Psychiatry

## 2024-05-24 DIAGNOSIS — F4001 Agoraphobia with panic disorder: Secondary | ICD-10-CM

## 2024-05-24 DIAGNOSIS — R7989 Other specified abnormal findings of blood chemistry: Secondary | ICD-10-CM

## 2024-05-24 DIAGNOSIS — F5105 Insomnia due to other mental disorder: Secondary | ICD-10-CM

## 2024-05-24 DIAGNOSIS — F409 Phobic anxiety disorder, unspecified: Secondary | ICD-10-CM

## 2024-05-24 DIAGNOSIS — F330 Major depressive disorder, recurrent, mild: Secondary | ICD-10-CM | POA: Diagnosis not present

## 2024-05-24 DIAGNOSIS — F422 Mixed obsessional thoughts and acts: Secondary | ICD-10-CM

## 2024-05-24 DIAGNOSIS — F411 Generalized anxiety disorder: Secondary | ICD-10-CM

## 2024-05-24 DIAGNOSIS — G2581 Restless legs syndrome: Secondary | ICD-10-CM

## 2024-05-24 MED ORDER — SERTRALINE HCL 100 MG PO TABS: ORAL_TABLET | ORAL | 2 refills | Status: DC
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
## 2024-06-07 ENCOUNTER — Other Ambulatory Visit: Payer: Self-pay | Admitting: Psychiatry

## 2024-06-07 DIAGNOSIS — F422 Mixed obsessional thoughts and acts: Secondary | ICD-10-CM

## 2024-06-07 DIAGNOSIS — F411 Generalized anxiety disorder: Secondary | ICD-10-CM

## 2024-06-07 DIAGNOSIS — F4001 Agoraphobia with panic disorder: Secondary | ICD-10-CM

## 2024-06-08 ENCOUNTER — Telehealth (INDEPENDENT_AMBULATORY_CARE_PROVIDER_SITE_OTHER): Admitting: Family Medicine

## 2024-06-08 ENCOUNTER — Encounter: Payer: Self-pay | Admitting: Family Medicine

## 2024-06-08 DIAGNOSIS — E559 Vitamin D deficiency, unspecified: Secondary | ICD-10-CM | POA: Diagnosis not present

## 2024-06-08 DIAGNOSIS — E538 Deficiency of other specified B group vitamins: Secondary | ICD-10-CM

## 2024-06-08 DIAGNOSIS — E782 Mixed hyperlipidemia: Secondary | ICD-10-CM | POA: Diagnosis not present

## 2024-06-08 DIAGNOSIS — G2581 Restless legs syndrome: Secondary | ICD-10-CM

## 2024-06-08 MED ORDER — ATORVASTATIN CALCIUM 20 MG PO TABS: 20.0000 mg | ORAL_TABLET | Freq: Every day | ORAL | 3 refills | Status: AC

## 2024-06-08 NOTE — Patient Instructions (Addendum)
 It was very nice to see you today!  Take coQ10 100mg  daily.   Add the atorvastatin  Vitamin D  1000iu/day   PLEASE NOTE:  If you had any lab tests please let us  know if you have not heard back within a few days. You may see your results on MyChart before we have a chance to review them but we will give you a call once they are reviewed by us . If we ordered any referrals today, please let us  know if you have not heard from their office within the next week.   Please try these tips to maintain a healthy lifestyle:  Eat most of your calories during the day when you are active. Eliminate processed foods including packaged sweets (pies, cakes, cookies), reduce intake of potatoes, white bread, white pasta, and white rice. Look for whole grain options, oat flour or almond flour.  Each meal should contain half fruits/vegetables, one quarter protein, and one quarter carbs (no bigger than a computer mouse).  Cut down on sweet beverages. This includes juice, soda, and sweet tea. Also watch fruit intake, though this is a healthier sweet option, it still contains natural sugar! Limit to 3 servings daily.  Drink at least 1 glass of water with each meal and aim for at least 8 glasses per day  Exercise at least 150 minutes every week.

## 2024-06-08 NOTE — Progress Notes (Signed)
 MyChart Video Visit Virtual Visit via Video Note   This visit type was conducted w/patient consent. This format is felt to be most appropriate for this patient at this time. Physical exam was limited by quality of the video and audio technology used for the visit. CMA was able to get the patient set up on a video visit.  Patient location: Home. Patient and provider in visit Provider location: Office  I discussed the limitations of evaluation and management by telemedicine and the availability of in person appointments. The patient expressed understanding and agreed to proceed.  Visit Date: 06/08/2024  Today's healthcare provider: Jenkins CHRISTELLA Carrel, MD     Subjective:    Patient ID: Susan Bartlett, female    DOB: 07-25-1966, 57 y.o.   MRN: [id number]  Chief Complaint  Patient presents with   Follow-up    Follow-up on medication, stopped taking rosuvastatin  2 weeks ago    HPI Discussed the use of AI scribe software for clinical note transcription with the patient, who gave verbal consent to proceed.  History of Present Illness Susan Bartlett is a 58 year old female with coronary artery disease who presents for management of cholesterol medication and restless leg syndrome.  She has a history of restless leg syndrome, which worsened with rosuvastatin  use. After discontinuing rosuvastatin , her symptoms improved significantly, though mild symptoms persist. Previously, her legs would jerk involuntarily, but this has now reduced in severity.  She reports that a scan of her heart showed plaque buildup in her arteries, including the LAD and circumflex, with a calcium  score of 0.36(70%). She has not experienced any heart attacks or strokes. She was previously on pravastatin , which she tolerated well, but was switched to rosuvastatin  to improve cholesterol levels. She is currently taking fish oil for triglycerides.  She experiences significant anxiety and is concerned about potential side effects of  medications exacerbating this condition. She is currently on sertraline , which was recently increased by her psychiatrist, and is awaiting to start Chantix  for smoking cessation.  She has a history of vitamin D  deficiency, with a current level of 29. She has vitamin D  supplements at home but often forgets to take them. She also reports a history of low vitamin B12 levels, which previously caused nausea when supplemented. She has both sublingual and oral B12 supplements at home but the oral, caused GI upset.  She inquires about magnesium supplementation due to previous cramps, which have since resolved. She has both magnesium glycinate and oxide at home and uses them interchangeably.      Past Medical History:  Diagnosis Date   Anal fissure 1994   Anxiety    Arthritis    Dr. Arnaldo at [facility]   Depression    Endometriosis    Fracture 08/2011   fracture left wrist and elbow   Hemorrhoids 2008   Hepatomegaly    Hypothyroidism    Mixed hyperlipidemia    Obesity    OCD (obsessive compulsive disorder)    Panic disorder    Plantar fasciitis, right 03/2014   Prediabetes 01/16/2015   RLS (restless legs syndrome) 2014   vis sleep study- no sleep apnea   Shingles 07/2017   Tobacco abuse    not tolerated Chantix  in the past   Vitamin D  deficiency     Past Surgical History:  Procedure Laterality Date   CHOLECYSTECTOMY     HEMORROIDECTOMY     LEFT OOPHORECTOMY     TONSILLECTOMY  Outpatient Medications Prior to Visit  Medication Sig Dispense Refill   albuterol  (VENTOLIN  HFA) 108 (90 Base) MCG/ACT inhaler INHALE 2 PUFFS BY MOUTH EVERY 6 HOURS AS NEEDED FOR WHEEZING OR SHORTNESS OF BREATH 18 g 2   Cholecalciferol (VITAMIN D3) 1.25 MG (50000 UT) CAPS Take 1 capsule by mouth once a week 12 capsule 0   clonazePAM  (KLONOPIN ) 1 MG tablet TAKE 1/2 TABLET BY MOUTH TWICE DAILY AND TAKE 1 TABLET BY MOUTH AT NIGHT AS NEEDED FOR ANXIETY 60 tablet 1   clotrimazole -betamethasone   (LOTRISONE ) cream Apply 1 application. topically 2 (two) times daily. 15 g 2   levothyroxine  (SYNTHROID ) 150 MCG tablet Take  1 tablet  Daily  on an empty stomach with only water for 30 minutes & no Antacid meds, Calcium  or Magnesium for 4 hours & avoid Biotin       /       TAKE      BY       MOUTH 90 tablet 0   mometasone (NASONEX) 50 MCG/ACT nasal spray Place 2 sprays into the nose daily.     Naltrexone HCl, Pain, 4.5 MG CAPS Take by mouth.     Omega-3 Fatty Acids (FISH OIL) 1200 MG CAPS Take by mouth daily.     Phenylephrine-APAP-guaiFENesin (TYLENOL SINUS SEVERE PO) Take by mouth.     Semaglutide -Weight Management (WEGOVY ) 0.25 MG/0.5ML SOAJ Inject 0.25 mg into the skin once a week. 2 mL 2   sertraline  (ZOLOFT ) 100 MG tablet TAKE 2 TABLETS IN THE MORNING AND 1 TABLET AT NIGHT (Patient taking differently: TAKE 1 TABLET IN THE MORNING AND 2 TABLETS AT NIGHT) 90 tablet 2   sodium chloride  (OCEAN) 0.65 % SOLN nasal spray Place 1 spray into both nostrils as needed for congestion.     triamcinolone  (NASACORT ) 55 MCG/ACT AERO nasal inhaler Place 2 sprays into the nose daily.     triamcinolone  cream (KENALOG ) 0.5 % Apply topically 2 (two) times daily as needed.     varenicline  (CHANTIX ) 0.5 MG tablet Take 1 tablet (0.5 mg total) by mouth 2 (two) times daily. 60 tablet 1   rosuvastatin  (CRESTOR ) 20 MG tablet Take 1 tab daily in the evening for cholesterol to help reduce heart attack/stroke risk. (Patient not taking: Reported on 06/08/2024) 90 tablet 3   No facility-administered medications prior to visit.    No Known Allergies      Objective:     Physical Exam  Vitals and nursing note reviewed.  Constitutional:      General:  is not in acute distress.    Appearance: Normal appearance.  HENT:     Head: Normocephalic.  Pulmonary:     Effort: No respiratory distress.  Skin:    General: Skin is dry.     Coloration: Skin is not pale.  Neurological:     Mental Status: Pt is alert and  oriented to person, place, and time.  Psychiatric:        Mood and Affect: Mood normal.  Discussed labs LMP 08/04/2012   Wt Readings from Last 3 Encounters:  05/10/24 216 lb 4 oz (98.1 kg)  04/19/24 204 lb (92.5 kg)  02/26/24 208 lb 9.6 oz (94.6 kg)       Assessment & Plan:   Problem List Items Addressed This Visit     B12 deficiency   Mixed hyperlipidemia - Primary   Relevant Medications   atorvastatin  (LIPITOR) 20 MG tablet   RLS (restless legs syndrome)  Vitamin D  deficiency    Meds ordered this encounter  Medications   atorvastatin  (LIPITOR) 20 MG tablet    Sig: Take 1 tablet (20 mg total) by mouth daily.    Dispense:  90 tablet    Refill:  3  Assessment and Plan Assessment & Plan Coronary artery disease with coronary artery plaque  and HLD Coronary artery disease with mixed plaque in the LAD and plaque in the circumflex requires cholesterol management to prevent myocardial infarction. Previous treatments with rosuvastatin  caused significant side effects, and pravastatin  was ineffective. Start atorvastatin  20mg at night. Advise taking CoQ10 100 mg daily to mitigate potential myalgia. Discontinue atorvastatin  if previous symptoms recur and notify the office. Encourage smoking cessation to protect cardiovascular health.  Restless legs syndrome   Restless legs syndrome was previously exacerbated by rosuvastatin . Symptoms have improved since discontinuation and are manageable with heating pad use.  Vitamin B12 deficiency   Vitamin B12 deficiency with previous oral supplementation caused nausea. Try sublingual B12 supplementation and monitor for nausea. Consider B12 injections if sublingual form is ineffective or not tolerated.pt concerned w/IM(anxious) about large dose at once.  Vitamin D  deficiency   Vitamin D  level is slightly below ideal at 29 ng/mL. Advise taking vitamin D3 1000 IU daily to prevent myalgia and improve overall health.  Tobacco use   Continued tobacco  use increases cardiovascular risk. Psychiatrist plans to initiate Chantix  after stabilizing sertraline  dosage. Encourage smoking cessation.    I discussed the assessment and treatment plan with the patient. The patient was provided an opportunity to ask questions and all were answered. The patient agreed with the plan and demonstrated an understanding of the instructions.   The patient was advised to call back or seek an in-person evaluation if the symptoms worsen or if the condition fails to improve as anticipated.  Return for as sch on 10/29.  Susan CHRISTELLA Carrel, MD [facility] [phone] (phone) [phone] (fax)  [facility]

## 2024-06-27 ENCOUNTER — Ambulatory Visit (HOSPITAL_BASED_OUTPATIENT_CLINIC_OR_DEPARTMENT_OTHER): Admitting: Obstetrics & Gynecology

## 2024-06-27 ENCOUNTER — Ambulatory Visit (HOSPITAL_BASED_OUTPATIENT_CLINIC_OR_DEPARTMENT_OTHER): Admitting: Radiology

## 2024-07-19 ENCOUNTER — Encounter: Payer: Self-pay | Admitting: Family Medicine

## 2024-07-20 ENCOUNTER — Other Ambulatory Visit: Payer: Self-pay

## 2024-07-22 ENCOUNTER — Telehealth: Payer: Self-pay

## 2024-07-22 ENCOUNTER — Other Ambulatory Visit: Payer: Self-pay | Admitting: Family Medicine

## 2024-07-22 DIAGNOSIS — E034 Atrophy of thyroid (acquired): Secondary | ICD-10-CM

## 2024-07-22 DIAGNOSIS — E039 Hypothyroidism, unspecified: Secondary | ICD-10-CM

## 2024-07-22 MED ORDER — LEVOTHYROXINE SODIUM 150 MCG PO TABS: ORAL_TABLET | ORAL | 0 refills | Status: AC

## 2024-07-22 NOTE — Telephone Encounter (Signed)
 Copied from CRM [id number]. Topic: General - Call Back - No Documentation >> Jul 22, 2024 10:45 AM Nessti S wrote: Reason for CRM: patient returning call from [facility]. Callback [phone]  Please see pt return call for [facility]

## 2024-07-22 NOTE — Telephone Encounter (Signed)
 Copied from CRM [id number]. Topic: Clinical - Medication Refill >> Jul 22, 2024 10:50 AM Nessti S wrote: Medication: levothyroxine  (SYNTHROID ) 150 MCG tablet  Has the patient contacted their pharmacy? Yes  This is the patient's preferred pharmacy:  [facility] [address] [place], [place] - [address] [place] [address] Phone: [phone] Fax: [phone]  Is this the correct pharmacy for this prescription? Yes If no, delete pharmacy and type the correct one.   Has the prescription been filled recently? Yes  Is the patient out of the medication? Yes  Has the patient been seen for an appointment in the last year OR does the patient have an upcoming appointment? Yes  Can we respond through MyChart? Yes  Agent: Please be advised that Rx refills may take up to 3 business days. We ask that you follow-up with your pharmacy.

## 2024-07-22 NOTE — Telephone Encounter (Signed)
 LVM to schedule ov with primary

## 2024-07-25 ENCOUNTER — Other Ambulatory Visit: Payer: Self-pay

## 2024-07-25 DIAGNOSIS — E039 Hypothyroidism, unspecified: Secondary | ICD-10-CM

## 2024-07-25 NOTE — Telephone Encounter (Signed)
 Future TSH has been placed

## 2024-07-26 ENCOUNTER — Ambulatory Visit: Admitting: Family Medicine

## 2024-07-26 ENCOUNTER — Encounter: Payer: Self-pay | Admitting: Family Medicine

## 2024-07-26 VITALS — BP 120/80 | HR 87 | Temp 97.9°F | Ht 68.0 in | Wt 213.0 lb

## 2024-07-26 DIAGNOSIS — E538 Deficiency of other specified B group vitamins: Secondary | ICD-10-CM | POA: Diagnosis not present

## 2024-07-26 DIAGNOSIS — E559 Vitamin D deficiency, unspecified: Secondary | ICD-10-CM

## 2024-07-26 DIAGNOSIS — E063 Autoimmune thyroiditis: Secondary | ICD-10-CM | POA: Diagnosis not present

## 2024-07-26 DIAGNOSIS — Z122 Encounter for screening for malignant neoplasm of respiratory organs: Secondary | ICD-10-CM

## 2024-07-26 DIAGNOSIS — E782 Mixed hyperlipidemia: Secondary | ICD-10-CM

## 2024-07-26 LAB — COMPREHENSIVE METABOLIC PANEL WITH GFR
ALT: 12 U/L (ref 0–35)
AST: 15 U/L (ref 0–37)
Albumin: 4.5 g/dL (ref 3.5–5.2)
Alkaline Phosphatase: 79 U/L (ref 39–117)
BUN: 8 mg/dL (ref 6–23)
CO2: 24 meq/L (ref 19–32)
Calcium: 9.6 mg/dL (ref 8.4–10.5)
Chloride: 101 meq/L (ref 96–112)
Creatinine, Ser: 0.66 mg/dL (ref 0.40–1.20)
GFR: 95.82 mL/min (ref >60.00)
Glucose, Bld: 94 mg/dL (ref 70–99)
Potassium: 3.6 meq/L (ref 3.5–5.1)
Sodium: 133 meq/L — ABNORMAL LOW (ref 135–145)
Total Bilirubin: 0.4 mg/dL (ref 0.2–1.2)
Total Protein: 6.7 g/dL (ref 6.0–8.3)

## 2024-07-26 LAB — CBC WITH DIFFERENTIAL/PLATELET
Basophils Absolute: 0.1 K/uL (ref 0.0–0.1)
Basophils Relative: 0.7 % (ref 0.0–3.0)
Eosinophils Absolute: 0.3 K/uL (ref 0.0–0.7)
Eosinophils Relative: 3.7 % (ref 0.0–5.0)
HCT: 39.5 % (ref 36.0–46.0)
Hemoglobin: 13.5 g/dL (ref 12.0–15.0)
Lymphocytes Relative: 25.1 % (ref 12.0–46.0)
Lymphs Abs: 2.2 K/uL (ref 0.7–4.0)
MCHC: 34.1 g/dL (ref 30.0–36.0)
MCV: 89.9 fl (ref 78.0–100.0)
Monocytes Absolute: 0.4 K/uL (ref 0.1–1.0)
Monocytes Relative: 5 % (ref 3.0–12.0)
Neutro Abs: 5.6 K/uL (ref 1.4–7.7)
Neutrophils Relative %: 65.5 % (ref 43.0–77.0)
Platelets: 230 K/uL (ref 150.0–400.0)
RBC: 4.39 Mil/uL (ref 3.87–5.11)
RDW: 14 % (ref 11.5–15.5)
WBC: 8.6 K/uL (ref 4.0–10.5)

## 2024-07-26 LAB — LIPID PANEL
Cholesterol: 227 mg/dL — ABNORMAL HIGH (ref 0–200)
HDL: 40 mg/dL (ref >39.00)
LDL Cholesterol: 108 mg/dL — ABNORMAL HIGH (ref 0–99)
NonHDL: 186.51
Total CHOL/HDL Ratio: 6
Triglycerides: 393 mg/dL — ABNORMAL HIGH (ref 0.0–149.0)
VLDL: 78.6 mg/dL — ABNORMAL HIGH (ref 0.0–40.0)

## 2024-07-26 LAB — VITAMIN B12: Vitamin B-12: 245 pg/mL (ref 211–911)

## 2024-07-26 LAB — T4, FREE: Free T4: 1.16 ng/dL (ref 0.60–1.60)

## 2024-07-26 LAB — T3, FREE: T3, Free: 2.7 pg/mL (ref 2.3–4.2)

## 2024-07-26 LAB — TSH: TSH: 1.58 u[IU]/mL (ref 0.35–5.50)

## 2024-07-26 LAB — VITAMIN D 25 HYDROXY (VIT D DEFICIENCY, FRACTURES): VITD: 21.61 ng/mL — ABNORMAL LOW (ref 30.00–100.00)

## 2024-07-26 MED ORDER — LEVOTHYROXINE SODIUM 25 MCG PO TABS: 25.0000 ug | ORAL_TABLET | Freq: Every day | ORAL | 0 refills | Status: DC

## 2024-07-26 NOTE — Patient Instructions (Addendum)
 It was very nice to see you today!  Vitamin C 500mg /day   PLEASE NOTE:  If you had any lab tests please let us  know if you have not heard back within a few days. You may see your results on MyChart before we have a chance to review them but we will give you a call once they are reviewed by us . If we ordered any referrals today, please let us  know if you have not heard from their office within the next week.   Please try these tips to maintain a healthy lifestyle:  Eat most of your calories during the day when you are active. Eliminate processed foods including packaged sweets (pies, cakes, cookies), reduce intake of potatoes, white bread, white pasta, and white rice. Look for whole grain options, oat flour or almond flour.  Each meal should contain half fruits/vegetables, one quarter protein, and one quarter carbs (no bigger than a computer mouse).  Cut down on sweet beverages. This includes juice, soda, and sweet tea. Also watch fruit intake, though this is a healthier sweet option, it still contains natural sugar! Limit to 3 servings daily.  Drink at least 1 glass of water with each meal and aim for at least 8 glasses per day  Exercise at least 150 minutes every week.

## 2024-07-26 NOTE — Progress Notes (Signed)
 Subjective:     Patient ID: Susan Bartlett, female    DOB: 12/06/1965, 58 y.o.   MRN: [id number]  Chief Complaint  Patient presents with   Hyperlipidemia    38mo follow up; want to discuss thyroid  and cholesterol levels    HPI Discussed the use of AI scribe software for clinical note transcription with the patient, who gave verbal consent to proceed.  History of Present Illness Susan Bartlett is a 58 year old female with Hashimoto's thyroiditis who presents for evaluation of thyroid  function and medication management.  She has a history of Hashimoto's thyroiditis and is currently taking levothyroxine  150 mcg daily. Recent blood work on September 12 [facility] showed an elevated TSH level of 9, with free T3 at 2.1 and free T4 at 0.85. Her thyroid  medication dose has not been adjusted since the last test. She is concerned about the rapid increase in her thyroid  levels since May, when her TSH was 2.1.  She is taking low-dose naltrexone, which she believes is intended to reduce inflammation related to her Hashimoto's. She also takes B12, vitamin D , and vitamin C supplements, and occasionally zinc when her throat hurts. She has not started her prescribed cholesterol medication due to concerns about medication sensitivity.  She has noticed changes in her fingernails, describing them as growing downwards and to the sides. She is a smoker and is attempting to reduce smoking.  She has a family history of heart disease and is cautious about salt intake due to concerns about blood pressure. She notes that her sodium intake tend to be low.  She has a history of panic disorder and is sensitive to medications, which influences her approach to starting new treatments.  She has a history of Epstein-Barr virus and questions whether her symptoms could be related to mold exposure in her home, which has since been remediated.    Health Maintenance Due  Topic Date Due   COVID-19 Vaccine (1) Never done    Lung Cancer Screening  01/07/2023   Medicare Annual Wellness (AWV)  05/18/2024   Colonoscopy  11/17/2024    Past Medical History:  Diagnosis Date   Anal fissure 1994   Anxiety    Arthritis    Dr. Arnaldo at [facility]   Depression    Endometriosis    Fracture 08/2011   fracture left wrist and elbow   Hemorrhoids 2008   Hepatomegaly    Hypothyroidism    Mixed hyperlipidemia    Obesity    OCD (obsessive compulsive disorder)    Panic disorder    Plantar fasciitis, right 03/2014   Prediabetes 01/16/2015   RLS (restless legs syndrome) 2014   vis sleep study- no sleep apnea   Shingles 07/2017   Tobacco abuse    not tolerated Chantix  in the past   Vitamin D  deficiency     Past Surgical History:  Procedure Laterality Date   CHOLECYSTECTOMY     HEMORROIDECTOMY     LEFT OOPHORECTOMY     TONSILLECTOMY       Current Outpatient Medications:    albuterol  (VENTOLIN  HFA) 108 (90 Base) MCG/ACT inhaler, INHALE 2 PUFFS BY MOUTH EVERY 6 HOURS AS NEEDED FOR WHEEZING OR SHORTNESS OF BREATH, Disp: 18 g, Rfl: 2   atorvastatin  (LIPITOR) 20 MG tablet, Take 1 tablet (20 mg total) by mouth daily., Disp: 90 tablet, Rfl: 3   Cholecalciferol (VITAMIN D3) 1.25 MG (50000 UT) CAPS, Take 1 capsule by mouth once a week,  Disp: 12 capsule, Rfl: 0   clonazePAM  (KLONOPIN ) 1 MG tablet, TAKE 1/2 TABLET BY MOUTH TWICE DAILY AND TAKE 1 TABLET BY MOUTH AT NIGHT AS NEEDED FOR ANXIETY, Disp: 60 tablet, Rfl: 1   clotrimazole -betamethasone  (LOTRISONE ) cream, Apply 1 application. topically 2 (two) times daily., Disp: 15 g, Rfl: 2   cyanocobalamin  (VITAMIN B12) 1000 MCG tablet, Take 1,000 mcg by mouth daily., Disp: , Rfl:    levothyroxine  (SYNTHROID ) 150 MCG tablet, Take  1 tablet  Daily  on an empty stomach with only water for 30 minutes & no Antacid meds, Calcium  or Magnesium for 4 hours & avoid Biotin       /       TAKE      BY       MOUTH, Disp: 90 tablet, Rfl: 0   levothyroxine  (SYNTHROID ) 25 MCG  tablet, Take 1 tablet (25 mcg total) by mouth daily. Take the 150 and the 25 dose daily, Disp: 90 tablet, Rfl: 0   mometasone (NASONEX) 50 MCG/ACT nasal spray, Place 2 sprays into the nose daily., Disp: , Rfl:    Naltrexone HCl, Pain, 4.5 MG CAPS, Take by mouth., Disp: , Rfl:    Omega-3 Fatty Acids (FISH OIL) 1200 MG CAPS, Take by mouth daily., Disp: , Rfl:    Phenylephrine-APAP-guaiFENesin (TYLENOL SINUS SEVERE PO), Take by mouth., Disp: , Rfl:    Semaglutide -Weight Management (WEGOVY ) 0.25 MG/0.5ML SOAJ, Inject 0.25 mg into the skin once a week., Disp: 2 mL, Rfl: 2   sertraline  (ZOLOFT ) 100 MG tablet, TAKE 2 TABLETS IN THE MORNING AND 1 TABLET AT NIGHT (Patient taking differently: TAKE 1 TABLET IN THE MORNING AND 2 TABLETS AT NIGHT), Disp: 90 tablet, Rfl: 2   sodium chloride  (OCEAN) 0.65 % SOLN nasal spray, Place 1 spray into both nostrils as needed for congestion., Disp: , Rfl:    triamcinolone  (NASACORT ) 55 MCG/ACT AERO nasal inhaler, Place 2 sprays into the nose daily., Disp: , Rfl:    triamcinolone  cream (KENALOG ) 0.5 %, Apply topically 2 (two) times daily as needed., Disp: , Rfl:    varenicline  (CHANTIX ) 0.5 MG tablet, Take 1 tablet (0.5 mg total) by mouth 2 (two) times daily., Disp: 60 tablet, Rfl: 1  No Known Allergies ROS neg/noncontributory except as noted HPI/below      Objective:     BP 120/80   Pulse 87   Temp 97.9 F (36.6 C)   Ht 5' 8 (1.727 m)   Wt 213 lb (96.6 kg)   LMP 08/04/2012   SpO2 98%   BMI 32.39 kg/m  Wt Readings from Last 3 Encounters:  07/26/24 213 lb (96.6 kg)  05/10/24 216 lb 4 oz (98.1 kg)  04/19/24 204 lb (92.5 kg)    Physical Exam   Gen: WDWN NAD HEENT: NCAT, conjunctiva not injected, sclera nonicteric NECK:  supple, no thyromegaly, no nodes, no carotid bruits CARDIAC: RRR, S1S2+, no murmur. DP 2+B LUNGS: CTAB. No wheezes ABDOMEN:  BS+, soft, NTND, No HSM, no masses EXT:  no edema MSK: no gross abnormalities.  NEURO: A&O x3.  CN II-XII  intact.  PSYCH: normal mood. Good eye contact  Nails curve at ends x pinkies     Assessment & Plan:  Hypothyroidism due to Hashimoto thyroiditis -     TSH -     T3, free -     T4, free  B12 deficiency -     Vitamin B12 -     CBC with Differential/Platelet  Mixed hyperlipidemia -     Lipid panel -     Comprehensive metabolic panel with GFR  Vitamin D  deficiency -     VITAMIN D  25 Hydroxy (Vit-D Deficiency, Fractures)  Screening for lung cancer -     Ambulatory Referral for Lung Cancer Scre  Other orders -     Levothyroxine  Sodium; Take 1 tablet (25 mcg total) by mouth daily. Take the 150 and the 25 dose daily  Dispense: 90 tablet; Refill: 0  Assessment and Plan Assessment & Plan Hashimoto's thyroiditis with hypothyroidism   Hashimoto's thyroiditis presents with elevated TSH at 9, free T3 at 2.1, and free T4 at 0.85. Current levothyroxine  dose is 150 mcg daily. She is concerned about synthetic medications and is researching alternatives. She is on low-dose naltrexone for inflammation, which is off-label but can help. Interested in lifestyle modifications, she is advised diet alone won't cure the condition. Increase levothyroxine  to 175 mcg by adding 25 mcg. Order thyroid  function tests in 2 months. Educate on alternating doses if necessary.  Hyperlipidemia   Hyperlipidemia with medication sensitivity. She has not started cholesterol medication due to fear of side effects. Taking fish oil, but it is insufficient to prevent cardiovascular events. Start cholesterol medication after adjusting to increased thyroid  medication dose. Re-evaluate cholesterol levels after starting medication.  Panic disorder   Panic disorder with medication sensitivity. She is cautious about new medications due to fear of panic attacks. Introduce new medications gradually to monitor for adverse reactions.  Tobacco use   She is reducing tobacco use. Discussed the impact of smoking on inflammation and  overall health. Continue to reduce tobacco use.  Vitamin B12 and D supplementation   She is taking vitamin B12 and D supplements. Discussed absorption and the option of B12 injections if oral absorption is inadequate. She is cautious about B12 injections due to potential panic attacks. Continue current vitamin B12 and D supplementation. Consider B12 injections if oral absorption is inadequate.    Return in about 3 months (around 10/26/2024) for chronic follow-up.  Jenkins CHRISTELLA Carrel, MD

## 2024-07-28 ENCOUNTER — Ambulatory Visit: Payer: Self-pay | Admitting: Family Medicine

## 2024-07-28 NOTE — Progress Notes (Signed)
 Current thyroid  dose is fine.  Don't need to increase Trigs/cholesterol too high.  Try to take meds Sodium slightly low-probably from psych meds.  I'm not concerned at this time Vitamin D  is low-needs to increase by 1000iu/d B12 is still low-consider injections but can try taking dissolvable/sublingual B12 first.  I don't think she is absorbing the pills

## 2024-07-30 ENCOUNTER — Other Ambulatory Visit: Payer: Self-pay | Admitting: Psychiatry

## 2024-07-30 DIAGNOSIS — F411 Generalized anxiety disorder: Secondary | ICD-10-CM

## 2024-07-30 DIAGNOSIS — F4001 Agoraphobia with panic disorder: Secondary | ICD-10-CM

## 2024-08-03 ENCOUNTER — Telehealth (INDEPENDENT_AMBULATORY_CARE_PROVIDER_SITE_OTHER): Admitting: Psychiatry

## 2024-08-03 ENCOUNTER — Encounter: Payer: Self-pay | Admitting: Psychiatry

## 2024-08-03 DIAGNOSIS — F422 Mixed obsessional thoughts and acts: Secondary | ICD-10-CM | POA: Diagnosis not present

## 2024-08-03 DIAGNOSIS — G2581 Restless legs syndrome: Secondary | ICD-10-CM

## 2024-08-03 DIAGNOSIS — F411 Generalized anxiety disorder: Secondary | ICD-10-CM

## 2024-08-03 DIAGNOSIS — F5105 Insomnia due to other mental disorder: Secondary | ICD-10-CM

## 2024-08-03 DIAGNOSIS — F330 Major depressive disorder, recurrent, mild: Secondary | ICD-10-CM | POA: Diagnosis not present

## 2024-08-03 DIAGNOSIS — R7989 Other specified abnormal findings of blood chemistry: Secondary | ICD-10-CM

## 2024-08-03 DIAGNOSIS — F4001 Agoraphobia with panic disorder: Secondary | ICD-10-CM | POA: Diagnosis not present

## 2024-08-03 MED ORDER — CLONAZEPAM 1 MG PO TABS: ORAL_TABLET | ORAL | 3 refills | Status: DC

## 2024-08-03 MED ORDER — SERTRALINE HCL 100 MG PO TABS: ORAL_TABLET | ORAL | 1 refills | Status: AC
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
## 2024-08-05 ENCOUNTER — Telehealth: Payer: Self-pay

## 2024-08-05 DIAGNOSIS — F17210 Nicotine dependence, cigarettes, uncomplicated: Secondary | ICD-10-CM

## 2024-08-05 DIAGNOSIS — Z122 Encounter for screening for malignant neoplasm of respiratory organs: Secondary | ICD-10-CM

## 2024-08-05 DIAGNOSIS — Z87891 Personal history of nicotine dependence: Secondary | ICD-10-CM

## 2024-08-05 NOTE — Telephone Encounter (Signed)
 Lung Cancer Screening Narrative/Criteria Questionnaire (Cigarette Smokers Only- No Cigars/Pipes/vapes)   Susan Bartlett   SDMV:08/25/2024 at  12:00 pm with Laneta      23-Aug-1966               LDCT: 08/26/2024 at 1:40 at GI    58 y.o.   Phone: [phone]   Lung Screening Narrative (confirm age 19-77 yrs Medicare / 50-80 yrs Private pay insurance)   Insurance information:Medicare   Referring Provider:    This screening involves an initial phone call with a team member from our program. It is called a shared decision making visit. The initial meeting is required by  insurance and Medicare to make sure you understand the program. This appointment takes about 15-20 minutes to complete. You will complete the screening scan at your scheduled date/time.  This scan takes about 5-10 minutes to complete. You can eat and drink normally before and after the scan.  Criteria questions for Lung Cancer Screening:   Are you a current or former smoker? Current Age began smoking: 21   If you are a former smoker, what year did you quit smoking? Yes quit 1.5 years (within 15 yrs)   To calculate your smoking history, I need an accurate estimate of how many packs of cigarettes you smoked per day and for how many years. (Not just the number of PPD you are now smoking)   Years smoking 35.5 x Packs per day 1.75 = Pack years 62.125   (at least 20 pack yrs)   (Make sure they understand that we need to know how much they have smoked in the past, not just the number of PPD they are smoking now)  Do you have a personal history of cancer? No     Do you have a family history of cancer? Yes  (cancer type and and relative) Sister had anal cancer. Mother had breast cancer. Aunt with pancreatic cancer. Grandmother with colon cancer. Aunt with breast cancer.   Are you coughing up blood?  No  Have you had unexplained weight loss of 15 lbs or more in the last 6 months? No  It looks like you meet all criteria.  When  would be a good time for us  to schedule you for this screening?   Additional information: N/A

## 2024-08-09 ENCOUNTER — Ambulatory Visit

## 2024-08-09 VITALS — Ht 68.0 in | Wt 211.0 lb

## 2024-08-09 DIAGNOSIS — Z0000 Encounter for general adult medical examination without abnormal findings: Secondary | ICD-10-CM

## 2024-08-09 DIAGNOSIS — Z1211 Encounter for screening for malignant neoplasm of colon: Secondary | ICD-10-CM | POA: Diagnosis not present

## 2024-08-09 NOTE — Patient Instructions (Signed)
 Ms. Spaid,  Thank you for taking the time for your Medicare Wellness Visit. I appreciate your continued commitment to your health goals. Please review the care plan we discussed, and feel free to reach out if I can assist you further.  Medicare recommends these wellness visits once per year to help you and your care team stay ahead of potential health issues. These visits are designed to focus on prevention, allowing your provider to concentrate on managing your acute and chronic conditions during your regular appointments.  Please note that Annual Wellness Visits do not include a physical exam. Some assessments may be limited, especially if the visit was conducted virtually. If needed, we may recommend a separate in-person follow-up with your provider.  Ongoing Care Seeing your primary care provider every 3 to 6 months helps us  monitor your health and provide consistent, personalized care.   Referrals If a referral was made during today's visit and you haven't received any updates within two weeks, please contact the referred provider directly to check on the status.  Recommended Screenings:  Health Maintenance  Topic Date Due   COVID-19 Vaccine (1) Never done   Screening for Lung Cancer  01/07/2023   Medicare Annual Wellness Visit  05/18/2024   Colon Cancer Screening  11/17/2024   Pneumococcal Vaccine for age over 41 (1 of 2 - PCV) 10/03/2024*   Hepatitis B Vaccine (1 of 3 - 19+ 3-dose series) 10/12/2024*   DTaP/Tdap/Td vaccine (2 - Tdap) 10/13/2024*   Zoster (Shingles) Vaccine (1 of 2) 10/18/2024*   Flu Shot  01/10/2025*   Breast Cancer Screening  04/27/2025   Pap with HPV screening  04/27/2028   HIV Screening  Completed   HPV Vaccine  Aged Out   Meningitis B Vaccine  Aged Out   Hepatitis C Screening  Discontinued  *Topic was postponed. The date shown is not the original due date.       04/19/2024    5:00 AM  Advanced Directives  Does Patient Have a Medical Advance Directive?  No   Advance Care Planning is important because it: Ensures you receive medical care that aligns with your values, goals, and preferences. Provides guidance to your family and loved ones, reducing the emotional burden of decision-making during critical moments.  Vision: Annual vision screenings are recommended for early detection of glaucoma, cataracts, and diabetic retinopathy. These exams can also reveal signs of chronic conditions such as diabetes and high blood pressure.  Dental: Annual dental screenings help detect early signs of oral cancer, gum disease, and other conditions linked to overall health, including heart disease and diabetes.  Please see the attached documents for additional preventive care recommendations.

## 2024-08-09 NOTE — Progress Notes (Addendum)
 Subjective:   Susan Bartlett is a 58 y.o. who presents for a Medicare Wellness preventive visit.  As a reminder, Annual Wellness Visits don't include a physical exam, and some assessments may be limited, especially if this visit is performed virtually. We may recommend an in-person follow-up visit with your provider if needed.  Visit Complete: Virtual I connected with  Susan Bartlett on 08/09/24 by a audio enabled telemedicine application and verified that I am speaking with the correct person using two identifiers.  Patient Location: Home  Provider Location: Office/Clinic  I discussed the limitations of evaluation and management by telemedicine. The patient expressed understanding and agreed to proceed.  Vital Signs: Because this visit was a virtual/telehealth visit, some criteria may be missing or patient reported. Any vitals not documented were not able to be obtained and vitals that have been documented are patient reported.  VideoDeclined- This patient declined Librarian, academic. Therefore the visit was completed with audio only.  Persons Participating in Visit: Patient.  AWV Questionnaire: No: Patient Medicare AWV questionnaire was not completed prior to this visit.  Cardiac Risk Factors include: dyslipidemia;obesity (BMI >30kg/m2);smoking/ tobacco exposure;hypertension     Objective:    Today's Vitals   08/09/24 1429  Weight: 211 lb (95.7 kg)  Height: 5' 8 (1.727 m)   Body mass index is 32.08 kg/m.     08/09/2024    2:34 PM 04/19/2024    5:00 AM 07/08/2022    9:42 PM 06/13/2021    2:53 PM 05/09/2021    8:37 PM 11/24/2018    4:33 PM 11/04/2017   11:18 AM  Advanced Directives  Does Patient Have a Medical Advance Directive? No No No No No No  No   Would patient like information on creating a medical advance directive? No - Patient declined   No - Patient declined No - Patient declined Yes (MAU/Ambulatory/Procedural Areas - Information given)  No  - Patient declined      Data saved with a previous flowsheet row definition    Current Medications (verified) Outpatient Encounter Medications as of 08/09/2024  Medication Sig   albuterol  (VENTOLIN  HFA) 108 (90 Base) MCG/ACT inhaler INHALE 2 PUFFS BY MOUTH EVERY 6 HOURS AS NEEDED FOR WHEEZING OR SHORTNESS OF BREATH   atorvastatin  (LIPITOR) 20 MG tablet Take 1 tablet (20 mg total) by mouth daily.   Cholecalciferol (VITAMIN D3) 1.25 MG (50000 UT) CAPS Take 1 capsule by mouth once a week   clonazePAM  (KLONOPIN ) 1 MG tablet TAKE 1/2 TABLET BY MOUTH TWICE DAILY AND 1 TABLET AT NIGHT AS NEEDED FOR ANXIETY   clotrimazole -betamethasone  (LOTRISONE ) cream Apply 1 application. topically 2 (two) times daily.   cyanocobalamin  (VITAMIN B12) 1000 MCG tablet Take 1,000 mcg by mouth daily.   ferrous sulfate 325 (65 FE) MG EC tablet Take 325 mg by mouth 3 (three) times daily with meals. (Patient taking differently: Take 325 mg by mouth daily.)   levothyroxine  (SYNTHROID ) 150 MCG tablet Take  1 tablet  Daily  on an empty stomach with only water for 30 minutes & no Antacid meds, Calcium  or Magnesium for 4 hours & avoid Biotin       /       TAKE      BY       MOUTH   Naltrexone HCl, Pain, 4.5 MG CAPS Take by mouth.   Omega-3 Fatty Acids (FISH OIL) 1200 MG CAPS Take by mouth daily.   Phenylephrine-APAP-guaiFENesin (TYLENOL SINUS SEVERE  PO) Take by mouth.   Semaglutide -Weight Management (WEGOVY ) 0.25 MG/0.5ML SOAJ Inject 0.25 mg into the skin once a week.   sertraline  (ZOLOFT ) 100 MG tablet TAKE 2 TABLETS IN THE MORNING AND 1 TABLET AT NIGHT   sodium chloride  (OCEAN) 0.65 % SOLN nasal spray Place 1 spray into both nostrils as needed for congestion.   triamcinolone  (NASACORT ) 55 MCG/ACT AERO nasal inhaler Place 2 sprays into the nose daily.   levothyroxine  (SYNTHROID ) 25 MCG tablet Take 1 tablet (25 mcg total) by mouth daily. Take the 150 and the 25 dose daily (Patient not taking: Reported on 08/09/2024)    mometasone (NASONEX) 50 MCG/ACT nasal spray Place 2 sprays into the nose daily. (Patient not taking: Reported on 08/09/2024)   varenicline  (CHANTIX ) 0.5 MG tablet Take 1 tablet (0.5 mg total) by mouth 2 (two) times daily. (Patient not taking: Reported on 08/09/2024)   [DISCONTINUED] triamcinolone  cream (KENALOG ) 0.5 % Apply topically 2 (two) times daily as needed.   No facility-administered encounter medications on file as of 08/09/2024.    Allergies (verified) Patient has no known allergies.   History: Past Medical History:  Diagnosis Date   Anal fissure 1994   Anxiety    Arthritis    Dr. Arnaldo at [facility]   Depression    Endometriosis    Fracture 08/2011   fracture left wrist and elbow   Hemorrhoids 2008   Hepatomegaly    Hypothyroidism    Mixed hyperlipidemia    Obesity    OCD (obsessive compulsive disorder)    Panic disorder    Plantar fasciitis, right 03/2014   Prediabetes 01/16/2015   RLS (restless legs syndrome) 2014   vis sleep study- no sleep apnea   Shingles 07/2017   Tobacco abuse    not tolerated Chantix  in the past   Vitamin D  deficiency    Past Surgical History:  Procedure Laterality Date   CHOLECYSTECTOMY     HEMORROIDECTOMY     LEFT OOPHORECTOMY     TONSILLECTOMY     Family History  Problem Relation Age of Onset   Breast cancer Mother 70 - 26   Diabetes Mother    Cirrhosis Father        alcohol   Heart disease Father        MI   Cancer Sister 63       anal   Alcohol abuse Sister    Drug abuse Brother        and alcohol   Colon cancer Maternal Grandmother 54   Breast cancer Cousin    Thyroid  disease Neg Hx    Social History   Socioeconomic History   Marital status: Single    Spouse name: Not on file   Number of children: 1   Years of education: Not on file   Highest education level: Not on file  Occupational History   Occupation: Agricultural Consultant: [employer]  Tobacco Use   Smoking status: Every Day    Current  packs/day: 1.00    Average packs/day: 1 pack/day for 35.8 years (35.8 ttl pk-yrs)    Types: Cigarettes    Start date: 1990   Smokeless tobacco: Never   Tobacco comments:    Previously 1.5 pack per day, has been tapering down, as of 04/02/2022 reports down to 3 cigs/day  Vaping Use   Vaping status: Never Used  Substance and Sexual Activity   Alcohol use: Not Currently    Comment: occ glass  of wine   Drug use: No   Sexual activity: Not Currently    Birth control/protection: Post-menopausal  Other Topics Concern   Not on file  Social History Narrative   Not on file   Social Drivers of Health   Financial Resource Strain: Low Risk  (08/09/2024)   Overall Financial Resource Strain (CARDIA)    Difficulty of Paying Living Expenses: Not hard at all  Food Insecurity: No Food Insecurity (08/09/2024)   Hunger Vital Sign    Worried About Running Out of Food in the Last Year: Never true    Ran Out of Food in the Last Year: Never true  Transportation Needs: No Transportation Needs (08/09/2024)   PRAPARE - Administrator, Civil Service (Medical): No    Lack of Transportation (Non-Medical): No  Physical Activity: Insufficiently Active (08/09/2024)   Exercise Vital Sign    Days of Exercise per Week: 2 days    Minutes of Exercise per Session: 30 min  Stress: No Stress Concern Present (08/09/2024)   [employer] of Occupational Health - Occupational Stress Questionnaire    Feeling of Stress: Only a little  Social Connections: Moderately Integrated (08/09/2024)   Social Connection and Isolation Panel    Frequency of Communication with Friends and Family: More than three times a week    Frequency of Social Gatherings with Friends and Family: More than three times a week    Attends Religious Services: More than 4 times per year    Active Member of [employer] or Organizations: Yes    Attends Banker Meetings: 1 to 4 times per year    Marital Status: Never married     Tobacco Counseling Ready to quit: Not Answered Counseling given: Not Answered Tobacco comments: Previously 1.5 pack per day, has been tapering down, as of 04/02/2022 reports down to 3 cigs/day    Clinical Intake:  Pre-visit preparation completed: Yes  Pain : No/denies pain     BMI - recorded: 32.08 Nutritional Status: BMI > 30  Obese Nutritional Risks: None Diabetes: No  Lab Results  Component Value Date   HGBA1C 5.5 02/26/2024   HGBA1C 5.6 03/20/2023   HGBA1C 5.5 11/04/2022     How often do you need to have someone help you when you read instructions, pamphlets, or other written materials from your doctor or pharmacy?: 1 - Never  Interpreter Needed?: No  Information entered by :: Ellouise Haws, LPN   Activities of Daily Living      08/09/2024    2:30 PM  In your present state of health, do you have any difficulty performing the following activities:  Hearing? 0  Vision? 0  Difficulty concentrating or making decisions? 0  Walking or climbing stairs? 0  Dressing or bathing? 0  Doing errands, shopping? 0  Preparing Food and eating ? N  Using the Toilet? N  In the past six months, have you accidently leaked urine? N  Do you have problems with loss of bowel control? N  Managing your Medications? N  Managing your Finances? N  Housekeeping or managing your Housekeeping? N    Patient Care Team: Wendolyn Jenkins Jansky, MD as PCP - General (Family Medicine) Delford Maude BROCKS, MD as PCP - Cardiology (Cardiology) Avram Lupita BRAVO, MD as Consulting Physician (Gastroenterology) Cottle, Lorene KANDICE Raddle., MD as Attending Physician (Psychiatry)   I have updated your Care Teams any recent Medical Services you may have received from other providers in the past year.  Assessment:   This is a routine wellness examination for Susan Bartlett.  Hearing/Vision screen Hearing Screening - Comments:: Pt denies any hearing issues  Vision Screening - Comments:: Wears rx glasses - up to  date with routine eye exams with Dr Robinson    Goals Addressed               This Visit's Progress     weight loss (pt-stated)         Depression Screen      08/09/2024    2:31 PM 05/10/2024    4:09 PM 02/26/2024    1:26 PM 01/06/2024    4:45 PM 04/28/2023    2:48 PM 07/08/2022    9:42 PM 12/31/2021    4:30 PM  PHQ 2/9 Scores  PHQ - 2 Score 0 1 0  0 2 0  PHQ- 9 Score  4    4      Information is confidential and restricted. Go to Review Flowsheets to unlock data.    Fall Risk      08/09/2024    2:33 PM 05/10/2024    4:10 PM 02/26/2024    1:23 PM 02/24/2024    8:11 AM 07/08/2022    9:42 PM  Fall Risk   Falls in the past year? 1 1 0 0 0  Number falls in past yr: 1 1 0 0   Injury with Fall? 1 1 0 0   Comment left foot broke toe      Risk for fall due to : Impaired balance/gait;History of fall(s) Impaired balance/gait No Fall Risks No Fall Risks   Follow up Falls prevention discussed Falls prevention discussed Falls evaluation completed Falls evaluation completed     MEDICARE RISK AT HOME:   Medicare Risk at Home Any stairs in or around the home?: Yes If so, are there any without handrails?: No Home free of loose throw rugs in walkways, pet beds, electrical cords, etc?: Yes Adequate lighting in your home to reduce risk of falls?: Yes Life alert?: No Use of a cane, walker or w/c?: No Grab bars in the bathroom?: No Shower chair or bench in shower?: No Elevated toilet seat or a handicapped toilet?: No  TIMED UP AND GO:  Was the test performed?  No  Cognitive Function: 6CIT completed        08/09/2024    2:35 PM  6CIT Screen  What Year? 0 points  What month? 0 points  What time? 0 points  Count back from 20 0 points  Months in reverse 0 points  Repeat phrase 0 points  Total Score 0 points    Immunizations Immunization History  Administered Date(s) Administered   Influenza Split 08/01/2013   Td 11/18/2012    Screening Tests Health Maintenance   Topic Date Due   COVID-19 Vaccine (1) Never done   Lung Cancer Screening  01/07/2023   Colonoscopy  11/17/2024   Pneumococcal Vaccine: 50+ Years (1 of 2 - PCV) 10/03/2024 (Originally 11/07/1983)   Hepatitis B Vaccines 19-59 Average Risk (1 of 3 - 19+ 3-dose series) 10/12/2024 (Originally 11/07/1983)   DTaP/Tdap/Td (2 - Tdap) 10/13/2024 (Originally 11/18/2022)   Zoster Vaccines- Shingrix (1 of 2) 10/18/2024 (Originally 11/07/1983)   Influenza Vaccine  01/10/2025 (Originally 05/13/2024)   Mammogram  04/27/2025   Medicare Annual Wellness (AWV)  08/09/2025   Cervical Cancer Screening (HPV/Pap Cotest)  04/27/2028   HIV Screening  Completed   HPV VACCINES  Aged Out   Meningococcal B Vaccine  Aged Out   Hepatitis C Screening  Discontinued    Health Maintenance Items Addressed: Referral sent to GI for colonoscopy, See Nurse Notes at the end of this note  Additional Screening:  Vision Screening: Recommended annual ophthalmology exams for early detection of glaucoma and other disorders of the eye. Is the patient up to date with their annual eye exam?  Yes  Who is the provider or what is the name of the office in which the patient attends annual eye exams? Dr Robinson   Dental Screening: Recommended annual dental exams for proper oral hygiene  Community Resource Referral / Chronic Care Management: CRR required this visit?  No   CCM required this visit?  No   Plan:    I have personally reviewed and noted the following in the patient's chart:   Medical and social history Use of alcohol, tobacco or illicit drugs  Current medications and supplements including opioid prescriptions. Patient is not currently taking opioid prescriptions. Functional ability and status Nutritional status Physical activity Advanced directives List of other physicians Hospitalizations, surgeries, and ER visits in previous 12 months Vitals Screenings to include cognitive, depression, and falls Referrals and  appointments  In addition, I have reviewed and discussed with patient certain preventive protocols, quality metrics, and best practice recommendations. A written personalized care plan for preventive services as well as general preventive health recommendations were provided to patient.   Ellouise VEAR Haws, LPN   89/71/7974   After Visit Summary: (MyChart) Due to this being a telephonic visit, the after visit summary with patients personalized plan was offered to patient via MyChart   Notes: Nothing significant to report at this time.

## 2024-08-10 ENCOUNTER — Ambulatory Visit: Admitting: Family Medicine

## 2024-08-11 ENCOUNTER — Ambulatory Visit (HOSPITAL_BASED_OUTPATIENT_CLINIC_OR_DEPARTMENT_OTHER): Admitting: Obstetrics & Gynecology

## 2024-08-11 NOTE — Progress Notes (Deleted)
 ANNUAL EXAM Patient name: Susan Bartlett MRN [id number]  Date of birth: 09/05/1966 Chief Complaint:   No chief complaint on file.  History of Present Illness:   Susan Bartlett is a 58 y.o. G1P1 Caucasian female being seen today for a routine annual exam.  Current complaints: ***  Patient's last menstrual period was 08/04/2012.   The pregnancy intention screening data noted above was reviewed. Potential methods of contraception were discussed. The patient elected to proceed with No data recorded.   Last pap 04/28/2023. Results were: NILM w/ HRHPV negative. H/O abnormal pap: {yes/yes***/no:23866} Last mammogram: 04/28/2023. Results were: normal. Family h/o breast cancer: {yes***/no:23838} Last colonoscopy: 11/17/2014. Results were: normal. Family h/o colorectal cancer: {yes***/no:23838}     08/09/2024    2:31 PM 05/10/2024    4:09 PM 02/26/2024    1:26 PM 01/06/2024    4:45 PM 04/28/2023    2:48 PM  Depression screen PHQ 2/9  Decreased Interest 0 1 0  0  Down, Depressed, Hopeless 0 0 0  0  PHQ - 2 Score 0 1 0  0  Altered sleeping  1     Tired, decreased energy  2     Change in appetite  0     Feeling bad or failure about yourself   0     Trouble concentrating  0     Moving slowly or fidgety/restless  0     Suicidal thoughts  0     PHQ-9 Score  4     Difficult doing work/chores  Extremely dIfficult        Information is confidential and restricted. Go to Review Flowsheets to unlock data.        05/10/2024    4:10 PM 02/26/2024    1:26 PM 01/06/2024    4:48 PM  GAD 7 : Generalized Anxiety Score  Nervous, Anxious, on Edge 2 0   Control/stop worrying 2 0   Worry too much - different things 2 0   Trouble relaxing 2 0   Restless 2 0   Easily annoyed or irritable 1 0   Afraid - awful might happen 2 0   Total GAD 7 Score 13 0   Anxiety Difficulty Somewhat difficult Not difficult at all      Information is confidential and restricted. Go to Review Flowsheets to unlock data.      Review of Systems:   Pertinent items are noted in HPI Denies any headaches, blurred vision, fatigue, shortness of breath, chest pain, abdominal pain, abnormal vaginal discharge/itching/odor/irritation, problems with periods, bowel movements, urination, or intercourse unless otherwise stated above. Pertinent History Reviewed:  Reviewed past medical,surgical, social and family history.  Reviewed problem list, medications and allergies. Physical Assessment:  There were no vitals filed for this visit.There is no height or weight on file to calculate BMI.        Physical Examination:   General appearance - well appearing, and in no distress  Mental status - alert, oriented to person, place, and time  Psych:  She has a normal mood and affect  Skin - warm and dry, normal color, no suspicious lesions noted  Chest - effort normal, all lung fields clear to auscultation bilaterally  Heart - normal rate and regular rhythm  Neck:  midline trachea, no thyromegaly or nodules  Breasts - breasts appear normal, no suspicious masses, no skin or nipple changes or  axillary nodes  Abdomen - soft, nontender, nondistended, no masses or organomegaly  Pelvic - VULVA: normal appearing vulva with no masses, tenderness or lesions  VAGINA: normal appearing vagina with normal color and discharge, no lesions  CERVIX: normal appearing cervix without discharge or lesions, no CMT  Thin prep pap is {Desc; done/not:10129} *** HR HPV cotesting  UTERUS: uterus is felt to be normal size, shape, consistency and nontender   ADNEXA: No adnexal masses or tenderness noted.  Rectal - normal rectal, good sphincter tone, no masses felt. Hemoccult: ***  Extremities:  No swelling or varicosities noted  Chaperone present for exam  No results found for this or any previous visit (from the past 24 hours).  Assessment & Plan:  1) Well-Woman Exam  2) ***  Labs/procedures today: ***  Mammogram: {Mammo f/u:25212::@ 58yo}, or  sooner if problems Colonoscopy: {TCS f/u:25213::@ 58yo}, or sooner if problems  No orders of the defined types were placed in this encounter.   Meds: No orders of the defined types were placed in this encounter.   Follow-up: No follow-ups on file.  Morna LOISE Quale, RN 08/11/2024 9:39 AM

## 2024-08-25 ENCOUNTER — Ambulatory Visit: Admitting: *Deleted

## 2024-08-25 ENCOUNTER — Ambulatory Visit: Payer: Self-pay

## 2024-08-25 NOTE — Progress Notes (Signed)
 This encounter was created in error - please disregard.  Unable to reach patient.

## 2024-08-25 NOTE — Telephone Encounter (Signed)
 FYI Only or Action Required?: FYI only for provider: appointment scheduled on 08/26/2024, video appt.  Patient was last seen in primary care on 07/26/2024 by Susan Jenkins Jansky, MD.  Called Nurse Triage reporting Dizziness.  Symptoms began today.  Interventions attempted: Prescription medications: meclizine .  Symptoms are: unchanged.  Triage Disposition: See PCP When Office is Open (Within 3 Days)  Patient/caregiver understands and will follow disposition?: Yes  Copied from CRM #[id number]. Topic: Clinical - Medical Advice >> Aug 25, 2024  4:12 PM Nessti S wrote: Reason for CRM: pt called to see if pcp can do complete the vertigo exercises with her. Call back number [phone] Reason for Disposition  [1] MODERATE dizziness (e.g., vertigo; feels very unsteady, interferes with normal activities) AND [2] has been evaluated by doctor (or NP/PA) for this  Answer Assessment - Initial Assessment Questions Has been sick for the past two weeks with cough, cold, and congestion  2. VERTIGO: Do you feel like either you or the room is spinning or tilting?      tilting 3. LIGHTHEADED: Do you feel lightheaded? (e.g., somewhat faint, woozy, weak upon standing)     denies 4. SEVERITY: How bad is it?  Can you walk?     Can walk down hallway or where she can hold on to something 5. ONSET:  When did the dizziness begin?     0430 today 6. AGGRAVATING FACTORS: Does anything make it worse? (e.g., standing, change in head position)     Any small movement 7. CAUSE: What do you think is causing the dizziness?     vertigo 8. RECURRENT SYMPTOM: Have you had dizziness before? If Yes, ask: When was the last time? What happened that time?     Has had vertigo in the past 9. OTHER SYMPTOMS: Do you have any other symptoms? (e.g., earache, headache, numbness, tinnitus, vomiting, weakness)     denies  Protocols used: Dizziness - Vertigo-A-AH

## 2024-08-26 ENCOUNTER — Encounter: Payer: Self-pay | Admitting: Physician Assistant

## 2024-08-26 ENCOUNTER — Telehealth: Admitting: Physician Assistant

## 2024-08-26 ENCOUNTER — Inpatient Hospital Stay: Admission: RE | Admit: 2024-08-26 | Source: Ambulatory Visit

## 2024-08-26 DIAGNOSIS — F410 Panic disorder [episodic paroxysmal anxiety] without agoraphobia: Secondary | ICD-10-CM

## 2024-08-26 DIAGNOSIS — H8110 Benign paroxysmal vertigo, unspecified ear: Secondary | ICD-10-CM | POA: Diagnosis not present

## 2024-08-26 DIAGNOSIS — R0981 Nasal congestion: Secondary | ICD-10-CM

## 2024-08-26 MED ORDER — MECLIZINE HCL 25 MG PO TABS: 25.0000 mg | ORAL_TABLET | Freq: Three times a day (TID) | ORAL | 1 refills | Status: AC | PRN

## 2024-08-26 NOTE — Progress Notes (Signed)
 Virtual Visit via Video Note  I connected with  Susan Bartlett  on 08/26/24 at 10:30 AM EST by a video enabled telemedicine application and verified that I am speaking with the correct person using two identifiers.  Location: Patient: home Provider: [employer] at [employer] Persons present: Patient and myself   I discussed the limitations of evaluation and management by telemedicine and the availability of in person appointments. The patient expressed understanding and agreed to proceed.   History of Present Illness:  Discussed the use of AI scribe software for clinical note transcription with the patient, who gave verbal consent to proceed.  History of Present Illness Susan Bartlett is a 58 year old female with a history of vertigo who presents with dizziness and suspected vertigo.  She has been experiencing dizziness for the past couple of weeks, characterized by a spinning sensation when lying down on either side. Initially, the dizziness was only on the right side but has progressed to both sides. The sensation is particularly intense when she lies back down, requiring her to hold onto the mattress and close her eyes until it subsides. She has a history of vertigo, having experienced it three or four times in the past, but not in the last ten years.  She has been managing her symptoms with over-the-counter meclizine , taking half of a 25 mg tablet twice daily, as she recalls taking it more frequently in the past. She attempted the [facility] maneuver, a half somersault exercise, with her sister's assistance, but it resulted in increased dizziness. No nausea is present, but she did experience a decreased appetite yesterday.  She has a history of panic disorder and is cautious about the dizziness triggering her anxiety. She denies recent COVID-19 infection, noting that her previous experience with COVID-19 was much worse, with no fever or body aches this time. She has been  consuming chai tea, green tea with honey, and warm water with honey to stay hydrated.  She reports sinus headaches, which she manages with Tylenol Sinus. She does not use Sudafed due to past adverse reactions. She has mucus relief, Mucinex nasal spray, Nyquil, DayQuil, and Theraflu available at home. She experiences sinus pressure and post-nasal drip and reports sinus headaches, which she manages with Tylenol Sinus.     Observations/Objective:   Gen: Awake, alert, no acute distress Resp: Breathing is even and non-labored Psych: calm/pleasant demeanor Neuro: Alert and Oriented x 3, + facial symmetry, speech is clear.   Assessment and Plan:  Assessment & Plan Benign paroxysmal vertigo Recurrent vertigo episodes, currently experiencing dizziness exacerbated by positional changes. Symptoms include spinning sensation when lying down, particularly on both sides. Previous episodes occurred 3-4 times, last episode 10 years ago. Current episode likely related to recent respiratory illness and sinus congestion. Foster maneuver attempted with increased dizziness. - Prescribed meclizine  25 mg, take three times a day. - Provided handout on Epley maneuver for home use. - Advised to contact if no improvement by Monday afternoon. - Discussed potential referral to physical therapy if symptoms persist.  Sinus congestion Contributing to vertigo symptoms. Previous respiratory illness with improvement in other symptoms, but dizziness persists. Tylenol sinus used for sinus headaches. - Continue Tylenol sinus for sinus headache relief. - Encouraged use of nasal spray and mucus relief to aid sinus drainage. - Advised increased fluid intake with electrolytes to thin mucus.  Panic disorder Current anxiety related to dizziness.    Follow Up Instructions:    I discussed  the assessment and treatment plan with the patient. The patient was provided an opportunity to ask questions and all were answered. The  patient agreed with the plan and demonstrated an understanding of the instructions.   The patient was advised to call back or seek an in-person evaluation if the symptoms worsen or if the condition fails to improve as anticipated.  Lanesha Azzaro M Carnisha Feltz, PA-C

## 2024-08-26 NOTE — Telephone Encounter (Signed)
 Appt today

## 2024-09-05 ENCOUNTER — Ambulatory Visit: Payer: Self-pay

## 2024-09-05 NOTE — Telephone Encounter (Signed)
 FYI Only or Action Required?: FYI only for provider: appointment scheduled on 09/06/2024 at 1:40 PM.  Patient was last seen in primary care on 08/26/2024 by Allwardt, Alyssa M, PA-C.  Called Nurse Triage reporting Dizziness.  Symptoms began several days ago.  Interventions attempted: Rest, hydration, or home remedies.  Symptoms are: unchanged.  Triage Disposition: See PCP When Office is Open (Within 3 Days)  Patient/caregiver understands and will follow disposition?: Yes  Copied from CRM #[id number]. Topic: Clinical - Red Word Triage >> Sep 05, 2024 11:18 AM Robinson H wrote: [facility] that prompted transfer to Nurse Triage: Still having issues with Vertigo now she gets dizzy when she moves her head up and down. Reason for Disposition  [1] MODERATE dizziness (e.g., vertigo; feels very unsteady, interferes with normal activities) AND [2] has been evaluated by doctor (or NP/PA) for this  Answer Assessment - Initial Assessment Questions Moderate dizziness that started on Friday. Patient reports she has been dealing with vertigo which worsened when she got sick recently. Reports having continued congestion.   1. DESCRIPTION: Describe your dizziness.     Patient reports a spinning sensation. States this happens when she moves her head up and down 2. VERTIGO: Do you feel like either you or the room is spinning or tilting?      yes 3. LIGHTHEADED: Do you feel lightheaded? (e.g., somewhat faint, woozy, weak upon standing)     no 4. SEVERITY: How bad is it?  Can you walk?     Moderate when patient has to get up and walk.  5. ONSET:  When did the dizziness begin?     Friday night 6. AGGRAVATING FACTORS: Does anything make it worse? (e.g., standing, change in head position)     Change in head position 7. CAUSE: What do you think is causing the dizziness?     unsure 8. RECURRENT SYMPTOM: Have you had dizziness before? If Yes, ask: When was the last time? What happened that  time?     yes 9. OTHER SYMPTOMS: Do you have any other symptoms? (e.g., earache, headache, numbness, tinnitus, vomiting, weakness)     congestion  Protocols used: Dizziness - Vertigo-A-AH

## 2024-09-06 ENCOUNTER — Telehealth: Payer: Self-pay

## 2024-09-06 ENCOUNTER — Encounter: Payer: Self-pay | Admitting: Family Medicine

## 2024-09-06 ENCOUNTER — Ambulatory Visit (INDEPENDENT_AMBULATORY_CARE_PROVIDER_SITE_OTHER): Admitting: Family Medicine

## 2024-09-06 ENCOUNTER — Telehealth: Payer: Self-pay | Admitting: Family Medicine

## 2024-09-06 ENCOUNTER — Other Ambulatory Visit: Payer: Self-pay

## 2024-09-06 VITALS — BP 106/66 | HR 87 | Temp 97.3°F | Resp 14 | Ht 68.0 in | Wt 212.6 lb

## 2024-09-06 DIAGNOSIS — E782 Mixed hyperlipidemia: Secondary | ICD-10-CM | POA: Diagnosis not present

## 2024-09-06 DIAGNOSIS — J309 Allergic rhinitis, unspecified: Secondary | ICD-10-CM

## 2024-09-06 DIAGNOSIS — E559 Vitamin D deficiency, unspecified: Secondary | ICD-10-CM

## 2024-09-06 DIAGNOSIS — L659 Nonscarring hair loss, unspecified: Secondary | ICD-10-CM

## 2024-09-06 DIAGNOSIS — E039 Hypothyroidism, unspecified: Secondary | ICD-10-CM

## 2024-09-06 DIAGNOSIS — H8113 Benign paroxysmal vertigo, bilateral: Secondary | ICD-10-CM | POA: Diagnosis not present

## 2024-09-06 DIAGNOSIS — E538 Deficiency of other specified B group vitamins: Secondary | ICD-10-CM

## 2024-09-06 LAB — IBC + FERRITIN
Ferritin: 45.6 ng/mL (ref 10.0–291.0)
Iron: 119 ug/dL (ref 42–145)
Saturation Ratios: 32.7 % (ref 20.0–50.0)
TIBC: 364 ug/dL (ref 250.0–450.0)
Transferrin: 260 mg/dL (ref 212.0–360.0)

## 2024-09-06 LAB — VITAMIN B12: Vitamin B-12: 214 pg/mL (ref 211–911)

## 2024-09-06 LAB — COMPREHENSIVE METABOLIC PANEL WITH GFR
ALT: 13 U/L (ref 0–35)
AST: 14 U/L (ref 0–37)
Albumin: 4.4 g/dL (ref 3.5–5.2)
Alkaline Phosphatase: 88 U/L (ref 39–117)
BUN: 10 mg/dL (ref 6–23)
CO2: 26 meq/L (ref 19–32)
Calcium: 9.8 mg/dL (ref 8.4–10.5)
Chloride: 103 meq/L (ref 96–112)
Creatinine, Ser: 0.9 mg/dL (ref 0.40–1.20)
GFR: 69.82 mL/min (ref >60.00)
Glucose, Bld: 99 mg/dL (ref 70–99)
Potassium: 4 meq/L (ref 3.5–5.1)
Sodium: 136 meq/L (ref 135–145)
Total Bilirubin: 0.5 mg/dL (ref 0.2–1.2)
Total Protein: 6.8 g/dL (ref 6.0–8.3)

## 2024-09-06 LAB — LIPID PANEL
Cholesterol: 159 mg/dL (ref 0–200)
HDL: 42.2 mg/dL (ref >39.00)
LDL Cholesterol: 64 mg/dL (ref 0–99)
NonHDL: 116.34
Total CHOL/HDL Ratio: 4
Triglycerides: 261 mg/dL — ABNORMAL HIGH (ref 0.0–149.0)
VLDL: 52.2 mg/dL — ABNORMAL HIGH (ref 0.0–40.0)

## 2024-09-06 LAB — CBC
HCT: 40.3 % (ref 36.0–46.0)
Hemoglobin: 13.8 g/dL (ref 12.0–15.0)
MCHC: 34.4 g/dL (ref 30.0–36.0)
MCV: 90.6 fl (ref 78.0–100.0)
Platelets: 270 K/uL (ref 150.0–400.0)
RBC: 4.44 Mil/uL (ref 3.87–5.11)
RDW: 13.3 % (ref 11.5–15.5)
WBC: 7 K/uL (ref 4.0–10.5)

## 2024-09-06 LAB — VITAMIN D 25 HYDROXY (VIT D DEFICIENCY, FRACTURES): VITD: 21.11 ng/mL — ABNORMAL LOW (ref 30.00–100.00)

## 2024-09-06 LAB — FOLATE: Folate: 11.3 ng/mL (ref >5.9)

## 2024-09-06 LAB — TSH: TSH: 1.36 u[IU]/mL (ref 0.35–5.50)

## 2024-09-06 MED ORDER — AZELASTINE HCL 0.1 % NA SOLN: 2.0000 | Freq: Two times a day (BID) | NASAL | 12 refills | Status: AC

## 2024-09-06 NOTE — Progress Notes (Signed)
 Susan Bartlett is a 58 y.o. female who presents today for an office visit.  Assessment/Plan:  Vertigo  History consistent with BPPV.  Overall reassuring exam today.  It is also reassuring that she has had some improvement over the last several days.  She will continue taking meclizine  as needed.  She will continue working on home exercises.  We did discuss referral to vestibular rehab for her Epley maneuvers however she declined due to issues with transportation.  She does have mild eustachian tube dysfunction which may be contributing as well and will be starting Astelin  as below.  She will let us  know if not improving over the next couple of weeks and would consider referral versus imaging at that time.  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  They discussed treatment options.  She has tried to see dermatology but they are not currently excepting patients for this.  Recommended topical minoxidil once daily.  Did discuss with patient it would take several months before she would notice any improvement.  Will also check labs today.  She will follow-up with PCP or dermatology if not improving  Allergic rhinitis Some mild eustachian tube dysfunction on exam today.  She is already on Zyrtec and mometasone nasal spray.  Will add on Astelin .  Hopefully this should help some with her above vertigo.  Hypothyroidism On Synthroid  150 mcg daily per PCP.  Check TSH today with labs for patient request  Dyslipidemia On Lipitor 20 mg daily.  Will recheck lipids today per patient request.    Subjective:  HPI:  See assessment / plan for status of chronic conditions.  Discussed the use of AI scribe software for clinical note transcription with the patient, who gave verbal consent to proceed.  History of Present Illness Susan Bartlett is a 58 year old female who presents with vertigo and hair thinning.  She experiences vertigo characterized by a sensation of the room  spinning, triggered by looking down or up, and previously by turning to the right while lying down. The vertigo was more frequent last week but has decreased as she has become more aware of her movements. She has a history of vertigo from her teenage years and was evaluated by an ENT at that time, who diagnosed her with a deviated septum and surfer's ear. Recently, she had a head cold with congestion, sore throat, and laryngitis lasting two weeks, which she initially thought might be related to her vertigo. She has been performing somersault exercises, which provided temporary relief.  She reports hair thinning, particularly noticing a widening part and a patch at the back of her head. Initially attributing it to graying, she realized significant thinning after coloring her hair. She has not tried any treatments yet due to her thyroid  medication contraindicating biotin use. Her hair was previously very thick, requiring thinning every six weeks. She has not been able to see a dermatologist due to availability issues.  She mentions occasional jaw pain and a history of low iron levels, for which she has not yet started supplements. She is currently taking thyroid  medication. She uses Zyrtec and mometasone (Nasonex) for nasal congestion and has tried Mucinex and saline for relief. She is concerned about fluid in her ears, which she associates with her recent cold and vertigo symptoms.         Objective:  Physical Exam: BP 106/66   Pulse 87   Temp (!) 97.3 F (36.3 C) (Temporal)  Resp 14   Ht 5' 8 (1.727 m)   Wt 212 lb 9.6 oz (96.4 kg)   LMP 08/04/2012   SpO2 98%   BMI 32.33 kg/m   Gen: No acute distress, resting comfortably HEENT: TMs clear effusion bilaterally. Neuro: Cranial nerves intact.  Moves all extremities spontaneously.  No ataxia or other gross abnormalities.  Dix-Hallpike deferred. Psych: Normal affect and thought content  Time Spent: 40 minutes of total time was spent on the  date of the encounter performing the following actions: chart review prior to seeing the patient including recent visits, obtaining history, performing a medically necessary exam, counseling on the treatment plan, placing orders, and documenting in our EHR.        Worth HERO. Kennyth, MD 09/06/2024 2:18 PM

## 2024-09-06 NOTE — Telephone Encounter (Signed)
 Pt was told Kennyth would order RX: Astelin  today.  But this was never ordered. Please advise

## 2024-09-06 NOTE — Telephone Encounter (Signed)
 Sent prescription for Astelin  Nasal Spray to [facility] for patient. Per Dr. Shaune notes from today's visit and after speaking to Dr. Katrinka after hours, OK to send prescription as a verbal order.

## 2024-09-06 NOTE — Patient Instructions (Signed)
 It was very nice to see you today!  VISIT SUMMARY: During your visit, we discussed your vertigo, hair thinning, and nasal congestion. We have made some adjustments to your treatment plan to help manage these issues.  YOUR PLAN: BENIGN PAROXYSMAL VERTIGO: You are experiencing vertigo, which is a sensation of the room spinning, triggered by certain head movements. -Continue performing Epley maneuvers as needed. -Use Astelin  nasal spray to help with congestion and fluid drainage. -If symptoms persist, consider vestibular rehabilitation.  FEMALE PATTERN HAIR LOSS: You have noticed significant hair thinning, particularly a widening part and a patch at the back of your head. -Apply topical minoxidil 5% once daily. -We will order blood tests to check your iron, B12, folate, and vitamin D  levels. -Avoid biotin supplements due to your thyroid  medication.  EUSTACHIAN TUBE DYSFUNCTION WITH MIDDLE EAR EFFUSION: You have fluid in your middle ear likely due to a recent upper respiratory infection, contributing to your vertigo and nasal congestion. -Use Astelin  nasal spray to help with fluid drainage and congestion. -Continue using Zyrtec and mometasone nasal sprays.  Return if symptoms worsen or fail to improve.   Take care, Dr Kennyth  PLEASE NOTE:  If you had any lab tests, please let us  know if you have not heard back within a few days. You may see your results on mychart before we have a chance to review them but we will give you a call once they are reviewed by us .   If we ordered any referrals today, please let us  know if you have not heard from their office within the next week.   If you had any urgent prescriptions sent in today, please check with the pharmacy within an hour of our visit to make sure the prescription was transmitted appropriately.   Please try these tips to maintain a healthy lifestyle:  Eat at least 3 REAL meals and 1-2 snacks per day.  Aim for no more than 5 hours  between eating.  If you eat breakfast, please do so within one hour of getting up.   Each meal should contain half fruits/vegetables, one quarter protein, and one quarter carbs (no bigger than a computer mouse)  Cut down on sweet beverages. This includes juice, soda, and sweet tea.   Drink at least 1 glass of water with each meal and aim for at least 8 glasses per day  Exercise at least 150 minutes every week.

## 2024-09-06 NOTE — Telephone Encounter (Signed)
 Taken care of

## 2024-09-12 ENCOUNTER — Ambulatory Visit: Payer: Self-pay | Admitting: Family Medicine

## 2024-09-12 DIAGNOSIS — E538 Deficiency of other specified B group vitamins: Secondary | ICD-10-CM

## 2024-09-12 DIAGNOSIS — E559 Vitamin D deficiency, unspecified: Secondary | ICD-10-CM

## 2024-09-12 NOTE — Telephone Encounter (Signed)
 Read by [name] at 9:16AM on 09/12/2024. Called and spoke to patient and reviewed the lab results dictated by Dr. Kennyth. Patient verbalized understanding. Patient did question the treatment plan for the vitamin D  as her last prescription expressed one capsule weekly. Please advise on the vitamin D  treatment plan. Labs have been ordered for lab collect in the future.   Can we please schedule a lab visit for patient to come back and have her labs redrawn per providers request. Please and thank you.

## 2024-09-12 NOTE — Progress Notes (Signed)
 Her vitamin D  is still low.  Recommend she take at least 5000 IUs daily if she is not doing so already.  She should have this rechecked in 3 months-her PCP can follow-up on this.  Her cholesterol is much better than last month.  She can continue management per her PCP.  Her B12 is a little on the lower side.  Recommend she take 1000 mcg daily if she is not doing so already.  She should have this rechecked in 3 months with her PCP.  All of her other labs are at goal.

## 2024-10-17 ENCOUNTER — Encounter: Payer: Medicare Other | Admitting: Nurse Practitioner

## 2024-10-18 ENCOUNTER — Encounter: Payer: Self-pay | Admitting: Psychiatry

## 2024-10-18 ENCOUNTER — Telehealth: Admitting: Psychiatry

## 2024-10-18 DIAGNOSIS — F409 Phobic anxiety disorder, unspecified: Secondary | ICD-10-CM

## 2024-10-18 DIAGNOSIS — G2581 Restless legs syndrome: Secondary | ICD-10-CM

## 2024-10-18 DIAGNOSIS — F411 Generalized anxiety disorder: Secondary | ICD-10-CM

## 2024-10-18 DIAGNOSIS — F422 Mixed obsessional thoughts and acts: Secondary | ICD-10-CM

## 2024-10-18 DIAGNOSIS — F4001 Agoraphobia with panic disorder: Secondary | ICD-10-CM | POA: Diagnosis not present

## 2024-10-18 DIAGNOSIS — F5105 Insomnia due to other mental disorder: Secondary | ICD-10-CM

## 2024-10-18 DIAGNOSIS — F330 Major depressive disorder, recurrent, mild: Secondary | ICD-10-CM

## 2024-10-18 DIAGNOSIS — F40248 Other situational type phobia: Secondary | ICD-10-CM | POA: Diagnosis not present

## 2024-10-18 MED ORDER — CLONAZEPAM 1 MG PO TABS: ORAL_TABLET | ORAL | 3 refills | Status: AC
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
## 2024-10-24 ENCOUNTER — Encounter: Payer: Medicare Other | Admitting: Nurse Practitioner

## 2024-10-24 ENCOUNTER — Telehealth: Payer: Self-pay | Admitting: Internal Medicine

## 2024-10-24 NOTE — Telephone Encounter (Signed)
 That is fine

## 2024-10-24 NOTE — Telephone Encounter (Signed)
 Good afternoon Dr. Avram,  I received a call from this patient requesting to transfer her care over to doctor McGreal. Patient wants to be able to have [facility] as one of her PA's. Would you please advise on her transfer of care.     Thank you.

## 2024-10-25 NOTE — Telephone Encounter (Signed)
 Good morning Dr. Suzann,   I received a call from this patient requesting to transfer her care from Dr. Avram over to you due to her wanting to see Alan Coombs. Would you please advise on this patient transfer of care.    Thank you.

## 2024-10-28 ENCOUNTER — Telehealth: Admitting: Family Medicine

## 2024-10-28 ENCOUNTER — Encounter: Payer: Self-pay | Admitting: Family Medicine

## 2024-10-28 ENCOUNTER — Ambulatory Visit: Admitting: Family Medicine

## 2024-10-28 DIAGNOSIS — G2581 Restless legs syndrome: Secondary | ICD-10-CM | POA: Diagnosis not present

## 2024-10-28 DIAGNOSIS — E538 Deficiency of other specified B group vitamins: Secondary | ICD-10-CM

## 2024-10-28 DIAGNOSIS — H8113 Benign paroxysmal vertigo, bilateral: Secondary | ICD-10-CM | POA: Diagnosis not present

## 2024-10-28 DIAGNOSIS — E66811 Obesity, class 1: Secondary | ICD-10-CM | POA: Diagnosis not present

## 2024-10-28 DIAGNOSIS — Z6832 Body mass index (BMI) 32.0-32.9, adult: Secondary | ICD-10-CM

## 2024-10-28 DIAGNOSIS — L659 Nonscarring hair loss, unspecified: Secondary | ICD-10-CM

## 2024-10-28 DIAGNOSIS — J069 Acute upper respiratory infection, unspecified: Secondary | ICD-10-CM | POA: Diagnosis not present

## 2024-10-28 MED ORDER — ZEPBOUND 2.5 MG/0.5ML ~~LOC~~ SOAJ: 2.5000 mg | SUBCUTANEOUS | 0 refills | Status: AC

## 2024-10-28 NOTE — Patient Instructions (Addendum)
 It was very nice to see you today!  Try the B12 again-if makes you sick, try the dissolvable B12.   Referral to Dr. Shona  Sending the zepbound    PLEASE NOTE:  If you had any lab tests please let us  know if you have not heard back within a few days. You may see your results on MyChart before we have a chance to review them but we will give you a call once they are reviewed by us . If we ordered any referrals today, please let us  know if you have not heard from their office within the next week.   Please try these tips to maintain a healthy lifestyle:  Eat most of your calories during the day when you are active. Eliminate processed foods including packaged sweets (pies, cakes, cookies), reduce intake of potatoes, white bread, white pasta, and white rice. Look for whole grain options, oat flour or almond flour.  Each meal should contain half fruits/vegetables, one quarter protein, and one quarter carbs (no bigger than a computer mouse).  Cut down on sweet beverages. This includes juice, soda, and sweet tea. Also watch fruit intake, though this is a healthier sweet option, it still contains natural sugar! Limit to 3 servings daily.  Drink at least 1 glass of water with each meal and aim for at least 8 glasses per day  Exercise at least 150 minutes every week.

## 2024-10-28 NOTE — Progress Notes (Unsigned)
 " MyChart Video Visit Virtual Visit via Video Note   This visit type was conducted w/patient consent. This format is felt to be most appropriate for this patient at this time. Physical exam was limited by quality of the video and audio technology used for the visit. CMA was able to get the patient set up on a video visit.  Patient location: Home. Patient and provider in visit Provider location: Office  I discussed the limitations of evaluation and management by telemedicine and the availability of in person appointments. The patient expressed understanding and agreed to proceed.  Visit Date: 10/28/2024  Today's healthcare provider: Jenkins CHRISTELLA Carrel, MD     Subjective:    Patient ID: Susan Bartlett, female    DOB: Jun 10, 1966, 59 y.o.   MRN: [id number]  Chief Complaint  Patient presents with   Hypothyroidism   Hyperlipidemia    Pt wants to discuss chronic issues    Discussed the use of AI scribe software for clinical note transcription with the patient, who gave verbal consent to proceed.  History of Present Illness Susan Bartlett is a 59 year old female with benign positional vertigo who presents with dizziness and respiratory symptoms.  She experiences dizziness, which she is unsure if it is related to her known benign positional vertigo or recent respiratory symptoms. The dizziness began approximately 15 minutes prior to the conversation. She has a history of benign positional vertigo, which she reports had gotten better until about 15 minutes prior to the conversation when she became dizzy again.  She reports a recent onset of respiratory symptoms following exposure to cold weather while helping her sister move. She describes having wheezing and a persistent headache since the previous night. No fever. She has been using mucus relief medication, ibuprofen , and Vicks vapor rub to manage her symptoms. She also reports wheezing when coughing and at bedtime and states that she uses an albuterol   inhaler. She reports that she was prescribed azelastine  nasal spray and inquires about its use with Nasacort .  She mentions a significant hair loss issue, particularly at the crown of her head, which has been worsening. She previously consulted with Dr. Kennyth in November regarding this issue. She is not currently on semaglutide  and notes a weight increase to 223 pounds.  She discusses her medication regimen, including taking vitamin D  and B12 supplements. She reports low vitamin D  and B12 levels, with B12 making her feel shaky. She has started taking Centrum Silver. She is considering resuming low-dose naltrexone for inflammation, which she previously obtained from a compounding pharmacy.  She mentions a history of restless leg syndrome and inquires if B12 supplementation could help, although it is unlikely to be the cause. She also reports an increase in her sertraline  dosage to 300 mg daily, which was adjusted after her last visit with Dr. Kennyth.    Past Medical History:  Diagnosis Date   Anal fissure 1994   Anxiety    Arthritis    Dr[facility] Arnaldo at [facility]   Depression    Endometriosis    Fracture 08/2011   fracture left wrist and elbow   Hemorrhoids 2008   Hepatomegaly    Hypothyroidism    Mixed hyperlipidemia    Obesity    OCD (obsessive compulsive disorder)    Panic disorder    Plantar fasciitis, right 03/2014   Prediabetes 01/16/2015   RLS (restless legs syndrome) 2014   vis sleep study- no sleep apnea   Shingles 07/2017  Tobacco abuse    not tolerated Chantix  in the past   Vitamin D  deficiency     Past Surgical History:  Procedure Laterality Date   CHOLECYSTECTOMY     HEMORROIDECTOMY     LEFT OOPHORECTOMY     TONSILLECTOMY      Outpatient Medications Prior to Visit  Medication Sig Dispense Refill   albuterol  (VENTOLIN  HFA) 108 (90 Base) MCG/ACT inhaler INHALE 2 PUFFS BY MOUTH EVERY 6 HOURS AS NEEDED FOR WHEEZING OR SHORTNESS OF BREATH 18 g 2    atorvastatin  (LIPITOR) 20 MG tablet Take 1 tablet (20 mg total) by mouth daily. 90 tablet 3   azelastine  (ASTELIN ) 0.1 % nasal spray Place 2 sprays into both nostrils 2 (two) times daily. Use in each nostril as directed 30 mL 12   cetirizine (ZYRTEC) 10 MG chewable tablet Chew 10 mg by mouth daily.     Cholecalciferol (VITAMIN D3) 1.25 MG (50000 UT) CAPS Take 1 capsule by mouth once a week 12 capsule 0   clonazePAM  (KLONOPIN ) 1 MG tablet TAKE 1/2 TABLET BY MOUTH TWICE DAILY AND 1 TABLET AT NIGHT AS NEEDED FOR ANXIETY 60 tablet 3   Coenzyme Q10 50 MG CAPS as directed Orally     cyanocobalamin  (VITAMIN B12) 1000 MCG tablet Take 1,000 mcg by mouth daily.     ferrous sulfate 325 (65 FE) MG EC tablet Take 325 mg by mouth 3 (three) times daily with meals.     levothyroxine  (SYNTHROID ) 150 MCG tablet Take  1 tablet  Daily  on an empty stomach with only water for 30 minutes & no Antacid meds, Calcium  or Magnesium for 4 hours & avoid Biotin       /       TAKE      BY       MOUTH 90 tablet 0   MAGNESIUM GLYCINATE PO 1-2 by mouth daily     meclizine  (ANTIVERT ) 25 MG tablet Take 1 tablet (25 mg total) by mouth 3 (three) times daily as needed for dizziness. 30 tablet 1   mometasone (NASONEX) 50 MCG/ACT nasal spray Place 2 sprays into the nose daily.     Omega-3 Fatty Acids (FISH OIL) 1200 MG CAPS Take by mouth daily.     Phenylephrine-APAP-guaiFENesin (TYLENOL SINUS SEVERE PO) Take by mouth.     sertraline  (ZOLOFT ) 100 MG tablet TAKE 2 TABLETS IN THE MORNING AND 1 TABLET AT NIGHT (Patient taking differently: TAKE 1 TABLETS IN THE MORNING AND 2 TABLET AT NIGHT) 270 tablet 1   sodium chloride  (OCEAN) 0.65 % SOLN nasal spray Place 1 spray into both nostrils as needed for congestion.     triamcinolone  (NASACORT ) 55 MCG/ACT AERO nasal inhaler Place 2 sprays into the nose daily. (Patient taking differently: Place 2 sprays into the nose daily. Pt reports PRN only)     varenicline  (CHANTIX ) 0.5 MG tablet Take 1 tablet  (0.5 mg total) by mouth 2 (two) times daily. 60 tablet 1   Semaglutide -Weight Management (WEGOVY ) 0.25 MG/0.5ML SOAJ Inject 0.25 mg into the skin once a week. (Patient taking differently: Inject 0.25 mg into the skin once a week. Pt taking 5mg  weekly) 2 mL 2   No facility-administered medications prior to visit.    Allergies[1]      Objective:     Physical Exam  Vitals and nursing note reviewed.  Constitutional:      General:  is not in acute distress.    Appearance: Normal appearance.  HENT:  Head: Normocephalic.  Pulmonary:     Effort: No respiratory distress.  Skin:    General: Skin is dry.     Coloration: Skin is not pale.  Neurological:     Mental Status: Pt is alert and oriented to person, place, and time.  Psychiatric:        Mood and Affect: Mood normal.   LMP 08/04/2012   Wt Readings from Last 3 Encounters:  09/06/24 212 lb 9.6 oz (96.4 kg)  08/09/24 211 lb (95.7 kg)  07/26/24 213 lb (96.6 kg)       Assessment & Plan:   Problem List Items Addressed This Visit       Other   B12 deficiency   RLS (restless legs syndrome)   Other Visit Diagnoses       Hair loss    -  Primary   Relevant Orders   Ambulatory referral to Dermatology     Benign paroxysmal positional vertigo due to bilateral vestibular disorder         Viral upper respiratory tract infection         Class 1 obesity due to excess calories with serious comorbidity and body mass index (BMI) of 32.0 to 32.9 in adult       Relevant Medications   tirzepatide  (ZEPBOUND ) 2.5 MG/0.5ML Pen       Assessment and Plan Assessment & Plan Acute upper respiratory infection   She presents with cough, headache, and wheezing, likely due to recent cold exposure and physical exertion. Differential includes flu, COVID-19, or common cold, but no fever is reported. Recommend over-the-counter flu and COVID-19 tests. Use albuterol  inhaler as needed for wheezing.  Benign paroxysmal positional vertigo   She  experienced a recent episode of dizziness, possibly exacerbated by anxiety or the upper respiratory infection. Previous improvement noted with exercises. Continue vertigo exercises. Use azelastine  nasal spray and Nasacort  as needed. Use meclizine  as needed for dizziness.  Nonscarring hair loss   Progressive hair loss at the crown of her head. Referral to dermatologist Dr. Shona is scheduled for February 9th. Ensure referral is processed and appointment is reserved. Lab unrealing  Vitamin B12 deficiency   Low B12 levels contribute to fatigue. Previous intolerance to B12 capsules caused shakiness. Discussed alternative forms of B12 supplementation. Start B12 1000 mcg daily. Consider dissolvable B12 if capsules cause shakiness.  Class 1 obesity   Her weight has increased to 223 pounds. Previous use of semaglutide  was discontinued. Discussed potential use of Zepbound  for weight management, noting insurance coverage issues. Sent prescription for Zepbound  to pharmacy. Monitor insurance approval. Consider alternative weight loss medications if Zepbound  is not approved.      Meds ordered this encounter  Medications   tirzepatide  (ZEPBOUND ) 2.5 MG/0.5ML Pen    Sig: Inject 2.5 mg into the skin once a week.    Dispense:  2 mL    Refill:  0    I discussed the assessment and treatment plan with the patient. The patient was provided an opportunity to ask questions and all were answered. The patient agreed with the plan and demonstrated an understanding of the instructions.   The patient was advised to call back or seek an in-person evaluation if the symptoms worsen or if the condition fails to improve as anticipated.  Return in about 2 months (around 12/26/2024) for chronic follow-up.  Jenkins CHRISTELLA Carrel, MD [facility] [phone] (phone) [phone] (fax)  [facility]      [  1] No Known Allergies  "

## 2024-10-31 ENCOUNTER — Encounter: Payer: Self-pay | Admitting: Physician Assistant

## 2024-11-15 ENCOUNTER — Telehealth: Payer: Self-pay

## 2024-11-15 DIAGNOSIS — L659 Nonscarring hair loss, unspecified: Secondary | ICD-10-CM

## 2024-11-15 NOTE — Telephone Encounter (Signed)
 Please advise on referral to ENT please and thank you.   Copied from CRM #[id number]. Topic: Referral - Request for Referral >> Nov 15, 2024  1:38 PM Alexandria E wrote: Did the patient discuss referral with their provider in the last year? Yes (If No - schedule appointment) (If Yes - send message)  Appointment offered? No  Type of order/referral and detailed reason for visit: Routine visits / vertigo flare-ups  Preference of office, provider, location: [facility]  If referral order, have you been seen by this specialty before? Yes (If Yes, this issue or another issue? When? Where?  Can we respond through MyChart? Yes

## 2024-11-15 NOTE — Addendum Note (Signed)
 Addended by: Svea Pusch, CREE A on: 11/15/2024 03:25 PM   Modules accepted: Orders

## 2024-11-29 ENCOUNTER — Ambulatory Visit: Admitting: Physician Assistant

## 2024-12-21 ENCOUNTER — Ambulatory Visit (HOSPITAL_BASED_OUTPATIENT_CLINIC_OR_DEPARTMENT_OTHER): Admitting: Obstetrics & Gynecology

## 2025-08-21 ENCOUNTER — Ambulatory Visit
